# Patient Record
Sex: Female | Born: 1935 | Race: White | Hispanic: No | Smoking: Never smoker
Health system: Southern US, Community
[De-identification: ages and names within clinical notes are randomized; demographics above are authoritative.]

## PROBLEM LIST (undated history)

## (undated) DIAGNOSIS — D171 Benign lipomatous neoplasm of skin and subcutaneous tissue of trunk: Secondary | ICD-10-CM

## (undated) DIAGNOSIS — I639 Cerebral infarction, unspecified: Secondary | ICD-10-CM

## (undated) DIAGNOSIS — I10 Essential (primary) hypertension: Secondary | ICD-10-CM

## (undated) DIAGNOSIS — K635 Polyp of colon: Secondary | ICD-10-CM

## (undated) DIAGNOSIS — J45909 Unspecified asthma, uncomplicated: Secondary | ICD-10-CM

## (undated) DIAGNOSIS — E079 Disorder of thyroid, unspecified: Secondary | ICD-10-CM

## (undated) DIAGNOSIS — F0390 Unspecified dementia without behavioral disturbance: Secondary | ICD-10-CM

## (undated) DIAGNOSIS — E119 Type 2 diabetes mellitus without complications: Secondary | ICD-10-CM

## (undated) DIAGNOSIS — E43 Unspecified severe protein-calorie malnutrition: Secondary | ICD-10-CM

## (undated) DIAGNOSIS — Z800 Family history of malignant neoplasm of digestive organs: Secondary | ICD-10-CM

## (undated) DIAGNOSIS — R601 Generalized edema: Secondary | ICD-10-CM

## (undated) DIAGNOSIS — R609 Edema, unspecified: Secondary | ICD-10-CM

## (undated) DIAGNOSIS — K439 Ventral hernia without obstruction or gangrene: Secondary | ICD-10-CM

## (undated) DIAGNOSIS — H9209 Otalgia, unspecified ear: Secondary | ICD-10-CM

## (undated) DIAGNOSIS — I2510 Atherosclerotic heart disease of native coronary artery without angina pectoris: Secondary | ICD-10-CM

## (undated) DIAGNOSIS — I6529 Occlusion and stenosis of unspecified carotid artery: Secondary | ICD-10-CM

## (undated) DIAGNOSIS — I6523 Occlusion and stenosis of bilateral carotid arteries: Secondary | ICD-10-CM

## (undated) DIAGNOSIS — R079 Chest pain, unspecified: Secondary | ICD-10-CM

## (undated) HISTORY — PX: CAROTID ARTERY ANGIOPLASTY: SHX1300

## (undated) HISTORY — PX: CHOLECYSTECTOMY: SHX55

## (undated) HISTORY — PX: CARPAL TUNNEL RELEASE: SHX101

## (undated) HISTORY — PX: HEMORRHOID SURGERY: SHX153

## (undated) HISTORY — PX: LIPOMA EXCISION: SHX5283

---

## 2009-07-12 LAB — GLUCOSE TOLERANCE TEST: Glucose Accuchek: 123 MG/DL

## 2009-12-16 ENCOUNTER — Encounter

## 2009-12-16 NOTE — Patient Instructions (Signed)
Schedule CT abd/pelvis - acute pain    CMP, Amylase/Lipase today    Follow - up with THeath NP 2-3 days after ct

## 2009-12-16 NOTE — Progress Notes (Signed)
Subjective:      Patient ID: Valerie Nicholson is a 76 y.o. female.    HPI  Patient seen for complaints of abdominal pain, acute and generalized.  She also reports "hearing" her abdomen.  Last c-scope 07/12/09 revealed sigmoid diverticulosis, she has a history of carpeting polyp mid ascending colon without recurrence.  Last endo 12/31/08 revealed negative urea, gastritis and continued known Barrett's disease, short segment.    Family history: grandfather, mother, brother colon cancer    Review of Systems   Constitutional: Positive for appetite change. Negative for fever, chills, activity change, fatigue and unexpected weight change.   HENT: Negative for hearing loss, sore throat, rhinorrhea, trouble swallowing, dental problem and sinus pressure.    Eyes: Negative for visual disturbance.   Respiratory: Negative for cough, chest tightness, shortness of breath and wheezing.    Cardiovascular: Negative for chest pain, palpitations and leg swelling.   Gastrointestinal: Positive for abdominal pain (generalized, acute; worse after eating). Negative for nausea, vomiting, diarrhea, constipation, blood in stool, abdominal distention and anal bleeding.   Genitourinary: Negative for dysuria, urgency, frequency, hematuria and difficulty urinating.   Musculoskeletal: Positive for back pain, arthralgias and gait problem. Negative for myalgias and joint swelling.   Skin: Negative for color change and rash.   Neurological: Negative for dizziness, tremors, seizures, weakness, numbness and headaches.   Hematological: Does not bruise/bleed easily.   Psychiatric/Behavioral: Negative for confusion. The patient is not nervous/anxious.        Objective:   Physical Exam   Constitutional: She is oriented to person, place, and time. Vital signs are normal. She appears well-developed and well-nourished. She is cooperative. No distress.   HENT:   Head: Normocephalic and atraumatic.   Eyes: Conjunctivae and EOM are normal. Pupils are equal, round,  and reactive to light. No scleral icterus.   Neck: Trachea normal and normal range of motion. Neck supple. No JVD present.   Cardiovascular: Normal rate, regular rhythm, normal heart sounds, intact distal pulses and normal pulses.  Exam reveals no gallop.    No murmur heard.  Pulmonary/Chest: Effort normal and breath sounds normal. No respiratory distress. She has no wheezes. She has no rales.   Abdominal: Soft. Bowel sounds are normal. She exhibits no distension and no mass. There is no splenomegaly or hepatomegaly. Tenderness (diffuse) is present. She has no rebound and no guarding.   Musculoskeletal: Normal range of motion. She exhibits no edema.        Walks with walker   Neurological: She is alert and oriented to person, place, and time.   Skin: Skin is warm, dry and intact. No lesion and no rash noted. She is not diaphoretic.   Psychiatric: She has a normal mood and affect. Her speech is normal and behavior is normal. Her mood appears not anxious.       Assessment:      1. Abdominal pain, acute, generalized            Plan:      Schedule CT abd/pelvis - acute pain    CMP, Amylase/Lipase today    Follow - up with THeath NP 2-3 days after ct

## 2009-12-19 LAB — LIPASE: Lipase: 28.9 U/L (ref 12.0–53.0)

## 2009-12-19 LAB — COMPREHENSIVE PANEL
ALT: 15 IU/L (ref 7–35)
AST: 21 IU/L (ref 8–41)
Albumin: 4.2 G/DL (ref 3.4–4.8)
Alk Phos: 97 IU/L (ref 18–125)
Anion Gap: 8 MEQ/L (ref 7–19)
BUN: 12 MG/DL (ref 6–20)
CO2: 30 MEQ/L — ABNORMAL HIGH (ref 23–29)
Calcium: 8.3 MG/DL — ABNORMAL LOW (ref 8.5–10.3)
Chloride: 99 MEQ/L (ref 98–111)
Creatinine: 0.8 MG/DL (ref 0.3–1.3)
Gfr Calculated: 74 mL/min (ref >59)
Glucose: 141 MG/DL
Potassium: 4.2 mEq/L (ref 3.5–5.0)
Sodium: 137 MEQ/L (ref 136–145)
Total Bilirubin: 0.5 MG/DL (ref 0.3–1.2)
Total Protein: 7.4 G/DL (ref 6.4–8.3)

## 2009-12-19 LAB — CREATININE, BODY FLUID: Creatinine: 0.8 mg/dL (ref 0.3–1.3)

## 2009-12-19 LAB — AMYLASE: Amylase: 67 U/L (ref 20–104)

## 2009-12-21 LAB — CREATININE: Creatinine: 0.7 MG/DL (ref 0.3–1.3)

## 2009-12-21 LAB — BUN: BUN: 10 MG/DL (ref 6–20)

## 2009-12-23 NOTE — Patient Instructions (Addendum)
Refer to surgeon - cholelithiasis and 17cm ventral hernia, patient prefers Dr. Dimas Aguas    Colon recall 06/2012    Endo recall 2013    Encouraged the use of Miralax, increase water and fiber intake    Call with problems before then

## 2009-12-23 NOTE — Progress Notes (Signed)
Subjective:      Patient ID: Valerie Nicholson is a 76 y.o. female.    HPI  Patient seen for complaints of abdominal pain, acute and generalized.  Patient has stopped taking Nexium as she feels it is related to her abdominal pain.  She does have Barrett's esophagus, explained to patient the importance of continuing PPI, she voiced understanding.  She also complains of constipation.  Last c-scope 07/12/09 revealed sigmoid diverticulosis, she has a history of carpeting polyp mid ascending colon without recurrence, 3 year recall recommended. Last endo 12/31/08 revealed negative urea, gastritis and continued known Barrett's disease, short segment, 3 year recall recommended.    CT abd/pelvis 12/19/09 revealed cholelithiasis and a large ventral hernia, 17cm.  CMP revealed low calcium 8.3, the remainder wnl, amylase/lipase normal.    Family history: mother, brothers x 2, grandfather colon cancer    Review of Systems   Constitutional: Negative for fever, chills, activity change, appetite change, fatigue and unexpected weight change.   HENT: Negative for hearing loss, sore throat, rhinorrhea, trouble swallowing, dental problem and sinus pressure.    Eyes: Negative for visual disturbance.   Respiratory: Negative for cough, chest tightness, shortness of breath and wheezing.    Cardiovascular: Negative for chest pain, palpitations and leg swelling.   Gastrointestinal: Positive for abdominal pain and constipation. Negative for nausea, vomiting, diarrhea, blood in stool, abdominal distention and anal bleeding.   Genitourinary: Negative for dysuria, urgency, frequency, hematuria and difficulty urinating.   Musculoskeletal: Positive for back pain and gait problem. Negative for myalgias, joint swelling and arthralgias.   Skin: Negative for color change and rash.   Neurological: Positive for weakness. Negative for dizziness, tremors, seizures, numbness and headaches.   Hematological: Does not bruise/bleed easily.   Psychiatric/Behavioral:  Negative for confusion. The patient is nervous/anxious.        Objective:   Physical Exam   Constitutional: She is oriented to person, place, and time. Vital signs are normal. She appears well-developed and well-nourished. She is cooperative. No distress.        obese   HENT:   Head: Normocephalic and atraumatic.   Eyes: Conjunctivae and EOM are normal. Pupils are equal, round, and reactive to light. No scleral icterus.   Neck: Trachea normal and normal range of motion. Neck supple. No JVD present.   Cardiovascular: Normal rate, regular rhythm, normal heart sounds, intact distal pulses and normal pulses.  Exam reveals no gallop.    No murmur heard.  Pulmonary/Chest: Effort normal and breath sounds normal. No respiratory distress. She has no wheezes. She has no rales.   Abdominal: Soft. Bowel sounds are normal. She exhibits no distension and no mass. There is no splenomegaly or hepatomegaly. No tenderness. She has no rebound and no guarding.   Musculoskeletal: Normal range of motion. She exhibits no edema.        Uses a walker to ambulate   Neurological: She is alert and oriented to person, place, and time.   Skin: Skin is warm, dry and intact. No lesion and no rash noted. She is not diaphoretic.   Psychiatric: She has a normal mood and affect. Her speech is normal and behavior is normal. Her mood appears not anxious.        Extreme concern regarding possibility of colon cancer, due to family history.       Assessment:      1. Abdominal pain    2. Cholelithiasis    3. Barrett esophagus  4.   Constipation        Plan:      Refer to surgeon - cholelithiasis and 17cm ventral hernia, patient prefers Dr. Dimas Aguas    Colon recall 06/2012    Endo recall 2013    Encouraged the use of Miralax, increase water and fiber intake    Call with problems before then

## 2009-12-28 NOTE — Telephone Encounter (Signed)
Patient called Dr Penni Bombard office, referral not done.      Will schedule referral asap for cholelithiasis

## 2010-01-11 NOTE — Progress Notes (Signed)
Subjective:      Patient ID: Valerie Nicholson is a 76 y.o. female.    HPI  [name] is a pleasant 76 year old female who is referred by Dr Beckey Downing for surgical of gallbladder disease.  She presents with symptomatic gallbladder disease.  She has had a CT scan that confirmed gallstones and a 7cm umbilical hernia.  She has a host of medical problems to include heart and lung disease.  We will ask her cardiologist Dr. Chase Picket to see her and also refer her to Dr Estanislado Pandy for pulmonary evaluation.      Patient Active Problem List   Diagnoses Date Noted   . Family history of colon cancer 12/16/2009   . Abdominal pain, acute, generalized 12/16/2009     No current outpatient prescriptions on file.     Allergies: Darvocet; Meloxicam; Morphine; Pcn; Septra; Ultracet; Zanaflex; and Fentanyl    Past Medical History   Diagnosis Date   . Lipoma    . Colon polyps    . Hyperthyroidism    . HH (hiatus hernia)    . HYPERTENSION    . Barrett's esophagus    . Allergic rhinitis    . DM w/o complication type II    . Osteoarthritis    . TIA (transient ischemic attack)    . Asthma    . Obesity    . Spinal stenosis      Past Surgical History   Procedure Date   . Carpal tunnel release    . Hemorrhoid surgery    . Knee arthroscopy      BILATERAL   . Cataract removal    . Colonoscopy 06/2009   . Upper gastrointestinal endoscopy 12/2008     Family History   Problem Relation Age of Onset   . Cancer Mother    . Colon Cancer Mother    . Cancer Brother    . Colon Cancer Brother    . Cancer Maternal Aunt    . Emphysema Father    . Heart Failure Father      History   Substance Use Topics   . Smoking status: Former Games developer   . Smokeless tobacco: Not on file    Comment: Stopped 1955   . Alcohol Use: No       Review of Systems   Constitutional: Positive for appetite change and unexpected weight change. Negative for fever, chills and diaphoresis.   HENT: Negative for hearing loss, nosebleeds, facial swelling, sneezing, neck pain and tinnitus.     Eyes: Negative for photophobia and discharge.   Respiratory:        [Positive for asthma and she sees Dr Luciana Axe.  Cardiovascular:        [Positive for heart disease under the care of Dr Chase Picket.  Neurological: Negative for dizziness, seizures, light-headedness and headaches.         Objective:   Physical Exam   Constitutional: No distress.   HENT:   Head: Normocephalic.   Eyes: No scleral icterus.   Neck: Neck supple. No JVD present.   Cardiovascular: Normal rate and regular rhythm.    Pulmonary/Chest: Breath sounds normal. No respiratory distress. She exhibits no tenderness.   Abdominal: Soft.        Positive for a reducible umbilical hernia.   Musculoskeletal: Normal range of motion. She exhibits no edema and no tenderness.   Lymphadenopathy:     She has no cervical adenopathy.       Assessment:  Cholecystitis and cholelithiasis      Plan:      We will get her into see cardiology and pulmonary prior to surgery.

## 2010-01-12 ENCOUNTER — Encounter

## 2010-01-12 NOTE — Telephone Encounter (Signed)
01/12/10 9:24am-Spoke w/Jeri w/ Dr Chase Picket.  Requested cardiac clearance prior to surgery.  She said she would send request to the nurse and they would contact our office.  I also left a message with Dr Landry Dyke appt sec requesting a new pt appt for pulmonary clearance prior to surgery.

## 2010-01-18 LAB — BLOOD GAS, ARTERIAL
Base: 2.8 — ABNORMAL HIGH (ref <=2.0)
CALCULATED TOTAL (E+NE): 12 g/dL (ref 12.0–16.0)
Carboxyhemoglobin: 2.2 % (ref 0–5)
FIO2: 0.21
HCO3: 27.2 mM/L — ABNORMAL HIGH (ref 22–26)
Met HB: 0.6 % (ref 0–3)
O2 Sat: 16 % — ABNORMAL LOW (ref 17–21)
PCO2: 40 mmHg (ref 35–48)
Ph: 7.44 (ref 7.35–7.45)
Potassium, Bld: 3.8 mEq/L (ref 3.5–5.0)
Rate: 20
Sat: 95.8 % (ref 90–100)
pO2: 75 mmHg — ABNORMAL LOW (ref 83–108)

## 2010-01-24 NOTE — Progress Notes (Signed)
[facility], [place]  [address], [place]  [address]  Phone: [phone]  Fax: [phone]    OFFICE VISIT:  01/24/2010    Valerie Nicholson - DOB: 12/23/1930    Reason For Visit:  Valerie Nicholson is a 76 y.o. female who is here for Surgical Consult      Subjective  Valerie Nicholson denies exertional chest pain but states she still has her "spot" in her chest that hurts when she is upset.  She has shortness of breath that is better.  Uses mechanical bed but not orthopnea, paroxysmal nocturnal dyspnea, syncope, presyncope, arrythmia, edema and fatigue.  The patient denies numbness or weakness to suggest cerebrovascular accident or transient ischemic attack.  She is having daily abdominal pain.    Valerie Nicholson has the following history as recorded in [employer]:    Patient Active Problem List   Diagnoses Date Noted   . Aortic stenosis 01/24/2010   . HYPERTENSION    . DM w/o complication type II    . Family history of colon cancer 12/16/2009   . Abdominal pain, acute, generalized 12/16/2009     Past Medical History   Diagnosis Date   . Lipoma    . Colon polyps    . Hyperthyroidism    . HH (hiatus hernia)    . HYPERTENSION    . Barrett's esophagus    . Allergic rhinitis    . DM w/o complication type II    . Osteoarthritis    . TIA (transient ischemic attack)    . Asthma    . Obesity    . Spinal stenosis    . Aortic stenosis 01/24/2010     Past Surgical History   Procedure Date   . Carpal tunnel release    . Hemorrhoid surgery    . Knee arthroscopy      BILATERAL   . Cataract removal    . Colonoscopy 06/2009   . Upper gastrointestinal endoscopy 12/2008     Family History   Problem Relation Age of Onset   . Cancer Mother    . Colon Cancer Mother    . Cancer Brother    . Colon Cancer Brother    . Cancer Maternal Aunt    . Emphysema Father    . Heart Failure Father      History   Substance Use Topics   . Smoking status: Former Games developer   . Smokeless tobacco: Not on file    Comment: Stopped 1955   . Alcohol  Use: No      Current Outpatient Prescriptions   Medication Sig Dispense Refill   . budesonide-formoterol (SYMBICORT) 160-4.5 MCG/ACT AERO Inhale 2 puffs into the lungs 2 times daily.         Marland Kitchen esomeprazole Magnesium (NEXIUM) 40 MG PACK 40 mg daily.         Marland Kitchen GLIPIZIDE PO Take 5 mg by mouth 2 times daily.       . ATENOLOL PO Take 50 mg by mouth 2 times daily.       . Olmesartan Medoxomil (BENICAR PO) Take  by mouth.         . Clopidogrel Bisulfate (PLAVIX PO) Take 75 mg by mouth daily.       . Atorvastatin Calcium (LIPITOR PO) Take  by mouth.         . Bumetanide (BUMEX PO) Take  by mouth.         Marland Kitchen  Montelukast Sodium (SINGULAIR PO) Take  by mouth.         . METFORMIN HCL PO Take  by mouth.         . Methimazole (TAPAZOLE PO) Take  by mouth.         . Loratadine (CLARITIN PO) Take  by mouth.         . ASPIRIN PO Take 81 mg by mouth daily.       Marland Kitchen CALCIUM PO Take  by mouth.         . Multiple Vitamin (MULTIVITAMIN PO) Take  by mouth.         . Pirbuterol Acetate (MAXAIR AUTOHALER IN) Inhale  into the lungs as needed.       . Olopatadine HCl (PATANASE NA) by Nasal route.           Allergies: Darvocet; Meloxicam; Morphine; Pcn; Septra; Ultracet; Zanaflex; and Fentanyl    Objective  Vital Signs - BP 168/80  Pulse 64  Ht 5' (1.524 m)  Wt 212 lb (96.163 kg)  BMI 41.40 kg/m2  General - Valerie Nicholson is alert, cooperative, and pleasant.  No acute distress.   Obese, using walker   HEENT - The head is normocephalic.  Neck - Supple, without increased jugular venous pressures.  No carotid bruits.    Lungs - Lungs are clear bilaterally.  No wheezes or rales.    Cardiovascular - Heart has regular rate and rhythm.  No murmurs, rubs or gallops.      Extremities - No clubbing, cyanosis, or edema.   Musculoskeletal - No musculoskeletal symptoms.     Neurological - No focal signs are identified. Cranial nerves II through XII are grossly intact.    Assessment:    1. Aortic stenosis  Echocardiogram pharmacological stress test, Echo Complete    2. HYPERTENSION  Echocardiogram pharmacological stress test   3. DM w/o complication type II  Echocardiogram pharmacological stress test    Stable cardiovascular status. No evidence of overt heart failure, angina or dysrhythmia.   Needing surgical clearance for upcoming hernia and cholecystectomy - needs soon.    Plan  1. Dobutamine stress echo and 2D echo set for 01-27-10 at [facility]  2. Cardiac clearance pending results.  3. We will send results to PCP and Dr. Dimas Aguas  4. Return to see Dr. Chase Picket in 6 months.  5. Continue same medications.  6. Call with any problems, questions or concerns.  7. Continue to see primary care provider for non cardiac concerns and labs.  8.     Valerie Bruckner, APRN

## 2010-01-24 NOTE — Patient Instructions (Addendum)
Tests to be done at [facility] on Friday 01-27-10 at 8AM  [facility] at 7:30 to register  Nothing to eat or drink after midnight  Take normal medications with water. HOLD the ATENOLOL that morning however.   We will send results to Dr. Dimas Aguas and your primary doctor.     We will see you back in May as scheduled with Dr. Chase Picket  Continue same medications  Call for problems or concerns.

## 2010-01-30 NOTE — Telephone Encounter (Signed)
Valerie Nicholson calling to say she had a chemical stress test Friday.  Friday night she noted a red spot on her left hand below the ring finger and put hydrocortisone cream on it.  Later she had left arm and shoulder pain.  Saturday her right arm began to hurt and up into her neck and Sunday it was in both arms and began to have constant throbbing in neck and head.  Denies having elevated temperature and states she has sharp pain in neck and head when she tries to move it.  Advised her to see her family physician.  Voiced understanding.

## 2010-02-01 NOTE — Progress Notes (Signed)
Patient ID: Valerie Nicholson is a 76 y.o. female.   HPI   Valerie Nicholson is a pleasant 76 year old female who is referred by Dr Beckey Downing for surgical evaluation of gallbladder disease. She has seen Dr Tresa Endo and Dr Chase Picket preop and she has been cleared for surgery.  We discussed the risk, benefits and alternatives of surgery and she wishes to proceed.     Patient Active Problem List    Diagnoses  Date Noted    .  Family history of colon cancer  12/16/2009    .  Abdominal pain, acute, generalized  12/16/2009      No current outpatient prescriptions on file.      Allergies: Darvocet; Meloxicam; Morphine; Pcn; Septra; Ultracet; Zanaflex; and Fentanyl     Past Medical History    Diagnosis  Date    .  Lipoma     .  Colon polyps     .  Hyperthyroidism     .  HH (hiatus hernia)     .  HYPERTENSION     .  Barrett's esophagus     .  Allergic rhinitis     .  DM w/o complication type II     .  Osteoarthritis     .  TIA (transient ischemic attack)     .  Asthma     .  Obesity     .  Spinal stenosis       Past Surgical History    Procedure  Date    .  Carpal tunnel release     .  Hemorrhoid surgery     .  Knee arthroscopy       BILATERAL    .  Cataract removal     .  Colonoscopy  06/2009    .  Upper gastrointestinal endoscopy  12/2008      Family History    Problem  Relation  Age of Onset    .  Cancer  Mother     .  Colon Cancer  Mother     .  Cancer  Brother     .  Colon Cancer  Brother     .  Cancer  Maternal Aunt     .  Emphysema  Father     .  Heart Failure  Father       History    Substance Use Topics    .  Smoking status:  Former Games developer    .  Smokeless tobacco:  Not on file       Comment: Stopped 1955      .  Alcohol Use:  No      Review of Systems   Constitutional: Positive for appetite change and unexpected weight change. Negative for fever, chills and diaphoresis.   HENT: Negative for hearing loss, nosebleeds, facial swelling, sneezing, neck pain and tinnitus.   Eyes: Negative for photophobia and discharge.   Respiratory:    [Positive for asthma and she sees Dr Luciana Axe- cleared for surgery per Dr Tresa Endo.   Cardiovascular:   [Positive for heart disease under the care of Dr Ellin Saba recent work up   Neurological: Negative for dizziness, seizures, light-headedness and headaches.     Objective:    Physical Exam   Constitutional: No distress.   HENT:   Head: Normocephalic.   Eyes: No scleral icterus.   Neck: Neck supple. No JVD present.   Cardiovascular: Normal rate and regular rhythm.   Pulmonary/Chest: Breath  sounds normal. No respiratory distress. She exhibits no tenderness.   Abdominal: Soft.   Positive for a reducible umbilical hernia.   Musculoskeletal: Normal range of motion. She exhibits no edema and no tenderness.   Lymphadenopathy:   She has no cervical adenopathy.     Assessment:      Cholecystitis and cholelithiasis   Umbilical hernia    Plan:      We will schedule her for surgery on 02/14/10.

## 2010-02-07 LAB — COMPREHENSIVE METABOLIC PANEL
ALT: 14 IU/L (ref 7–35)
AST: 23 IU/L (ref 8–41)
Albumin: 4.2 G/DL (ref 3.4–4.8)
Alkaline Phosphatase: 87 IU/L (ref 18–125)
Anion Gap: 9 MEQ/L (ref 7–19)
BUN: 12 MG/DL (ref 6–20)
CO2: 29 MEQ/L (ref 23–29)
Calcium: 8.9 MG/DL (ref 8.5–10.3)
Chloride: 99 MEQ/L (ref 98–111)
Creatinine: 0.7 MG/DL (ref 0.3–1.3)
Est, Glom Filt Rate: 86 mL/min (ref >59)
Glucose: 158 MG/DL
Potassium, Fluid: 4.6 MEQ/L (ref 3.6–5.0)
Sodium: 137 MEQ/L (ref 136–145)
Total Bilirubin: 0.5 MG/DL (ref 0.3–1.2)
Total Protein: 7.4 G/DL (ref 6.4–8.3)

## 2010-02-07 LAB — CBC WITH AUTO DIFFERENTIAL
Basophils %: 0.6 % (ref 0–1)
Basophils: 0.04 10*3/uL (ref 0.0–0.20)
Eosinophils %: 0.32 10*3/uL (ref 0.0–0.60)
Eosinophils %: 5 % (ref 1–5)
Hematocrit: 36.4 % — ABNORMAL LOW (ref 37–47)
Hemoglobin: 11.9 G/DL — ABNORMAL LOW (ref 12.0–16.0)
Lymphocytes: 2.1 10*3/uL (ref 1.1–4.5)
Lymphocytes: 32.7 % (ref 20–40)
MCH: 28 PG (ref 27–31)
MCHC: 32.7 G/DL — ABNORMAL LOW (ref 33–37)
MCV: 85.6 FL (ref 81–99)
MPV: 10.1 FL — ABNORMAL LOW (ref 10.2–13.2)
Monocytes %: 7.3 % (ref 0–10)
Monocytes: 0.47 10*3/uL (ref 0.0–0.9)
Neutrophils %: 54.4 % (ref 50–70)
Neutrophils Absolute: 3.5 10*3/uL (ref 1.5–7.5)
Platelet Count: 313 (ref 130–400)
RBC: 4.25 MIL/uL (ref 4.2–5.4)
RDW: 13.3 % (ref 11.0–14.0)
WBC: 6.43 10*3/uL (ref 4.8–10.8)

## 2010-02-08 ENCOUNTER — Encounter

## 2010-02-08 MED ORDER — MUPIROCIN 2 % EX OINT
2 % | CUTANEOUS | Status: AC
Start: 2010-02-08 — End: 2010-02-15

## 2010-02-14 ENCOUNTER — Inpatient Hospital Stay: Admit: 2010-02-14 | Disposition: A | Discharge status: Home / Self Care | Attending: Surgery | Admitting: Surgery

## 2010-02-14 LAB — GLUCOSE TOLERANCE TEST
Glucose Accuchek: 131 MG/DL
Glucose Accuchek: 199 MG/DL
Glucose Accuchek: 213 MG/DL

## 2010-02-15 LAB — GLUCOSE TOLERANCE TEST
Glucose Accuchek: 140 MG/DL
Glucose Accuchek: 145 MG/DL

## 2010-02-15 LAB — CBC PANEL ITEM
Basophils %: 0.1 % (ref 0–1)
Basophils: 0.01 10*3/uL (ref 0.0–0.20)
Eosinophils %: 0.29 10*3/uL (ref 0.0–0.60)
Eosinophils %: 3.3 % (ref 1–5)
Hematocrit: 29.1 % — ABNORMAL LOW (ref 37–47)
Hgb: 9.6 G/DL — ABNORMAL LOW (ref 12.0–16.0)
Lymphocytes: 1.69 10*3/uL (ref 1.1–4.5)
Lymphocytes: 19.3 % — ABNORMAL LOW (ref 20–40)
MCH: 27.8 PG (ref 27–31)
MCHC: 33 G/DL (ref 33–37)
MCV: 84.3 FL (ref 81–99)
MPV: 10.1 FL — ABNORMAL LOW (ref 10.2–13.2)
Monocytes %: 9.3 % (ref 0–10)
Monocytes: 0.81 10*3/uL (ref 0.0–0.9)
Neutrophils %: 68 % (ref 50–70)
Neutrophils Absolute: 5.94 10*3/uL (ref 1.5–7.5)
Platelet Count: 212 (ref 130–400)
RBC: 3.45 MIL/uL — ABNORMAL LOW (ref 4.2–5.4)
RDW: 13.8 % (ref 11.0–14.0)
WBC: 8.74 10*3/uL (ref 4.8–10.8)

## 2010-02-15 LAB — BASIC METABOLIC PANEL
Anion Gap: 5 MEQ/L — ABNORMAL LOW (ref 7–19)
BUN: 12 MG/DL (ref 6–20)
CO2: 30 MEQ/L — ABNORMAL HIGH (ref 23–29)
Calcium: 7.8 MG/DL — ABNORMAL LOW (ref 8.5–10.3)
Chloride: 96 MEQ/L — ABNORMAL LOW (ref 98–111)
Creatinine: 0.7 MG/DL (ref 0.3–1.3)
Gfr Calculated: 86 mL/min (ref >59)
Glucose: 155 MG/DL
Potassium: 3.9 mEq/L (ref 3.5–5.0)
Sodium: 131 MEQ/L — ABNORMAL LOW (ref 136–145)

## 2010-02-16 LAB — GLUCOSE TOLERANCE TEST
Glucose Accuchek: 186 MG/DL
Glucose Accuchek: 83 MG/DL

## 2010-03-02 NOTE — Progress Notes (Signed)
Valerie Nicholson presents after incisional hernia repair and laparoscopic cholecystectomy.  The  Patient is doing well without complaints.    On examination, her incisions are healing well and she has no complaints.  We removed her staples and steri-strips were applied to the midline incision.      IMPRESSION:  Doing well s/p incisional hernia repair and cholecystectomy    PLAN:  We will see her back in 2-3 weeks.

## 2010-03-03 NOTE — Telephone Encounter (Signed)
error 

## 2010-03-16 NOTE — Progress Notes (Signed)
HISTORY OF PRESENT ILLNESS:    Valerie Nicholson  is one month status post laparoscopic cholecystectomy and umbilical hernia.  She is doing well and not having any significant problems.  The wounds are healing nicely.  She is having no major problems with  bowel movements.  I will just need to see her  back as needed.  She can resume normal lifting at six weeks post operatively.

## 2010-03-17 ENCOUNTER — Encounter

## 2010-03-17 MED ORDER — OLMESARTAN MEDOXOMIL 40 MG PO TABS
40 MG | ORAL_TABLET | Freq: Every day | ORAL | Status: DC
Start: 2010-03-17 — End: 2010-08-09

## 2010-06-21 ENCOUNTER — Encounter

## 2010-08-09 ENCOUNTER — Encounter

## 2010-08-09 MED ORDER — OLMESARTAN MEDOXOMIL 40 MG PO TABS
40 MG | ORAL_TABLET | Freq: Every day | ORAL | Status: DC
Start: 2010-08-09 — End: 2010-11-15

## 2010-09-06 NOTE — Patient Instructions (Signed)
Return for wellness visit in six months and return to see me in a year.  Continue your current medications.  Call our office should you have any concerns or cardiac problems prior to your next appointment.    Judith Blonder. Chase Picket, M.D., Ph.D., F.A.C.C.

## 2010-09-08 NOTE — Progress Notes (Signed)
[facility]  [facility]  [address], [place]  [address]  Phone: [phone]  Fax: [phone]    OFFICE VISIT:  09/06/10    Valerie Nicholson - 77 y.o. [birth year]    Chief Complaint   Patient presents with   . 6 Month Follow-Up     s/p tumor removal and cholecystectomy last October.  Concerned about area on lower leg.  Has occ. sharp pain left chest that she feels can't move with , has had for years but has worsened over the last month.        Subjective:   Valerie Nicholson presents today for routine follow-up.  She is doing okay.  There is no angina, no overt heart failure, no syncope, and no stroke-like problems.      Patient Active Problem List   Diagnoses Date Noted   . Aortic stenosis 01/24/2010   . HYPERTENSION    . DM w/o complication type II    . Family history of colon cancer 12/16/2009   . Abdominal pain, acute, generalized 12/16/2009       Current Outpatient Prescriptions   Medication Sig Dispense Refill   . olmesartan (BENICAR) 40 MG tablet Take 1 tablet by mouth daily.  90 tablet  3   . budesonide-formoterol (SYMBICORT) 160-4.5 MCG/ACT AERO Inhale 2 puffs into the lungs 2 times daily.         Marland Kitchen esomeprazole Magnesium (NEXIUM) 40 MG PACK 40 mg daily.         Marland Kitchen GLIPIZIDE PO Take 5 mg by mouth 2 times daily.       . ATENOLOL PO Take 50 mg by mouth 2 times daily.       Marland Kitchen Clopidogrel Bisulfate (PLAVIX PO) Take 75 mg by mouth daily.       . Atorvastatin Calcium (LIPITOR PO) Take 40 mg by mouth.       . Bumetanide (BUMEX PO) Take 5 mg by mouth.       . Montelukast Sodium (SINGULAIR PO) Take  by mouth.         . METFORMIN HCL PO Take 500 mg by mouth.       . Methimazole (TAPAZOLE PO) Take  by mouth.         . Loratadine (CLARITIN PO) Take  by mouth.         . ASPIRIN PO Take 81 mg by mouth daily.       Marland Kitchen CALCIUM PO Take  by mouth.         . Multiple Vitamin (MULTIVITAMIN PO) Take  by mouth.         . Pirbuterol Acetate (MAXAIR AUTOHALER IN) Inhale   into the lungs as needed.       . Olopatadine HCl (PATANASE NA) by Nasal route.             Allergies: Darvocet; Meloxicam; Morphine; Pcn; Septra; Ultracet; Zanaflex; and Fentanyl      Past Medical History   Diagnosis Date   . Lipoma    . Colon polyps    . Hyperthyroidism    . HH (hiatus hernia)    . Hypertension    . Barrett's esophagus    . Allergic rhinitis    . Type II or unspecified type diabetes mellitus without mention of complication, not stated as uncontrolled    . Osteoarthritis    . TIA (transient ischemic attack)    . Asthma    .  Obesity    . Spinal stenosis    . Aortic stenosis 01/24/2010   . Shortness of breath    . Chest pain    . Transient cerebral ischemia        Past Surgical History   Procedure Date   . Carpal tunnel release    . Hemorrhoid surgery    . Knee arthroscopy      BILATERAL   . Cataract removal    . Colonoscopy 06/2009   . Upper gastrointestinal endoscopy 12/2008   . Appendectomy    . Nasal septum surgery      repair   . Cholecystectomy        Family History   Problem Relation Age of Onset   . Cancer Mother    . Colon Cancer Mother    . Cancer Brother    . Colon Cancer Brother    . Cancer Maternal Aunt    . Emphysema Father    . Heart Failure Father        History   Substance Use Topics   . Smoking status: Former Games developer   . Smokeless tobacco: Not on file    Comment: Stopped 1955   . Alcohol Use: No       Objective:   BP 132/70  Pulse 60  Ht 5' (1.524 m)  Wt 207 lb (93.895 kg)  BMI 40.43 kg/m2  General - Valerie Nicholson is alert, cooperative, and pleasant.  No apparent distress.      HEENT - The head is normocephalic.  Neck - The neck is supple without increased jugular venous pressures.  No carotid bruits.    Lungs - The lungs are clear.      Cardiovascular - The rhythm is regular without appreciated murmur or rub.      Abdominal -  The abdomen is soft, non-tender, non-distended.  Bowel sounds are intact.   Extremities - There is no clubbing, cyanosis, or edema.   Musculoskeletal - No  musculoskeletal symptoms.     Neurological - No focal signs are identified.  Cranial nerves II through XII are grossly intact.    Assessment:   Stable cardiovascular status.     Plan:   1.  Continue same medications.   2.  Return for wellness visit in six months and see me in a year.  3.  She is to call our office should she have any concerns or cardiac problems prior to her next appointment.    Valerie Blonder. Chase Nicholson, M.D., Ph.D., F.A.C.C.    Cc:   Valerie Nicholson  [address]  [place] [address]  [phone] office  [phone] fax

## 2010-11-15 ENCOUNTER — Encounter

## 2010-11-15 MED ORDER — OLMESARTAN MEDOXOMIL 40 MG PO TABS
40 MG | ORAL_TABLET | Freq: Every day | ORAL | Status: DC
Start: 2010-11-15 — End: 2011-03-01

## 2011-03-01 ENCOUNTER — Encounter

## 2011-03-01 MED ORDER — OLMESARTAN MEDOXOMIL 40 MG PO TABS
40 MG | ORAL_TABLET | Freq: Every day | ORAL | Status: DC
Start: 2011-03-01 — End: 2012-04-02

## 2011-03-15 NOTE — Patient Instructions (Signed)
Monitor BP occasional- overalll goal at rest <130/80    Use same scale at same time every day to monitor weight    Follow up in 6mos With Dr. Chase Picket  Call with any questions or concerns  Follow up with Dr. Loura Back for non cardiac problems and your labs  Report any new problems  Cardiovascular Fitness- beaas active as possible.   Cardiac / Healthy Diet  Continue current medications as directed  Continue plan of treatment

## 2011-03-15 NOTE — Progress Notes (Signed)
[facility] [place]  [address], [place]  [address]  Phone: [phone]  Fax: [phone]    OFFICE VISIT:  03/15/2011    Valerie Nicholson - DOB: 06-Dec-1930    Reason For Visit:  Valerie Nicholson is a 77 y.o. female who is here for 6 Month Follow-Up and Hypertension      Subjective  Valerie Nicholson denies exertional chest pain, shortness of breath, orthopnea, paroxysmal nocturnal dyspnea, syncope, presyncope, arrythmia, edema and fatigue.  The patient denies numbness or weakness to suggest cerebrovascular accident or transient ischemic attack.    She had thyroid  Ablation in Sept.  She sees Dr. Loura Back next week with labs in follow up.    She has dry eyes and hurt all the time.  She had one episode of visual changes one morning and continues for a while  BP has been more controlled - 134/82 last check.       Valerie Nicholson has the following history as recorded in EpicCare:    Patient Active Problem List   Diagnoses Date Noted   . Aortic stenosis 01/24/2010   . HYPERTENSION    . DM w/o complication type II    . Family history of colon cancer 12/16/2009   . Abdominal pain, acute, generalized 12/16/2009     Past Medical History   Diagnosis Date   . Lipoma    . Colon polyps    . Hyperthyroidism    . HH (hiatus hernia)    . Hypertension    . Barrett's esophagus    . Allergic rhinitis    . Type II or unspecified type diabetes mellitus without mention of complication, not stated as uncontrolled    . Osteoarthritis    . TIA (transient ischemic attack)    . Asthma    . Obesity    . Spinal stenosis    . Aortic stenosis 01/24/2010   . Shortness of breath    . Chest pain    . Transient cerebral ischemia      Past Surgical History   Procedure Date   . Carpal tunnel release    . Hemorrhoid surgery    . Knee arthroscopy      BILATERAL   . Cataract removal    . Colonoscopy 06/2009   . Upper gastrointestinal endoscopy 12/2008   . Appendectomy    . Nasal septum surgery      repair   . Cholecystectomy      Family  History   Problem Relation Age of Onset   . Cancer Mother    . Colon Cancer Mother    . Cancer Brother    . Colon Cancer Brother    . Cancer Maternal Aunt    . Emphysema Father    . Heart Failure Father      History   Substance Use Topics   . Smoking status: Former Games developer   . Smokeless tobacco: Not on file    Comment: Stopped 1955   . Alcohol Use: No      Current Outpatient Prescriptions   Medication Sig Dispense Refill   . magnesium oxide (MAG-OX) 400 MG tablet Take 400 mg by mouth 2 times daily.         Marland Kitchen olmesartan (BENICAR) 40 MG tablet Take 1 tablet by mouth daily.  30 tablet  5   . budesonide-formoterol (SYMBICORT) 160-4.5 MCG/ACT AERO Inhale 2 puffs into the lungs 2 times daily.         Marland Kitchen  esomeprazole Magnesium (NEXIUM) 40 MG PACK 40 mg daily.         Marland Kitchen GLIPIZIDE PO Take 5 mg by mouth 2 times daily.       . ATENOLOL PO Take 50 mg by mouth 2 times daily.       Marland Kitchen Clopidogrel Bisulfate (PLAVIX PO) Take 75 mg by mouth daily.       . Atorvastatin Calcium (LIPITOR PO) Take 40 mg by mouth.       . Bumetanide (BUMEX PO) Take 5 mg by mouth.       . Montelukast Sodium (SINGULAIR PO) Take  by mouth.         . METFORMIN HCL PO Take 500 mg by mouth.       . Methimazole (TAPAZOLE PO) Take  by mouth.         . Loratadine (CLARITIN PO) Take  by mouth.         . ASPIRIN PO Take 81 mg by mouth daily.       Marland Kitchen CALCIUM PO Take  by mouth.         . Multiple Vitamin (MULTIVITAMIN PO) Take  by mouth.         . Pirbuterol Acetate (MAXAIR AUTOHALER IN) Inhale  into the lungs as needed.       . Olopatadine HCl (PATANASE NA) by Nasal route.           Allergies: Darvocet; Meloxicam; Morphine; Pcn; Septra; Ultracet; Zanaflex; and Fentanyl    Objective  Vital Signs - BP 166/80  Pulse 62  Ht 5' (1.524 m)  Wt 215 lb (97.523 kg)  BMI 41.99 kg/m2  General - Valerie Nicholson is alert, cooperative, and pleasant.  No acute distress.      HEENT - The head is normocephalic.  Neck - Supple, without increased jugular venous pressures.  No carotid bruits.     Lungs - Lungs are clear bilaterally.  No wheezes or rales.    Cardiovascular - Heart has regular rhythm and rate.  No murmurs, rubs or gallops.      Abdominal -  Soft, nontender, nondistended.  Bowel sounds are intact.   Extremities - No clubbing, cyanosis, or  edema.   Musculoskeletal - No musculoskeletal symptoms.  Using walker with slow cautious gait.      Neurological - No focal signs are identified. Cranial nerves II through XII are grossly intact.    Assessment:    1. Hypertension    2. Aortic stenosis    3. DM w/o complication type II      Stable cardiovascular status. No evidence of overt heart failure, angina or dysrhythmia.   Negative Dobutamine SE 9/11.  Pinpoint sharp chest pain with [place] of inhaler non cardiac.   BP elevated today but reports normal at Dr. Blanchie Serve office.    Occasional dizziness- has not had carotid's checked- will check with Dr Loura Back to see if he has checked.   Plan  Monitor BP occasional- overalll goal at rest <130/80    Use same scale at same time every day to monitor weight    Follow up in 6mos With Dr. Chase Picket  Call with any questions or concerns  Follow up with Dr. Loura Back for non cardiac problems and your labs  Report any new problems  Cardiovascular Fitness- beaas active as possible.   Cardiac / Healthy Diet  Continue current medications as directed  Continue plan of treatment    Octavia Bruckner, APRN

## 2011-05-24 NOTE — Telephone Encounter (Signed)
The pt called wanting to know if there was anything she could take that would be cheaper than Benicar 40 mg.  Stated she was in the doughnut hole by May last year and was trying to avoid this.

## 2011-05-24 NOTE — Telephone Encounter (Signed)
She is on max of Benicar and others won't be as effective as the Benicar.  No samples right now but we can try to get her some.  If she has to switch due to cost, go to Diovan 320mg  - it is now generic.

## 2011-05-25 NOTE — Telephone Encounter (Signed)
Contacted the pt who stated she would stay on the Benicar at least 30 days and let us know if she wanted to change to the Diovan 320 after checking with the pharmacist on cost.

## 2011-05-31 LAB — CREATININE, BODY FLUID: Creatinine: 0.7 mg/dL (ref 0.3–1.3)

## 2011-06-11 NOTE — Telephone Encounter (Signed)
Valerie Nicholson called requesting results for tests done on 05/31/11, ordered by Dr. Verlin Dike.  Informed her that she would have to obtain those results from the ordering doctor.

## 2011-06-21 NOTE — Progress Notes (Addendum)
History and Physical    She presents for evaluation of carotid artery stenosis. She reports that she has been having neck pain and had a recent CTA neck.  Her current treatment includes ASA 81 mg po qd, clopidogrel 75 mg po qd.She denies a history of CVA. She reports no TIA's, episodes of amaurosis fugax and episodes of lateralizing weakness.    Valerie Nicholson is a 78 y.o. female with the following history reviewed and recorded in EpicCare:  Patient Active Problem List    Diagnosis Date Noted   . Carotid artery stenosis 06/21/2011   . Aortic stenosis 01/24/2010   . HYPERTENSION    . DM w/o complication type II    . Family history of colon cancer 12/16/2009     PT HAS STRONG FAMILY HISTORY OF CLN CA (MOM, GRANDFATHER, 2 BROTHERS, COUSINS SON)     . Abdominal pain, acute, generalized 12/16/2009     Current Outpatient Prescriptions   Medication Sig Dispense Refill   . magnesium oxide (MAG-OX) 400 MG tablet Take 400 mg by mouth 2 times daily.         Marland Kitchen olmesartan (BENICAR) 40 MG tablet Take 1 tablet by mouth daily.  30 tablet  5   . budesonide-formoterol (SYMBICORT) 160-4.5 MCG/ACT AERO Inhale 2 puffs into the lungs 2 times daily.         Marland Kitchen esomeprazole Magnesium (NEXIUM) 40 MG PACK 40 mg daily.         Marland Kitchen GLIPIZIDE PO Take 5 mg by mouth 2 times daily.       . ATENOLOL PO Take 50 mg by mouth 2 times daily.       Marland Kitchen Clopidogrel Bisulfate (PLAVIX PO) Take 75 mg by mouth daily.       . Atorvastatin Calcium (LIPITOR PO) Take 40 mg by mouth.       . Bumetanide (BUMEX PO) Take 5 mg by mouth.       . Montelukast Sodium (SINGULAIR PO) Take  by mouth.         . METFORMIN HCL PO Take 500 mg by mouth.       . Methimazole (TAPAZOLE PO) Take  by mouth.         . Loratadine (CLARITIN PO) Take  by mouth.         . ASPIRIN PO Take 81 mg by mouth daily.       Marland Kitchen CALCIUM PO Take  by mouth.         . Multiple Vitamin (MULTIVITAMIN PO) Take  by mouth.         . Pirbuterol Acetate (MAXAIR AUTOHALER IN) Inhale  into the lungs as needed.        . Olopatadine HCl (PATANASE NA) by Nasal route.           No current facility-administered medications for this visit.     Allergies: Darvocet; Meloxicam; Morphine; Pcn; Septra; Ultracet; Zanaflex; and Fentanyl  Past Medical History   Diagnosis Date   . Lipoma    . Colon polyps    . Hyperthyroidism    . HH (hiatus hernia)    . Hypertension    . Barrett's esophagus    . Allergic rhinitis    . Type II or unspecified type diabetes mellitus without mention of complication, not stated as uncontrolled    . Osteoarthritis    . TIA (transient ischemic attack)    . Asthma    . Obesity    .  Spinal stenosis    . Aortic stenosis 01/24/2010   . Shortness of breath    . Chest pain    . Transient cerebral ischemia    . Chronic back pain    . DVT (deep venous thrombosis)    . Hyperlipidemia    . Hypothyroidism    . Neuropathy    . Barrett esophagus    . Carotid artery stenosis 06/21/2011     Past Surgical History   Procedure Laterality Date   . Carpal tunnel release     . Hemorrhoid surgery     . Knee arthroscopy       BILATERAL   . Cataract removal     . Colonoscopy  06/2009   . Upper gastrointestinal endoscopy  12/2008   . Appendectomy     . Nasal septum surgery       repair   . Cholecystectomy       Family History   Problem Relation Age of Onset   . Cancer Mother    . Colon Cancer Mother    . Cancer Brother    . Colon Cancer Brother    . Cancer Maternal Aunt    . Emphysema Father    . Heart Failure Father      History   Substance Use Topics   . Smoking status: Former Smoker -- 1.00 packs/day for 20 years   . Smokeless tobacco: Never Used    Comment: Stopped 1955   . Alcohol Use: No         Old records have been obtained from the referring provider.  These records have been reviewed and summarized.      Review of Systems    Constitutional - no significant activity change, appetite change, or unexpected weight change.  No fever or chills.  No diaphoresis or significant fatigue.  HENT - no significant rhinorrhea or epistaxis.  No  tinnitus or significant hearing loss.  Eyes - no sudden vision change or amaurosis.  Respiratory - no significant shortness of breath, wheezing, or stridor.  No apnea, cough, or chest tightness associated with shortness of breath.  Cardiovascular - no chest pain, syncope, or significant dizziness.  No palpitations or significant leg swelling.  No claudication.  Gastrointestinal - no abdominal swelling or pain.  No blood in stool.  No severe constipation, diarrhea, nausea, or vomiting.  Genitourinary - No difficulty urinating, dysuria, frequency, or urgency.  No flank pain or hematuria.  Musculoskeletal - no back pain, gait disturbance, or myalgia.  Skin - no color change, rash, pallor, or new wound.  Neurologic - no dizziness, facial asymmetry, or light headedness.  No seizures.  No speech difficulty or lateralizing weakness.  Hematologic - no easy bruising or excessive bleeding.  Psychiatric - no severe anxiety or nervousness.  No confusion.  All other review of systems are negative.    Physical Exam    BP 198/90  Pulse 78  Temp(Src) 98.2 F (36.8 C)  Resp 16    Constitutional - well developed, well nourished.  No diaphoresis or acute distress.  HENT - head normocephalic.  Right external ear canal appears normal.  Left external ear canal appears normal.  Septum appears midline.  Eyes - conjunctiva normal.  EOM'S normal.  No exudate.  No icterus.  Neck- ROM appears normal, no tracheal deviation.  Cardiovascular - Regular rate and rhythm.  Heart sounds are normal.  No murmur, rub, or gallop.  Carotid pulses are 2+ to palpation bilaterally without  bruit.    Extremities - Radial and brachial pulses are 2+ to palpation bilaterally.  No cyanosis, clubbing, or significant edema.  No signs atheroembolic event.  Pulmonary - effort appears normal.  No respiratory distress.    Lungs - Breath sounds normal. No wheezes or rales.    GI - Abdomen - soft, non tender, bowel sounds X 4 quadrants.  No guarding or rebound  tenderness.  No distension or palpable mass.  Genitourinary - deferred.  Musculoskeletal - ROM appears normal.  No significant edema.  Neurologic - alert and oriented X 3.  Physiologic.  Tongue midline.  Face symmetric.  Skin - warm, dry, and intact.  No rash, erythema, or pallor.  Psychiatric - mood, affect, and behavior appear normal.  Judgment and thought processes appear normal.    Risk factors for atherosclerosis of all vascular beds have been reviewed with the patient including:  Family history, tobacco abuse in all forms, elevated cholesterol, hyperlipidemia, and diabetes.      Doppler results: 04/09/11    Right CCA/ICA <50% stenotic  Left CCA/ICA 50-69% stenotic  Right vertebral artery flow is antegrade  Left vertebral artery flow is antegrade  Individual velocities reviewed: Yes.  Results were reviewed with the patient.    CTA neck - 05/31/11 (reviewed per Dr. Allyne Gee)  Impression:   1. 70% stenosis of the left internal carotid artery at the level of   the carotid bulb and proximally due to calcified atherosclerosis.   There is approximately 50% stenosis on the right at the same level   also due to calcified atherosclerosis. Bilateral vertebral arteries   appear patent without significant stenosis. The visualizes   intracranial vasculature appears patent without significant stenosis.   2. Nodular enlarged thyroid gland likely a multinodular goiter.     Patient was seen by Dr. Allyne Gee      Options have been discussed with the patient including continued medical management vs left CE. We will have her see Dr. Chase Picket for cardiac clearance. Patient wishes to see Dr. Chase Picket and make her decision thereafter regarding proceeding with a left CE.    Risks of surgery have been reviewed with the patient including but not limited to MI, death, CVA, bleeding, nerve injury, infection, need for further surgery, pain, and extended recovery time.    Assessment    1. Carotid artery stenosis          Plan      She has an  appointment with  Dr. Chase Picket on 07/04/11 for cardiac clearance. After she has been seen by Cardiology I   will call her regarding her wishes to proceed with left CE vs continued medical management.  Start/Continue ASA EC 81 mg daily  Start/Continue antiplatelet med: Plavix - dose 75 mg q day  Strongly encourage start/continue statin therapy  Recommend no smoking  Patient instructed to call or proceed to the emergency room with any symptoms of lateralizing weakness, loss of vision in one eye, or episodes slurred speech.    *as of note she reports that on 2 separate occasions in the past after she has had a procedure requiring anesthesia, she had complications requiring admit to CCU *    Addendum:  Cardiac clearance obtained by Dr. Chase Picket    Proceed with a Left CE per Dr. Allyne Gee      Cc - Verlin Dike

## 2011-07-01 NOTE — Telephone Encounter (Signed)
error 

## 2011-07-04 NOTE — Patient Instructions (Signed)
 Register at the [facility] located on the first floor of [facility].       Date/Time:     Dobutamine Stress Test  A chemical stress test uses chemical agents injected into the body through the vein.  These chemicals make the heart function as if it were under stress.  A chemical stress test is used when a traditional stress test (called a cardiac stress test) cannot be done.  Testing will take approximately one hour.      Dobutamine Stress Echocardiogram Instructions:   No caffeine 24 hours prior to the testing.  This includes: coffee, pop/soda, chocolate, cold medications, etc.  Any product that might contain caffeine.    Do not eat or drink anything, except water, eight (8) hours before the test.   No alcohol or nicotine twelve (12) hours prior to your test.   Wear comfortable clothing.  If you are taking metoprolol, stop morning of this procedure.     If you need to change this appointment, please call outpatient scheduling at [phone].

## 2011-07-04 NOTE — Progress Notes (Signed)
History and Physical    Valerie Nicholson is a 78 y.o. female who is here for Cardiac Clearance  Patient is needing clearance for carotid artery surgery. The sugery is not scheduled yet. The patient states she is having no cardiac complaints and that her Bp at home and family PCP has been wnl.     Subjective  Valerie Nicholson denies exertional chest pain, shortness of breath,orthopnea, paroxysmal nocturnal dyspnea, syncope, presyncope and arrythmia.  The patient denies numbness or weakness to suggest cerebrovascular accident or transient ischemic attack.    Valerie Nicholson has the following history as recorded in EpicCare:    Patient Active Problem List    Diagnosis Date Noted   . Preop cardiovascular exam 07/04/2011   . Carotid artery stenosis 06/21/2011   . Aortic stenosis 01/24/2010   . HYPERTENSION    . DM w/o complication type II    . Family history of colon cancer 12/16/2009   . Abdominal pain, acute, generalized 12/16/2009     Past Medical History   Diagnosis Date   . Lipoma    . Colon polyps    . Hyperthyroidism    . HH (hiatus hernia)    . Hypertension    . Barrett's esophagus    . Allergic rhinitis    . Type II or unspecified type diabetes mellitus without mention of complication, not stated as uncontrolled    . Osteoarthritis    . TIA (transient ischemic attack)    . Asthma    . Obesity    . Spinal stenosis    . Aortic stenosis 01/24/2010   . Shortness of breath    . Chest pain    . Transient cerebral ischemia    . Chronic back pain    . DVT (deep venous thrombosis)    . Hyperlipidemia    . Hypothyroidism    . Neuropathy    . Barrett esophagus    . Carotid artery stenosis 06/21/2011     Past Surgical History   Procedure Laterality Date   . Carpal tunnel release     . Hemorrhoid surgery     . Knee arthroscopy       BILATERAL   . Cataract removal     . Colonoscopy  06/2009   . Upper gastrointestinal endoscopy  12/2008   . Appendectomy     . Nasal septum surgery       repair   . Cholecystectomy       Family History    Problem Relation Age of Onset   . Cancer Mother    . Colon Cancer Mother    . Cancer Brother    . Colon Cancer Brother    . Cancer Maternal Aunt    . Emphysema Father    . Heart Failure Father      History   Substance Use Topics   . Smoking status: Former Smoker -- 1.00 packs/day for 20 years   . Smokeless tobacco: Never Used    Comment: Stopped 1955   . Alcohol Use: No      Current Outpatient Prescriptions   Medication Sig Dispense Refill   . atenolol (TENORMIN) 50 MG tablet Take 50 mg by mouth 2 times daily.       Marland Kitchen atorvastatin (LIPITOR) 80 MG tablet Take 80 mg by mouth daily.       . clopidogrel (PLAVIX) 75 MG tablet Take 75 mg by mouth daily.       Marland Kitchen  glipiZIDE (GLUCOTROL) 5 MG tablet Take 5 mg by mouth daily.       . metformin (GLUCOPHAGE-XR) 500 MG XR tablet Take 500 mg by mouth daily.       . montelukast (SINGULAIR) 10 MG tablet Take 10 mg by mouth Daily.       . magnesium oxide (MAG-OX) 400 MG tablet Take 400 mg by mouth 2 times daily.         Marland Kitchen olmesartan (BENICAR) 40 MG tablet Take 1 tablet by mouth daily.  30 tablet  5   . budesonide-formoterol (SYMBICORT) 160-4.5 MCG/ACT AERO Inhale 2 puffs into the lungs 2 times daily.         Marland Kitchen esomeprazole Magnesium (NEXIUM) 40 MG PACK 40 mg daily.         . Loratadine (CLARITIN PO) Take  by mouth.         . ASPIRIN PO Take 81 mg by mouth daily.       Marland Kitchen CALCIUM PO Take  by mouth.         . Pirbuterol Acetate (MAXAIR AUTOHALER IN) Inhale  into the lungs as needed.       . Olopatadine HCl (PATANASE NA) by Nasal route.           No current facility-administered medications for this visit.       Allergies: Darvocet; Meloxicam; Morphine; Pcn; Septra; Ultracet; Zanaflex; and Fentanyl      Review of Systems   Constitutional: Negative for fever, chills, diaphoresis, activity change, appetite change, fatigue and unexpected weight change.   HENT: Negative for nosebleeds, facial swelling, rhinorrhea and neck stiffness.    Eyes: Negative for photophobia, pain, redness and  visual disturbance.   Respiratory: Negative for apnea, cough, chest tightness, shortness of breath, wheezing and stridor.    Cardiovascular: Negative for chest pain, palpitations and leg swelling.   Gastrointestinal: Negative for abdominal distention.   Genitourinary: Negative for dysuria, urgency and frequency.   Musculoskeletal: Negative for myalgias, arthralgias and gait problem.   Skin: Negative for color change, pallor, rash and wound.   Neurological: Negative for dizziness, tremors, speech difficulty, weakness and numbness.   Hematological: Does not bruise/bleed easily.   Psychiatric/Behavioral: Negative.      Physical Exam   BP 156/72  Pulse 64  Ht 5' (1.524 m)  Wt 216 lb (97.977 kg)  BMI 42.18 kg/m2  Constitutional: She is oriented to person, place, and time. She appears well-developed and well-nourished. No distress.   HENT:   Head: Normocephalic and atraumatic.   Eyes: EOM are normal. Pupils are equal, round, and reactive to light.   Neck: Normal range of motion. Neck supple. No JVD present.   Cardiovascular: Normal rate, regular rhythm, normal heart sounds and intact distal pulses.  Exam reveals no gallop and no friction rub.    No murmur heard.  Pulmonary/Chest: Effort normal and breath sounds normal. No respiratory distress. She has no wheezes. She has no rales. She exhibits no tenderness.   Abdominal: Bowel sounds are normal.   Musculoskeletal: Normal range of motion. She exhibits no edema.   Neurological: She is alert and oriented to person, place, and time.   Skin: Skin is warm and dry. No rash noted. She is not diaphoretic. No erythema. No pallor.   Psychiatric: She has a normal mood and affect. Her behavior is normal. Thought content normal.     ASSESSMENT    1. Hypertension    2. Aortic stenosis    3.  Carotid artery stenosis    4. Preop cardiovascular exam        PLAN    There are no Patient Instructions on file for this visit.     1. Return for office visit:  2. Continue same  medications.  3. Call with any problems, questions or concerns.  4. HTN-CPT  5. preop cardiovascular- stress test pharm with echo  6. Aortic stenosis-continue to follow  7. Cardiac education given  8. Fu as scheduled

## 2011-07-26 ENCOUNTER — Encounter

## 2011-07-26 NOTE — Telephone Encounter (Signed)
Spoke with patient, details/instructions and medications were addressed for surgery at [facility] on 08/20/11 with Dr. Allyne Gee. She will need to register at the [facility] outpatient registration at 6:00am. She will be admitted after surgery. She will need to register at the [facility] outpatient registration on 08/09/11 for pre-op test (Lab, chest x-ray, and EKG). She may register anytime between 9:00am and 3:30pm on 08/09/11. Patient verbalizes understanding of details and instructions. Written instructions were mailed to the patient.

## 2011-07-27 NOTE — Telephone Encounter (Signed)
Notified patient regarding results of 2D Echo and Dobutamine Stress Echo.  Informed clear for carotid surgery from cardiac standpoint.  Staff msg sent to Grace Bushy, RN regarding clearance.

## 2011-08-08 NOTE — Telephone Encounter (Signed)
The patient called to cancel her procedure scheduled for 08/24/11. The pt was to have a left carotid endarterectomy. The pt states that she is on her way to the ER with her husband and she doesn't know how that situation will "pan out" so she wishes to call back at a later date to r/s. Information forwarded to Lodema Hong, RN and [employer].

## 2011-09-13 NOTE — Progress Notes (Signed)
[facility] [place], [place].  [facility]  [address], [address], [place]  [address]  Phone: [phone]  Fax: [phone]  Office Visit:  09/12/2011    Valerie Nicholson  Female, 78 y.o., DOB: [birth year]     Subjective:   Valerie Nicholson presents for routine follow-up.  She has obviously been under increased stress because of Mr. Marchitto health.  Fortunately, there is no angina, no overt heart failure, no syncope, and no stroke-like problems.      Valerie Nicholson has the following history as recorded in EpicCare:  Patient Active Problem List    Diagnosis Date Noted   . Preop cardiovascular exam 07/04/2011   . Carotid artery stenosis 06/21/2011   . Aortic stenosis 01/24/2010   . HYPERTENSION    . DM w/o complication type II    . Family history of colon cancer 12/16/2009   . Abdominal pain, acute, generalized 12/16/2009     Past Medical History   Diagnosis Date   . Lipoma    . Colon polyps    . Hyperthyroidism    . HH (hiatus hernia)    . Hypertension    . Barrett's esophagus    . Allergic rhinitis    . Type II or unspecified type diabetes mellitus without mention of complication, not stated as uncontrolled    . Osteoarthritis    . TIA (transient ischemic attack)    . Asthma    . Obesity    . Spinal stenosis    . Aortic stenosis 01/24/2010   . Shortness of breath    . Chest pain    . Transient cerebral ischemia    . Chronic back pain    . DVT (deep venous thrombosis)    . Hyperlipidemia    . Hypothyroidism    . Neuropathy    . Barrett esophagus    . Carotid artery stenosis 06/21/2011     Past Surgical History   Procedure Laterality Date   . Carpal tunnel release     . Hemorrhoid surgery     . Knee arthroscopy       BILATERAL   . Cataract removal     . Colonoscopy  06/2009   . Upper gastrointestinal endoscopy  12/2008   . Appendectomy     . Nasal septum surgery       repair   . Cholecystectomy       Family History   Problem Relation Age of Onset   . Cancer Mother    . Colon Cancer  Mother    . Cancer Brother    . Colon Cancer Brother    . Cancer Maternal Aunt    . Emphysema Father    . Heart Failure Father      History   Substance Use Topics   . Smoking status: Former Smoker -- 1.00 packs/day for 20 years   . Smokeless tobacco: Never Used    Comment: Stopped 1955   . Alcohol Use: No     Allergies: Darvocet; Meloxicam; Morphine; Pcn; Septra; Ultracet; Zanaflex; and Fentanyl    Outpatient Prescriptions Marked as Taking for the 09/12/11 encounter (Office Visit) with [name], MD   Medication Sig Dispense Refill   . atenolol (TENORMIN) 50 MG tablet Take 50 mg by mouth 2 times daily.       Marland Kitchen atorvastatin (LIPITOR) 80 MG tablet Take 80 mg by mouth daily.       Marland Kitchen  clopidogrel (PLAVIX) 75 MG tablet Take 75 mg by mouth daily.       Marland Kitchen glipiZIDE (GLUCOTROL) 5 MG tablet Take 5 mg by mouth daily.       . metformin (GLUCOPHAGE-XR) 500 MG XR tablet Take 500 mg by mouth daily.       . montelukast (SINGULAIR) 10 MG tablet Take 10 mg by mouth Daily.       Marland Kitchen olmesartan (BENICAR) 40 MG tablet Take 1 tablet by mouth daily.  30 tablet  5   . budesonide-formoterol (SYMBICORT) 160-4.5 MCG/ACT AERO Inhale 2 puffs into the lungs 2 times daily.         Marland Kitchen esomeprazole Magnesium (NEXIUM) 40 MG PACK 40 mg daily.         . Loratadine (CLARITIN PO) Take  by mouth.         . ASPIRIN PO Take 81 mg by mouth daily.       Marland Kitchen CALCIUM PO Take  by mouth.         . Pirbuterol Acetate (MAXAIR AUTOHALER IN) Inhale  into the lungs as needed.       . Olopatadine HCl (PATANASE NA) by Nasal route.           Objective:   BP 160/94  Pulse 88  Ht 5\' 2"  (1.575 m)  Wt 210 lb (95.255 kg)  BMI 38.4 kg/m2    GENERAL:  The patient is a pleasant female in no apparent distress.   HEENT:  The head is normocephalic.  NECK:  Supple without increased jugular venous pressures.       LUNGS:  The lungs are clear.      HEART:  The heart's rhythm is regular without appreciable murmur or rub.   ABDOMEN:  Soft, nontender, nondistended.  Bowel sounds  are intact.   EXTREMITIES:  There is no clubbing, cyanosis, or edema.   NEUROLOGICAL:  Cranial nerves II through XII are grossly intact.      Assessment:   1.  Stable cardiovascular status.     Plan:   1.  Continue same medications.   2.  Return for wellness visit in six months and see me in a year.  3.  Call with any problems, questions or concerns.    Judith Blonder. Chase Picket, M.D., Ph.D., F.A.C.C.  [facility] [place], [place].     Copy:   Verlin Dike  [address]  [place] [address]  [phone] office  [phone] fax

## 2011-11-20 NOTE — Telephone Encounter (Signed)
Patient called stating that she had to cancel her carotid surgery back on 08/20/11 due to her husband's illness. She is still having issues with her husbands illness and doesn't want to reschedule the surgery at this time. She states that she started having pain in her neck and can feel her heart beating in her neck that keeps her up at night. She denies any AF, TIA, or lateralizing weakness. Boyd Kerbs Legereit, PA-C informed, patient still needs surgery, we will need to see her back in the office before we reschedule the surgery. Neck pain and feeling heartbeat in neck are not associated with symptoms of carotid disease. Patient informed that she still needs the carotid surgery and that we need to see her in the office before we reschedule her surgery. Informed her that neck pain and feeling heartbeat in neck is generally not associated with carotid disease but that she should call or go to the ER with any signs of visual disturbance particularly in one eye, any problems with speech, or any weakness/numbness particularly one side of the body. Patient verbalizes understanding. She states that she needs to check on a few things and will call later to schedule an appointment.

## 2012-01-07 NOTE — Progress Notes (Addendum)
Subjective:      Patient ID: Valerie Nicholson is a 78 y.o. female.    HPI  Patient is seen for EGD recall. Patient has a history of Barrett's Esophagus.  Last EGD 12/31/08 revealed negative urea, gastritis and continued known Barrett's disease, short segment, with 3 year recall.  She complains today of difficulty swallowing and constipation.  She reports dysphagia with pills, and breads, is eating slowly and chews repeatedly which helps some.  She is currently taking Pantoprazole daily with good control of reflux.   She controls chronic constipation with the use of Miralax and almonds.   She has no complaints of reflux or nausea.   Last c-scope 07/12/09 revealed sigmoid diverticulosis, she has a history of carpeting polyp mid ascending colon without recurrence, with 3 year recall.  She denies abdominal pain, blood in stool or change in bowel habits.  She has a 70% left carotid artery stenosis, has had to cancel surgery due to family issues.  Patient stable for procedure.    Family history: mother, brothers x 2, grandfather colon cancer    Past Medical History   Diagnosis Date   . Lipoma    . Colon polyps    . Hyperthyroidism    . HH (hiatus hernia)    . Hypertension    . Barrett's esophagus    . Allergic rhinitis    . Type II or unspecified type diabetes mellitus without mention of complication, not stated as uncontrolled    . Osteoarthritis    . TIA (transient ischemic attack)    . Asthma    . Obesity    . Spinal stenosis    . Aortic stenosis 01/24/2010   . Shortness of breath    . Chest pain    . Transient cerebral ischemia    . Chronic back pain    . DVT (deep venous thrombosis)    . Hyperlipidemia    . Hypothyroidism    . Neuropathy    . Carotid artery stenosis 06/21/2011   . Arthritis    . Neck pain    . Thyroid disorder      Past Surgical History   Procedure Laterality Date   . Carpal tunnel release     . Hemorrhoid surgery     . Knee arthroscopy       BILATERAL   . Cataract removal     . Colonoscopy  07-12-09     Dr  Beckey Downing   . Upper gastrointestinal endoscopy  12-31-08     Dr Beckey Downing   . Appendectomy     . Nasal septum surgery       repair   . Cholecystectomy     . Colonoscopy  11-06-05     Dr Beckey Downing   . Colonoscopy  12-17-03     Dr Beckey Downing   . Upper gastrointestinal endoscopy  12-21-03     Dr Beckey Downing   . Upper gastrointestinal endoscopy  10-30-05     Dr Beckey Downing     History     Social History   . Marital Status: Married     Spouse Name: N/A     Number of Children: N/A   . Years of Education: N/A     Social History Main Topics   . Smoking status: Former Smoker -- 1.00 packs/day for 20 years   . Smokeless tobacco: Never Used    Comment: Stopped 1955   . Alcohol Use: No   . Drug Use: No   .  Sexually Active: None     Other Topics Concern   . None     Social History Narrative   . None     Current Outpatient Prescriptions on File Prior to Visit   Medication Sig Dispense Refill   . pantoprazole (PROTONIX) 40 MG tablet Take 40 mg by mouth 2 times daily.       Marland Kitchen therapeutic multivitamin-minerals (THERAGRAN-M) tablet Take 1 tablet by mouth daily.       Marland Kitchen atenolol (TENORMIN) 50 MG tablet Take 50 mg by mouth 2 times daily.       Marland Kitchen atorvastatin (LIPITOR) 80 MG tablet Take 80 mg by mouth daily.       . clopidogrel (PLAVIX) 75 MG tablet Take 75 mg by mouth daily.       Marland Kitchen glipiZIDE (GLUCOTROL) 5 MG tablet Take 5 mg by mouth daily.       . metformin (GLUCOPHAGE-XR) 500 MG XR tablet Take 500 mg by mouth daily.       . montelukast (SINGULAIR) 10 MG tablet Take 10 mg by mouth Daily.       Marland Kitchen olmesartan (BENICAR) 40 MG tablet Take 1 tablet by mouth daily.  30 tablet  5   . budesonide-formoterol (SYMBICORT) 160-4.5 MCG/ACT AERO Inhale 2 puffs into the lungs 2 times daily.         . Loratadine (CLARITIN PO) Take 5 mg by mouth 2 times daily.       . ASPIRIN PO Take 81 mg by mouth daily.       Marland Kitchen CALCIUM PO Take 600 mg by mouth 2 times daily.       . Pirbuterol Acetate (MAXAIR AUTOHALER IN) Inhale  into the lungs as needed.       .  Olopatadine HCl (PATANASE NA) by Nasal route as needed.         No current facility-administered medications on file prior to visit.       Review of Systems   Constitutional: Negative for fever, chills, activity change, appetite change, fatigue and unexpected weight change.   HENT: Positive for trouble swallowing and voice change (cant sing anymore). Negative for hearing loss, sore throat, rhinorrhea, dental problem and sinus pressure.    Eyes: Negative for visual disturbance.   Respiratory: Negative for cough, chest tightness, shortness of breath and wheezing.    Cardiovascular: Negative for chest pain, palpitations and leg swelling.   Gastrointestinal: Positive for constipation. Negative for nausea, vomiting, abdominal pain, diarrhea, blood in stool, abdominal distention and anal bleeding.   Genitourinary: Negative for dysuria, urgency, frequency, hematuria and difficulty urinating.   Musculoskeletal: Positive for back pain, joint swelling and arthralgias. Negative for myalgias.   Skin: Negative for color change and rash.   Neurological: Negative for dizziness, tremors, seizures, weakness, numbness and headaches.   Hematological: Does not bruise/bleed easily.   Psychiatric/Behavioral: Negative for confusion and dysphoric mood. The patient is not nervous/anxious.        Objective:   Physical Exam   Constitutional: She is oriented to person, place, and time. Vital signs are normal. She appears well-developed and well-nourished. She is cooperative. No distress.   obese   HENT:   Head: Normocephalic and atraumatic.   Eyes: Conjunctivae and EOM are normal. No scleral icterus.   Neck: Trachea normal and normal range of motion. Neck supple. No JVD present.   Cardiovascular: Normal rate, regular rhythm, normal heart sounds and normal pulses.  Exam reveals no gallop.  No murmur heard.  Pulmonary/Chest: Effort normal and breath sounds normal. No respiratory distress. She has no wheezes. She has no rales.   Abdominal:  Soft. Bowel sounds are normal. She exhibits no distension and no mass. There is no splenomegaly or hepatomegaly. There is no tenderness. There is no rebound and no guarding.   Musculoskeletal: She exhibits no edema.   Uses walker to ambulate   Neurological: She is alert and oriented to person, place, and time.   Skin: Skin is warm, dry and intact. No lesion and no rash noted. She is not diaphoretic.   Psychiatric: She has a normal mood and affect. Her speech is normal and behavior is normal. Her mood appears not anxious.       Assessment:      1. Barrett esophagus  ESOPHAGOSCOPY / EGD   2. Dysphagia     3. GERD (gastroesophageal reflux disease)  ESOPHAGOSCOPY / EGD   4. History of colon polyps     5. High risk for colon cancer     6. Family history of colon cancer            Plan:      Schedule outpatient endoscopy with MAC anesthesia. With dilation  Patient advised no Aspirin, Fish Oil, Vit E or NSAIDs 5 (five) days before procedure.  Follow-up Visit: per Dr. Beckey Downing  Education: Risks and Benefits      gain approval from Dr Allyne Gee for procedure due to 70% left carotid artery, she has not been able to have surgery as planned.    ADDENDUM: 01/17/16 received denial for discontinuation of Plavix for 5 days for EGD.  Will place pt in recall for 6 months.

## 2012-01-07 NOTE — Patient Instructions (Signed)
 Schedule outpatient endoscopy with MAC anesthesia.  Patient advised no Aspirin, Fish Oil, Vit E or NSAIDs 5 (five) days before procedure.  Follow-up Visit: per Dr. Beckey Downing  Education: Risks and Benefits

## 2012-01-15 NOTE — Progress Notes (Signed)
Patient advised that per Terri, patient will get a reminder in 6 months to see if she can go off plavix to have endo. Patient understood. cem

## 2012-02-20 ENCOUNTER — Encounter

## 2012-02-20 LAB — CBC PANEL ITEM
Basophils %: 0.3 % (ref 0–1)
Basophils: 0.02 10*3/uL (ref 0.0–0.20)
Eosinophils %: 0.16 10*3/uL (ref 0.0–0.60)
Eosinophils %: 2.6 % (ref 1–5)
Hematocrit: 35.3 % — ABNORMAL LOW (ref 37–47)
Hemoglobin: 12.3 G/DL (ref 12.0–16.0)
Lymphocytes: 1.8 10*3/uL (ref 1.1–4.5)
Lymphocytes: 29.5 % (ref 20–40)
MCH: 29.8 PG (ref 27–31)
MCHC: 34.8 G/DL (ref 33–37)
MCV: 85.5 FL (ref 81–99)
MPV: 10.7 FL (ref 10.2–13.2)
Monocytes %: 8.7 % (ref 0–10)
Monocytes: 0.53 10*3/uL (ref 0.0–0.9)
Neutrophils %: 58.9 % (ref 50–70)
Neutrophils Absolute: 3.6 10*3/uL (ref 1.5–7.5)
Platelets: 228 10*3/uL (ref 130–400)
RBC: 4.13 MIL/uL — ABNORMAL LOW (ref 4.2–5.4)
RDW: 13 % (ref 11.0–14.0)
WBC: 6.11 10*3/uL (ref 4.8–10.8)

## 2012-02-20 LAB — BASIC METABOLIC PANEL
Anion Gap: 10 MEQ/L (ref 7–19)
BUN: 17 MG/DL (ref 5–18)
CO2: 29 MEQ/L (ref 23–29)
Calcium: 9.5 MG/DL (ref 8.8–10.2)
Chloride: 98 mmol/L (ref 98–111)
Creatinine: 0.8 MG/DL (ref 0.5–0.9)
Gfr Calculated: 74 mL/min (ref 59–300)
Glucose: 86 MG/DL
Potassium: 4.6 mmol/L (ref 3.5–5.0)
Sodium: 137 mmol/L (ref 136–145)

## 2012-02-20 LAB — PROTIME-INR
INR: 1.05 (ref 0.92–1.17)
Protime: 13.2 s (ref 11.9–14.4)

## 2012-02-20 LAB — APTT: PTT: 27.5 s (ref 24.5–35.2)

## 2012-02-20 NOTE — Telephone Encounter (Signed)
Spoke with Joyce Gross at Dr. Wende Bushy office, informed her that patient is scheduled for Left carotid endarterectomy on 03/19/12 with Dr. Allyne Gee and we need cardiac clearance prior to surgery. We have sent the patient today for pre-op lab, chest x-ray and EKG. She has an appointment with Chip Boer, NP on 03/14/12. Joyce Gross states that they will probably move up her appointment. They will see her in the office and let us know about clearance.

## 2012-02-20 NOTE — Progress Notes (Signed)
History and Physical    Valerie Nicholson has a history of carotid artery stenosis. She reports Her current treatment includes ASA 81 mg po qd, clopidogrel 75 mg po qd.She denies a history of CVA. She reports no TIA's, episodes of amaurosis fugax and episodes of lateralizing weakness. She was scheduled for a left CE on 08/20/11 and had to cancel due to her husbands medical problems. She comes in today and wishes to proceed with her surgery. She was seen by cardiology and cleared for her scheduled left CE in April.     Valerie Nicholson is a 78 y.o. female with the following history reviewed and recorded in EpicCare:  Patient Active Problem List    Diagnosis Date Noted   . Barrett esophagus 01/07/2012   . GERD (gastroesophageal reflux disease) 01/07/2012   . History of colon polyps 01/07/2012   . High risk for colon cancer 01/07/2012   . Dysphagia 01/07/2012   . Preop cardiovascular exam 07/04/2011   . Carotid artery stenosis 06/21/2011   . Aortic stenosis 01/24/2010   . HYPERTENSION    . DM w/o complication type II    . Family history of colon cancer 12/16/2009     PT HAS STRONG FAMILY HISTORY OF CLN CA (MOM, GRANDFATHER, 2 BROTHERS, COUSINS SON)     . Abdominal pain, acute, generalized 12/16/2009     Current Outpatient Prescriptions   Medication Sig Dispense Refill   . phenylephrine (NEO-SYNEPHRINE) 1 % nasal spray 1 drop by Nasal route every 4 hours as needed.       . pantoprazole (PROTONIX) 40 MG tablet Take 40 mg by mouth 2 times daily.       Marland Kitchen therapeutic multivitamin-minerals (THERAGRAN-M) tablet Take 1 tablet by mouth daily.       Marland Kitchen atenolol (TENORMIN) 50 MG tablet Take 50 mg by mouth 2 times daily.       Marland Kitchen atorvastatin (LIPITOR) 80 MG tablet Take 80 mg by mouth daily.       . clopidogrel (PLAVIX) 75 MG tablet Take 75 mg by mouth daily.       Marland Kitchen glipiZIDE (GLUCOTROL) 5 MG tablet Take 5 mg by mouth daily.       . metformin (GLUCOPHAGE-XR) 500 MG XR tablet Take 500 mg by mouth daily.       . montelukast (SINGULAIR) 10 MG  tablet Take 10 mg by mouth Daily.       Marland Kitchen olmesartan (BENICAR) 40 MG tablet Take 1 tablet by mouth daily.  30 tablet  5   . budesonide-formoterol (SYMBICORT) 160-4.5 MCG/ACT AERO Inhale 2 puffs into the lungs 2 times daily.         . Loratadine (CLARITIN PO) Take 5 mg by mouth 2 times daily.       . ASPIRIN PO Take 81 mg by mouth daily.       Marland Kitchen CALCIUM PO Take 600 mg by mouth 2 times daily.         No current facility-administered medications for this visit.     Allergies: Pcn; Darvocet; Fentanyl; Meloxicam; Morphine; Septra; Ultracet; and Zanaflex  Past Medical History   Diagnosis Date   . Lipoma    . Colon polyps    . Hyperthyroidism    . HH (hiatus hernia)    . Hypertension    . Barrett's esophagus    . Allergic rhinitis    . Type II or unspecified type diabetes mellitus without mention of complication, not stated as uncontrolled    .  Osteoarthritis    . TIA (transient ischemic attack)    . Asthma    . Obesity    . Spinal stenosis    . Aortic stenosis 01/24/2010   . Shortness of breath    . Chest pain    . Transient cerebral ischemia    . Chronic back pain    . DVT (deep venous thrombosis)    . Hyperlipidemia    . Hypothyroidism    . Neuropathy    . Carotid artery stenosis 06/21/2011   . Arthritis    . Neck pain    . Thyroid disorder    . GERD (gastroesophageal reflux disease) 01/07/2012   . High blood pressure      Past Surgical History   Procedure Laterality Date   . Carpal tunnel release     . Hemorrhoid surgery     . Knee arthroscopy       BILATERAL   . Cataract removal     . Colonoscopy  07-12-09     Dr Beckey Downing   . Upper gastrointestinal endoscopy  12-31-08     Dr Beckey Downing   . Appendectomy     . Nasal septum surgery       repair   . Cholecystectomy     . Colonoscopy  11-06-05     Dr Beckey Downing   . Colonoscopy  12-17-03     Dr Beckey Downing   . Upper gastrointestinal endoscopy  12-21-03     Dr Beckey Downing   . Upper gastrointestinal endoscopy  10-30-05     Dr Beckey Downing     Family History   Problem Relation Age of Onset    . Colon Cancer Mother    . Colon Cancer Brother    . Colon Cancer Brother    . Cancer Maternal Aunt    . Emphysema Father    . Heart Failure Father    . Colon Cancer Maternal Grandfather      History   Substance Use Topics   . Smoking status: Former Smoker -- 1.00 packs/day for 20 years   . Smokeless tobacco: Never Used    Comment: Stopped 1955   . Alcohol Use: No         Old records have been obtained from the referring provider.  These records have been reviewed and summarized.      Review of Systems    Constitutional - no significant activity change, appetite change, or unexpected weight change.  No fever or chills.  No diaphoresis or significant fatigue.  HENT - no significant rhinorrhea or epistaxis.  No tinnitus or significant hearing loss.  Eyes - no sudden vision change or amaurosis.  Respiratory - no significant shortness of breath, wheezing, or stridor.  No apnea, cough, or chest tightness associated with shortness of breath.  Cardiovascular - no chest pain, syncope, or significant dizziness.  No palpitations or significant leg swelling.  No claudication.  Gastrointestinal - no abdominal swelling or pain.  No blood in stool.  No severe constipation, diarrhea, nausea, or vomiting.  Genitourinary - No difficulty urinating, dysuria, frequency, or urgency.  No flank pain or hematuria.  Musculoskeletal - no back pain, gait disturbance, or myalgia.  Skin - no color change, rash, pallor, or new wound.  Neurologic - no dizziness, facial asymmetry, or light headedness.  No seizures.  No speech difficulty or lateralizing weakness.  Hematologic - no easy bruising or excessive bleeding.  Psychiatric - no severe anxiety or nervousness.  No confusion.  All other review of systems are negative.    Physical Exam    BP 170/80  Pulse 60  Temp(Src) 97.4 F (36.3 C)  Resp 18    Constitutional - well developed, well nourished.  No diaphoresis or acute distress.  HENT - head normocephalic.  Right external ear canal  appears normal.  Left external ear canal appears normal.  Septum appears midline.  Eyes - conjunctiva normal.  EOM'S normal.  No exudate.  No icterus.  Neck- ROM appears normal, no tracheal deviation.  Cardiovascular - Regular rate and rhythm.  Heart sounds are normal.  No murmur, rub, or gallop.  Carotid pulses are 2+ to palpation bilaterally without bruit.    Extremities - Radial and brachial pulses are 2+ to palpation bilaterally.  No cyanosis, clubbing, or significant edema.  No signs atheroembolic event.  Pulmonary - effort appears normal.  No respiratory distress.    Lungs - Breath sounds normal. No wheezes or rales.    GI - Abdomen - soft, non tender, bowel sounds X 4 quadrants.  No guarding or rebound tenderness.  No distension or palpable mass.  Genitourinary - deferred.  Musculoskeletal - ROM appears normal.  No significant edema.  Neurologic - alert and oriented X 3.  Physiologic.  Tongue midline.  Face symmetric.  Skin - warm, dry, and intact.  No rash, erythema, or pallor.  Psychiatric - mood, affect, and behavior appear normal.  Judgment and thought processes appear normal.    Risk factors for atherosclerosis of all vascular beds have been reviewed with the patient including:  Family history, tobacco abuse in all forms, elevated cholesterol, hyperlipidemia, and diabetes.      Doppler results: 04/09/11    Right CCA/ICA <50% stenotic  Left CCA/ICA 50-69% stenotic  Right vertebral artery flow is antegrade  Left vertebral artery flow is antegrade  Individual velocities reviewed: Yes.  Results were reviewed with the patient.    CTA neck - 05/31/11 (reviewed per Dr. Allyne Gee)  Impression:   1. 70% stenosis of the left internal carotid artery at the level of   the carotid bulb and proximally due to calcified atherosclerosis.   There is approximately 50% stenosis on the right at the same level   also due to calcified atherosclerosis. Bilateral vertebral arteries   appear patent without significant stenosis. The  visualizes   intracranial vasculature appears patent without significant stenosis.   2. Nodular enlarged thyroid gland likely a multinodular goiter.       Options have been discussed with the patient including continued medical management vs left CE. She wishes to proceed with a left CE    Risks of surgery have been reviewed with the patient including but not limited to MI, death, CVA, bleeding, nerve injury, infection, need for further surgery, pain, and extended recovery time.    Assessment    1. Carotid artery stenosis          Plan    We will send her for a CDU today if the left side is still open we will schedule her for a left CE   Start/Continue ASA EC 81 mg daily  Start/Continue antiplatelet med: Plavix - dose 75 mg q day  Strongly encourage start/continue statin therapy  Recommend no smoking  Patient instructed to call or proceed to the emergency room with any symptoms of lateralizing weakness, loss of vision in one eye, or episodes slurred speech.    *as of note she reports that on 2 separate occasions in  the past after she has had a procedure requiring anesthesia, she had complications requiring admit to CCU *    Addendum:  Doppler results: 02/20/12    Right CCA/ICA  <50% stenotic  Left CCA/ICA 50-69% stenotic  Right verterbral artery flow is antegrade  Left verterbral artery flow is antegrade  Individual velocities reviewed: Yes.  Results were reviewed with the patient.      Cardiac clearance obtained by Dr. Chase Picket    Proceed with a Left CE per Dr. Allyne Gee      Cc - Verlin Dike

## 2012-02-26 NOTE — Progress Notes (Signed)
VASCULAR SURGERY PRE-OP ORDERS  Valerie Nicholson 02-Aug-1930  DATE OF PROCEDURE 03/19/12  HOSPITAL - [facility]      Allergies: Pcn; Darvocet; Fentanyl; Meloxicam; Morphine; Septra; Ultracet; and Zanaflex        1)  Admit to inpatient  - Operative Care Dr. Allyne Gee   2)  Diagnosis   1. Carotid artery stenosis       3)  Permit for left carotid endarterectomy  4)  NPO except for meds with sip of water  5)  Vancomycin 1 gm IV in holding  6)  CBC, CMP, PT, PTT, CXR and EKG done on 02/20/12  7)  Baseline vital signs with B/P in both arms  8)  Aspirin 325 mg po EC     ________________________________________________________________________  Signature     Date     Time

## 2012-03-07 NOTE — Progress Notes (Signed)
[facility], [place]  [address], [place]  [address]  Phone: [phone]  Fax: [phone]    OFFICE VISIT:  03/07/2012    Valerie Nicholson - DOB: 1930/09/10    Reason For Visit:  Valerie Nicholson is a 78 y.o. female who is here for 6 Month Follow-Up    Overall, the patient is doing well without cardiac related symptoms.  She presents today needing preoperative clearance for left carotid endarterectomy on 03/19/12.      Subjective  Valerie Nicholson denies exertional chest pain, orthopnea, paroxysmal nocturnal dyspnea, syncope, presyncope, sustained arrythmia, edema and fatigue.  The patient denies numbness or weakness to suggest cerebrovascular accident or transient ischemic attack.    Positive for DOE.    Valerie Nicholson has the following history as recorded in EpicCare:    Patient Active Problem List    Diagnosis Date Noted   . Barrett esophagus 01/07/2012   . GERD (gastroesophageal reflux disease) 01/07/2012   . History of colon polyps 01/07/2012   . High risk for colon cancer 01/07/2012   . Dysphagia 01/07/2012   . Preop cardiovascular exam 07/04/2011   . Carotid artery stenosis 06/21/2011   . Aortic stenosis 01/24/2010   . HYPERTENSION    . DM w/o complication type II    . Family history of colon cancer 12/16/2009   . Abdominal pain, acute, generalized 12/16/2009     Past Medical History   Diagnosis Date   . Lipoma    . Colon polyps    . Hyperthyroidism    . HH (hiatus hernia)    . Hypertension    . Barrett's esophagus    . Allergic rhinitis    . Type II or unspecified type diabetes mellitus without mention of complication, not stated as uncontrolled    . Osteoarthritis    . TIA (transient ischemic attack)    . Asthma    . Obesity    . Spinal stenosis    . Aortic stenosis 01/24/2010   . Shortness of breath    . Chest pain    . Transient cerebral ischemia    . Chronic back pain    . DVT (deep venous thrombosis)    . Hyperlipidemia    . Hypothyroidism    . Neuropathy    . Carotid artery stenosis  06/21/2011   . Arthritis    . Neck pain    . Thyroid disorder    . GERD (gastroesophageal reflux disease) 01/07/2012   . High blood pressure      Past Surgical History   Procedure Laterality Date   . Carpal tunnel release     . Hemorrhoid surgery     . Knee arthroscopy       BILATERAL   . Cataract removal     . Colonoscopy  07-12-09     Dr Beckey Downing   . Upper gastrointestinal endoscopy  12-31-08     Dr Beckey Downing   . Appendectomy     . Nasal septum surgery       repair   . Cholecystectomy     . Colonoscopy  11-06-05     Dr Beckey Downing   . Colonoscopy  12-17-03     Dr Beckey Downing   . Upper gastrointestinal endoscopy  12-21-03     Dr Beckey Downing   . Upper gastrointestinal endoscopy  10-30-05     Dr Beckey Downing     Family History   Problem Relation Age of  Onset   . Colon Cancer Mother    . Colon Cancer Brother    . Colon Cancer Brother    . Cancer Maternal Aunt    . Emphysema Father    . Heart Failure Father    . Colon Cancer Maternal Grandfather      History   Substance Use Topics   . Smoking status: Former Smoker -- 1.00 packs/day for 20 years   . Smokeless tobacco: Never Used    Comment: Stopped 1955   . Alcohol Use: No      Current Outpatient Prescriptions   Medication Sig Dispense Refill   . phenylephrine (NEO-SYNEPHRINE) 1 % nasal spray 1 drop by Nasal route every 4 hours as needed.       . pantoprazole (PROTONIX) 40 MG tablet Take 40 mg by mouth 2 times daily.       Marland Kitchen therapeutic multivitamin-minerals (THERAGRAN-M) tablet Take 1 tablet by mouth daily.       Marland Kitchen atenolol (TENORMIN) 50 MG tablet Take 50 mg by mouth 2 times daily.       Marland Kitchen atorvastatin (LIPITOR) 80 MG tablet Take 80 mg by mouth daily.       . clopidogrel (PLAVIX) 75 MG tablet Take 75 mg by mouth daily.       Marland Kitchen glipiZIDE (GLUCOTROL) 5 MG tablet Take 5 mg by mouth daily.       . metformin (GLUCOPHAGE-XR) 500 MG XR tablet Take 500 mg by mouth daily.       . montelukast (SINGULAIR) 10 MG tablet Take 10 mg by mouth Daily.       Marland Kitchen olmesartan (BENICAR) 40 MG tablet Take 1  tablet by mouth daily.  30 tablet  5   . budesonide-formoterol (SYMBICORT) 160-4.5 MCG/ACT AERO Inhale 2 puffs into the lungs 2 times daily.         . Loratadine (CLARITIN PO) Take 5 mg by mouth 2 times daily.       . ASPIRIN PO Take 81 mg by mouth daily.       Marland Kitchen CALCIUM PO Take 600 mg by mouth 2 times daily.         No current facility-administered medications for this visit.     Allergies: Pcn; Darvocet; Fentanyl; Meloxicam; Morphine; Septra; Ultracet; and Zanaflex    Review of Systems  Constitutional - no significant activity change, appetite change, or unexpected weight change. No fever, chills or diaphoresis.  No fatigue.   HEENT - no significant rhinorrhea or epistaxis. No tinnitus or significant hearing loss.   Eyes - no sudden vision change or amaurosis. No corneal arcus, xantholasma, subconjunctival hemorrhage or discharge.  Respiratory - no significant wheezing, stridor, apnea or cough.  Positive for DOE.  Cardiovascular - no exertional chest pain, orthopnea or PND.  No sensation of sustained arrythmia or slow heart rate.   No claudication or leg edema.  Gastrointestinal - no abdominal swelling or pain. No blood in stool. No severe constipation, diarrhea, nausea, or vomiting.   Genitourinary - no dysuria, frequency, or urgency. No flank pain or hematuria.   Musculoskeletal - no back pain or myalgia.  Ambulates with walker.  Extremities - no clubbing, cyanosis or edema.  Skin - no color change or rash.  No pallor.  No new surgical incision.  Neurologic - no speech difficulty, facial asymmetry or lateralizing weakness.  No seizures, presyncope, syncope, or significant dizziness.  Hematologic - no easy bruising or excessive bleeding.   Psychiatric - no severe  anxiety or insomnia.  No confusion.   All other review of systems are negative.      Objective  Vital Signs - BP 140/82  Pulse 64  Ht 5' (1.524 m)  Wt 211 lb (95.709 kg)  BMI 41.21 kg/m2  General - Valerie Nicholson is alert, cooperative, and pleasant.  Well  groomed.  No acute distress.    Body habitus - Body mass index is 41.21 kg/(m^2).  HEENT - Head is normocephalic. No circumoral cyanosis.  Dentition is normal.  EYES -   Lids normal without ptosis.  No discharge, edema or subconjunctival hemorrhage.   Neck - Symmetrical without apparent mass or lymphadenopathy.   Respiratory - Normal respiratory effort without use of accessory muscles.  Ausculatation reveals vesicular breath sounds without crackles, wheezes, rub or rhonchi.    Cardiovascular - No jugular venous distention.  Auscultation reveals regular rate and rhythm.  No audible clicks, gallop or rub.  NO murmur.  No lower extremity varicosities.  No carotid bruits.    Abdominal -  No visible distention, mass or pulsations.  Extremities - No clubbing or cyanosis.  No statis dermatitis or ulcers. No edema.    Musculoskeletal - No Osler's nodes.  No kyphosis or scoliosis.  Ambulates with assistance of walker.  Skin -  Warm and dry; no rash or pallor.   No new surgical wound.  Neurological - No focal neurological deficits.  Thought processes coherent.  No apparent tremor.   Oriented to person, place and time.    Psychiatric -  Appropriate affect and mood.     Assessment:    Stable cardiovascular status. No evidence of overt heart failure, angina or dysrhythmia  1. Aortic stenosis  ECHO Complete 2D W Doppler W Color   2. SOB (shortness of breath)  ECHO Pharmacological Stress Test, ECHO Complete 2D W Doppler W Color   3. Preoperative clearance  ECHO Pharmacological Stress Test, ECHO Complete 2D W Doppler W Color   4. Hypertension       Plan  Dr. Chase Picket consulted - Dobutamine stress echo and 2D echo ordered for preoperative clearance.   Continue current medications as prescribed.   Continue heart healthy diet.    Exercise as tolerated.     Blood pressure goal is 140/90 or less.  If you are a diabetic, the goal is 130/80 or less.   Continue to follow up with primary care provider for medical concerns.   Call with any  problems, questions or concerns.    Follow up as scheduled with Dr. Chase Picket.    Additional patient instructions:    Dobutamine Stress Test and heart ultrasound    Date/Time:  Thursday 03/11/12 @ 7:30 am.  The test will be at 8:00 am.    You may pre-register at [phone].    Register at the [facility] located on the first floor of [facility].   Enter through hospital main entrance and turn immediately to your left.    A chemical stress test uses chemical agents injected into the body through the vein.  These chemicals make the heart function as if it were under stress.  A chemical stress test is used when a traditional stress test (called a cardiac stress test) cannot be done.  Testing will take approximately one hour.      Dobutamine Stress Echocardiogram Instructions:   No caffeine 24 hours prior to the testing.  This includes: coffee, pop/soda, chocolate, cold medications, etc.  Any product  that might contain caffeine.    Do not eat or drink anything, except water, eight (8) hours before the test.   No alcohol or nicotine twelve (12) hours prior to your test.   Wear comfortable clothing.  If you are taking metoprolol, stop morning of this procedure.     If you need to change this appointment, please call outpatient scheduling at [phone].    Aloha Gell, APRN

## 2012-03-07 NOTE — Patient Instructions (Addendum)
Continue current medications as prescribed.   Continue heart healthy diet.    Exercise as tolerated.     Blood pressure goal is 140/90 or less.  If you are a diabetic, the goal is 130/80 or less.   Continue to follow up with primary care provider for medical concerns.   Call with any problems, questions or concerns.    Follow up as scheduled with Dr. Chase Picket.    Additional patient instructions:    Dobutamine Stress Test and heart ultrasound    Date/Time:  Thursday 03/11/12 @ 7:30 am.  The test will be at 8:00 am.    You may pre-register at [phone].    Register at the [facility] located on the first floor of [facility].   Enter through hospital main entrance and turn immediately to your left.    A chemical stress test uses chemical agents injected into the body through the vein.  These chemicals make the heart function as if it were under stress.  A chemical stress test is used when a traditional stress test (called a cardiac stress test) cannot be done.  Testing will take approximately one hour.      Dobutamine Stress Echocardiogram Instructions:   No caffeine 24 hours prior to the testing.  This includes: coffee, pop/soda, chocolate, cold medications, etc.  Any product that might contain caffeine.    Do not eat or drink anything, except water, eight (8) hours before the test.   No alcohol or nicotine twelve (12) hours prior to your test.   Wear comfortable clothing.  If you are taking metoprolol, stop morning of this procedure.     If you need to change this appointment, please call outpatient scheduling at [phone].

## 2012-03-17 NOTE — Telephone Encounter (Signed)
Valerie Nicholson called to ask several questions prior to her CE surgery on 11/20.  She was concerned about her thyroid, pain in her neck and about having carotid surgery in this area.  I explained to her the anatomy of the thyroid and carotids as well as that her last CT showed an enlarged thyroid and that Dr. Allyne Gee was aware of this.  She also had questions about complications from prior anesthesia. I advised her that [facility], PA-C had noted this in her chart as well as getting cardiac clearance from Dr. Chase Picket.  She was appreciative that talked with her.

## 2012-03-19 ENCOUNTER — Inpatient Hospital Stay
Admit: 2012-03-19 | Disposition: A | Discharge status: Home / Self Care | Attending: Vascular Surgery | Admitting: Vascular Surgery

## 2012-03-19 LAB — TYPE AND SCREEN: ABO Check: A NEG

## 2012-03-19 LAB — GLUCOSE TOLERANCE TEST
Glucose Accuchek: 108 MG/DL
Glucose Accuchek: 97 MG/DL
Glucose Accuchek: 181 MG/DL

## 2012-03-20 LAB — CBC PANEL ITEM
Basophils %: 0.2 % (ref 0–1)
Basophils: 0.01 10*3/uL (ref 0.0–0.20)
Eosinophils %: 0.1 10*3/uL (ref 0.0–0.60)
Eosinophils %: 1.5 % (ref 1–5)
Hematocrit: 26.2 % — ABNORMAL LOW (ref 37–47)
Hemoglobin: 9.2 G/DL — ABNORMAL LOW (ref 12.0–16.0)
Lymphocytes: 1.1 10*3/uL (ref 1.1–4.5)
Lymphocytes: 16.9 % — ABNORMAL LOW (ref 20–40)
MCH: 29.9 PG (ref 27–31)
MCHC: 35.1 G/DL (ref 33–37)
MCV: 85.1 FL (ref 81–99)
MPV: 9.5 FL — ABNORMAL LOW (ref 10.2–13.2)
Monocytes %: 9.8 % (ref 0–10)
Monocytes: 0.64 10*3/uL (ref 0.0–0.9)
Neutrophils %: 71.6 % — ABNORMAL HIGH (ref 50–70)
Neutrophils Absolute: 4.67 10*3/uL (ref 1.5–7.5)
Platelets: 144 10*3/uL (ref 130–400)
RBC: 3.08 MIL/uL — ABNORMAL LOW (ref 4.2–5.4)
RDW: 13.3 % (ref 11.0–14.0)
WBC: 6.52 10*3/uL (ref 4.8–10.8)

## 2012-03-20 LAB — BASIC METABOLIC PANEL
Anion Gap: 10 mmol/L (ref 7–19)
BUN: 18 MG/DL (ref 5–20)
CO2: 23 mmol/L (ref 22–29)
Calcium: 7.3 MG/DL — ABNORMAL LOW (ref 8.8–10.2)
Chloride: 100 mmol/L (ref 98–111)
Creatinine: 0.8 MG/DL (ref 0.5–0.9)
Gfr Calculated: 74 mL/min (ref 59–300)
Glucose: 164 MG/DL
Potassium: 3.7 mmol/L (ref 3.5–5.0)
Sodium: 133 mmol/L — ABNORMAL LOW (ref 136–145)

## 2012-03-20 LAB — PREALBUMIN: Prealbumin: 15 MG/DL — ABNORMAL LOW (ref 20–40)

## 2012-03-20 LAB — GLUCOSE TOLERANCE TEST
Glucose Accuchek: 152 MG/DL
Glucose Accuchek: 219 MG/DL

## 2012-03-21 LAB — GLUCOSE TOLERANCE TEST: Glucose Accuchek: 186 MG/DL

## 2012-04-02 ENCOUNTER — Encounter

## 2012-04-02 MED ORDER — OLMESARTAN MEDOXOMIL 40 MG PO TABS
40 MG | ORAL_TABLET | Freq: Every day | ORAL | Status: DC
Start: 2012-04-02 — End: 2012-11-25

## 2012-04-02 NOTE — Progress Notes (Signed)
Valerie Nicholson is a  78 y.o. female who presents for post op evaluation.  She had a left  carotid endarterectomy on 03/20/12 by Dr. Allyne Gee.   She reports no symptoms since surgery.  Post op complications reported none.      On evaluation today, incision is clean, dry, intact.  Symptoms of infection are absent.    She will need to continue ASA 81 mg po qd, clopidogrel 75 mg po qd.  She is to [facility] incision daily with soap and water and cover with dry gauze until healed and wash incision twice daily with soap and H2O.  We will follow up with her in 3 months with a CDU.  As always, we want to thank you for allowing [place] to participate in the care of your patients.      Sincerely,     Sandy Salaam, PA-C

## 2012-04-09 LAB — COMPREHENSIVE PANEL
ALT: 16 U/L (ref 5–33)
AST: 20 U/L (ref 5–32)
Albumin: 3.8 G/DL (ref 3.5–5.2)
Alkaline Phosphatase: 67 U/L (ref 35–187)
Anion Gap: 12 mmol/L (ref 7–19)
BUN: 12 MG/DL (ref 5–20)
CO2: 27 mmol/L (ref 22–29)
Calcium: 8.8 MG/DL (ref 8.8–10.2)
Chloride: 98 mmol/L (ref 98–111)
Creatinine: 0.7 MG/DL (ref 0.5–0.9)
Gfr Calculated: 86 mL/min (ref 59–300)
Glucose: 130 MG/DL
Potassium: 3.8 mmol/L (ref 3.5–5.0)
Sodium: 137 mmol/L (ref 136–145)
Total Bilirubin: 0.6 MG/DL (ref 0.3–1.2)
Total Protein: 6.8 G/DL (ref 6.6–8.7)

## 2012-04-09 LAB — CBC PANEL ITEM
Basophils %: 0.3 % (ref 0–1)
Basophils: 0.02 10*3/uL (ref 0.0–0.20)
Eosinophils %: 0.14 10*3/uL (ref 0.0–0.60)
Eosinophils %: 2.2 % (ref 1–5)
Hematocrit: 33.4 % — ABNORMAL LOW (ref 37–47)
Hemoglobin: 11.3 G/DL — ABNORMAL LOW (ref 12.0–16.0)
Lymphocytes: 0.86 10*3/uL — ABNORMAL LOW (ref 1.1–4.5)
Lymphocytes: 13.5 % — ABNORMAL LOW (ref 20–40)
MCH: 28.9 PG (ref 27–31)
MCHC: 33.8 G/DL (ref 33–37)
MCV: 85.4 FL (ref 81–99)
MPV: 9.5 FL — ABNORMAL LOW (ref 10.2–13.2)
Monocytes %: 7.2 % (ref 0–10)
Monocytes: 0.46 10*3/uL (ref 0.0–0.9)
Neutrophils %: 76.8 % — ABNORMAL HIGH (ref 50–70)
Neutrophils Absolute: 4.91 10*3/uL (ref 1.5–7.5)
Platelets: 216 10*3/uL (ref 130–400)
RBC: 3.91 MIL/uL — ABNORMAL LOW (ref 4.2–5.4)
RDW: 13.4 % (ref 11.0–14.0)
WBC: 6.39 10*3/uL (ref 4.8–10.8)

## 2012-04-09 LAB — POCT TROPONIN: POC Troponin I: 0.01 NG/ML (ref 0.000–0.08)

## 2012-04-09 NOTE — Telephone Encounter (Signed)
Mrs . Cupo had call on 04-08-12 stated her ear and neck was hurting on the right side , her surgery was on the other side, i had told her to go to her primary doctor, to discuss her problems with her ear and neck, i did dicuss her problems with penny, and that's what she told me to tell her.

## 2012-04-15 NOTE — Telephone Encounter (Signed)
I called Mrs. Boy on 04/15/12 just to check on patient, was having trouble with ear and throat, she is on pain medication, and antibiotic, feels much better.

## 2012-07-07 NOTE — Progress Notes (Signed)
Patient Care Team:  Verlin Dike as PCP - General (Internal Medicine)  [name], MD as PCP - Cardiology (Cardiology)  Kermit Balo (Gastroenterology)  Johny Shears      Subjective    She presents for follow-up of carotid artery stenosis. She has prior history of carotid occlusive disease for 1 - 5 years. Her current treatment includes ASA EC and Plavix daily.  She has no history of CVA.  She reports no TIA's, episodes of amaurosis fugax and episodes of lateralizing weakness. She has some concerns about how her surgery went.     Valerie Nicholson is a 79 y.o. female with the following history reviewed and recorded in [employer]:  Patient Active Problem List    Diagnosis Date Noted   . Diabetes mellitus 07/07/2012   . Barrett esophagus 01/07/2012   . GERD (gastroesophageal reflux disease) 01/07/2012   . History of colon polyps 01/07/2012   . High risk for colon cancer 01/07/2012   . Dysphagia 01/07/2012   . Preop cardiovascular exam 07/04/2011   . Carotid artery stenosis 06/21/2011   . Aortic stenosis 01/24/2010   . HYPERTENSION    . Family history of colon cancer 12/16/2009     PT HAS STRONG FAMILY HISTORY OF CLN CA (MOM, GRANDFATHER, 2 BROTHERS, COUSINS SON)     . Abdominal pain, acute, generalized 12/16/2009     Current Outpatient Prescriptions   Medication Sig Dispense Refill   . pirbuterol (MAXAIR AUTOHALER) 200 MCG/INH inhaler Inhale 2 puffs into the lungs 4 times daily.       . Olopatadine HCl (PATANASE NA) by Nasal route as needed.       Marland Kitchen olmesartan (BENICAR) 40 MG tablet Take 1 tablet by mouth daily.  30 tablet  5   . pantoprazole (PROTONIX) 40 MG tablet Take 40 mg by mouth 2 times daily.       Marland Kitchen therapeutic multivitamin-minerals (THERAGRAN-M) tablet Take 1 tablet by mouth daily.       Marland Kitchen atenolol (TENORMIN) 50 MG tablet Take 50 mg by mouth 2 times daily.       Marland Kitchen atorvastatin (LIPITOR) 80 MG tablet Take 80 mg by mouth daily.       . clopidogrel (PLAVIX) 75 MG tablet Take 75 mg by  mouth daily.       Marland Kitchen glipiZIDE (GLUCOTROL) 5 MG tablet Take 5 mg by mouth daily.       . metformin (GLUCOPHAGE-XR) 500 MG XR tablet Take 500 mg by mouth daily.       . montelukast (SINGULAIR) 10 MG tablet Take 10 mg by mouth Daily.       . budesonide-formoterol (SYMBICORT) 160-4.5 MCG/ACT AERO Inhale 2 puffs into the lungs 2 times daily.         . Loratadine (CLARITIN PO) Take 5 mg by mouth 2 times daily.       . ASPIRIN PO Take 81 mg by mouth daily.       Marland Kitchen CALCIUM PO Take 600 mg by mouth 2 times daily.         No current facility-administered medications for this visit.     Current Outpatient Prescriptions on File Prior to Visit   Medication Sig Dispense Refill   . pirbuterol (MAXAIR AUTOHALER) 200 MCG/INH inhaler Inhale 2 puffs into the lungs 4 times daily.       . Olopatadine HCl (PATANASE NA) by Nasal route as needed.       Marland Kitchen olmesartan (  BENICAR) 40 MG tablet Take 1 tablet by mouth daily.  30 tablet  5   . pantoprazole (PROTONIX) 40 MG tablet Take 40 mg by mouth 2 times daily.       Marland Kitchen therapeutic multivitamin-minerals (THERAGRAN-M) tablet Take 1 tablet by mouth daily.       Marland Kitchen atenolol (TENORMIN) 50 MG tablet Take 50 mg by mouth 2 times daily.       Marland Kitchen atorvastatin (LIPITOR) 80 MG tablet Take 80 mg by mouth daily.       . clopidogrel (PLAVIX) 75 MG tablet Take 75 mg by mouth daily.       Marland Kitchen glipiZIDE (GLUCOTROL) 5 MG tablet Take 5 mg by mouth daily.       . metformin (GLUCOPHAGE-XR) 500 MG XR tablet Take 500 mg by mouth daily.       . montelukast (SINGULAIR) 10 MG tablet Take 10 mg by mouth Daily.       . budesonide-formoterol (SYMBICORT) 160-4.5 MCG/ACT AERO Inhale 2 puffs into the lungs 2 times daily.         . Loratadine (CLARITIN PO) Take 5 mg by mouth 2 times daily.       . ASPIRIN PO Take 81 mg by mouth daily.       Marland Kitchen CALCIUM PO Take 600 mg by mouth 2 times daily.         No current facility-administered medications on file prior to visit.     Allergies: Pcn; Darvocet; Fentanyl; Meloxicam; Morphine; Septra;  Ultracet; and Zanaflex  Past Medical History   Diagnosis Date   . Lipoma    . Colon polyps      with carpeting polyp in 2011   . Hyperthyroidism    . HH (hiatus hernia)    . Hypertension    . Barrett's esophagus    . Allergic rhinitis    . Osteoarthritis    . TIA (transient ischemic attack)    . Asthma    . Obesity    . Spinal stenosis    . Aortic stenosis 01/24/2010   . Shortness of breath    . Chest pain    . Transient cerebral ischemia    . Chronic back pain    . DVT (deep venous thrombosis)    . Hyperlipidemia    . Hypothyroidism    . Neuropathy    . Carotid artery stenosis 06/21/2011   . Arthritis    . Neck pain    . Thyroid disorder    . GERD (gastroesophageal reflux disease) 01/07/2012   . High blood pressure    . Diabetes mellitus    . Sigmoid diverticulosis    . Dysphagia        Past Surgical History   Procedure Laterality Date   . Carpal tunnel release     . Hemorrhoid surgery     . Knee arthroscopy       BILATERAL   . Cataract removal     . Colonoscopy  07-12-09     Dr Beckey Downing   . Upper gastrointestinal endoscopy  12-31-08     Dr Beckey Downing   . Appendectomy     . Nasal septum surgery       repair   . Cholecystectomy     . Colonoscopy  11-06-05     Dr Beckey Downing   . Colonoscopy  12-17-03     Dr Beckey Downing   . Upper gastrointestinal endoscopy  12-21-03     Dr Beckey Downing   .  Upper gastrointestinal endoscopy  10-30-05     Dr Beckey Downing   . Vascular surgery  03-20-12 [facility]     L carotid endarterectomy with Vascu-Guard patch repair. L cervical carotid arteriograms after endarterectomy   . Colonoscopy  01-14-09     Dr Beckey Downing     Family History   Problem Relation Age of Onset   . Colon Cancer Mother    . Colon Cancer Brother    . Colon Cancer Brother    . Cancer Maternal Aunt    . Emphysema Father    . Heart Failure Father    . Colon Cancer Maternal Grandfather      History   Substance Use Topics   . Smoking status: Former Smoker -- 1.00 packs/day for 20 years   . Smokeless tobacco: Never Used      Comment: Stopped 1955    . Alcohol Use: No           Review of Systems    Constitutional - no significant activity change, appetite change, or unexpected weight change.  No fever or chills.  No diaphoresis or significant fatigue.  HENT - no significant rhinorrhea or epistaxis.  No tinnitus or significant hearing loss.  Eyes - no sudden vision change or amaurosis.  Respiratory - no significant shortness of breath, wheezing, or stridor.  No apnea, cough, or chest tightness associated with shortness of breath.  Cardiovascular - no chest pain, syncope, or significant dizziness.  No palpitations or significant leg swelling.  No claudication.  Gastrointestinal - no abdominal swelling or pain.  No blood in stool.  No severe constipation, diarrhea, nausea, or vomiting.  Genitourinary - No difficulty urinating, dysuria, frequency, or urgency.  No flank pain or hematuria.  Musculoskeletal - no back pain, gait disturbance, or myalgia.  Skin - no color change, rash, pallor, or new wound.  Neurologic - no dizziness, facial asymmetry, or light headedness.  No seizures.  No speech difficulty or lateralizing weakness.  Hematologic - no easy bruising or excessive bleeding.  Psychiatric - no severe anxiety or nervousness.  No confusion.  All other review of systems are negative.    Physical Exam    BP 190/68  Pulse 59  Temp(Src) 96.6 F (35.9 C)  Resp 18      Constitutional - well developed, well nourished.  No diaphoresis or acute distress.  HENT - head normocephalic.  Right external ear canal appears normal.  Left external ear canal appears normal.  Septum appears midline.  Eyes - conjunctiva normal.  EOM'S normal.  No exudate.  No icterus.  Neck- ROM appears normal, no tracheal deviation.  Cardiovascular - Regular rate and rhythm.  Heart sounds are normal.  No murmur, rub, or gallop.  Carotid pulses are 2+ to palpation bilaterally without bruit.   Pulmonary - effort appears normal.  No respiratory distress.    Lungs - Breath sounds normal. No  wheezes or rales.    Extremities - Radial and brachial pulses are 2+ to palpation bilaterally.    Neurologic - alert and oriented X 3.  Physiologic. Tongue midline.  Face symmetric.  Skin - warm, dry, and intact.  No rash, erythema, or pallor.  Psychiatric - mood, affect, and behavior appear normal.  Judgment and thought processes appear normal.    Risk factors for atherosclerosis of all vascular beds have been reviewed with the patient including:  Family history, tobacco abuse in all forms, elevated cholesterol, hyperlipidemia, and diabetes.    Doppler results:  Right CCA/ICA <50% stenotic  Left CCA/ICA <50% stenotic  Right vertebral artery flow is antegrade  Left vertebral artery flow is antegrade  Individual velocities reviewed: Yes.  Test results were reviewed with the patient.    Options have been discussed with the patient including continued medical management.  Patient has opted to proceed with continued medical management. I spoke for a lengthy time with the patient and discussed some of her concerns were normal post op symptoms.  She still has questions for Dr. Allyne Gee and would really like to speak with him.  I advised her I would talk with him.    Assessment    1. Carotid artery stenosis          Plan    Start/Continue ASA EC 81 mg daily  plavix 75 mg po daily  Strongly encourage start/continue statin therapy  Recommend no smoking  Follow up in  6  Month(s) with CVLS  Patient instructed to call or proceed to the emergency room with any symptoms of lateralizing weakness, loss of vision in one eye, or episodes slurred speech.      Addendum:   Discussed with Dr. Allyne Gee and she is to come into the office next week to see him.

## 2012-07-18 ENCOUNTER — Encounter

## 2012-07-18 NOTE — Progress Notes (Signed)
Patient Care Team:  Verlin Dike as PCP - General (Internal Medicine)  [name], MD as PCP - Cardiology (Cardiology)  Kermit Balo (Gastroenterology)  Johny Shears      Subjective    She presents for follow-up of carotid artery stenosis. She has prior history of carotid occlusive disease for 1 - 5 years. Her current treatment includes ASA EC daily.  She denies a history of CVA.  She reports no TIA's, episodes of amaurosis fugax and episodes of lateralizing weakness.  She comes in today to talk with Dr. Allyne Gee and has multiple questions.  She reports that she has had right ear pain (opposite side of CE).  This started sometime after surgery and is concerned something was in her ear.  Dr. Allyne Gee discussed with her that nothing was in the ear.  There should be no connection between the right ear and surgery on the left side.  She also wanted to discuss an episode of nausea post op in recovery.  Dr. Allyne Gee discussed with her that this is not uncommon and discussed medications given to her for her nausea at that time.      Valerie Nicholson is a 79 y.o. female with the following history reviewed and recorded in [employer]:  Patient Active Problem List    Diagnosis Date Noted   . Diabetes mellitus 07/07/2012   . Barrett esophagus 01/07/2012   . GERD (gastroesophageal reflux disease) 01/07/2012   . History of colon polyps 01/07/2012   . High risk for colon cancer 01/07/2012   . Dysphagia 01/07/2012   . Preop cardiovascular exam 07/04/2011   . Carotid artery stenosis 06/21/2011   . Aortic stenosis 01/24/2010   . HYPERTENSION    . Family history of colon cancer 12/16/2009     PT HAS STRONG FAMILY HISTORY OF CLN CA (MOM, GRANDFATHER, 2 BROTHERS, COUSINS SON)     . Abdominal pain, acute, generalized 12/16/2009     Current Outpatient Prescriptions   Medication Sig Dispense Refill   . pirbuterol (MAXAIR AUTOHALER) 200 MCG/INH inhaler Inhale 2 puffs into the lungs 4 times daily.       . Olopatadine  HCl (PATANASE NA) by Nasal route as needed.       Marland Kitchen olmesartan (BENICAR) 40 MG tablet Take 1 tablet by mouth daily.  30 tablet  5   . pantoprazole (PROTONIX) 40 MG tablet Take 40 mg by mouth 2 times daily.       Marland Kitchen therapeutic multivitamin-minerals (THERAGRAN-M) tablet Take 1 tablet by mouth daily.       Marland Kitchen atenolol (TENORMIN) 50 MG tablet Take 50 mg by mouth 2 times daily.       Marland Kitchen atorvastatin (LIPITOR) 80 MG tablet Take 80 mg by mouth daily.       . clopidogrel (PLAVIX) 75 MG tablet Take 75 mg by mouth daily.       Marland Kitchen glipiZIDE (GLUCOTROL) 5 MG tablet Take 5 mg by mouth daily.       . metformin (GLUCOPHAGE-XR) 500 MG XR tablet Take 500 mg by mouth daily.       . montelukast (SINGULAIR) 10 MG tablet Take 10 mg by mouth Daily.       . budesonide-formoterol (SYMBICORT) 160-4.5 MCG/ACT AERO Inhale 2 puffs into the lungs 2 times daily.         . Loratadine (CLARITIN PO) Take 5 mg by mouth 2 times daily.       . ASPIRIN PO Take  81 mg by mouth daily.       Marland Kitchen CALCIUM PO Take 600 mg by mouth 2 times daily.         No current facility-administered medications for this visit.     Current Outpatient Prescriptions on File Prior to Visit   Medication Sig Dispense Refill   . pirbuterol (MAXAIR AUTOHALER) 200 MCG/INH inhaler Inhale 2 puffs into the lungs 4 times daily.       . Olopatadine HCl (PATANASE NA) by Nasal route as needed.       Marland Kitchen olmesartan (BENICAR) 40 MG tablet Take 1 tablet by mouth daily.  30 tablet  5   . pantoprazole (PROTONIX) 40 MG tablet Take 40 mg by mouth 2 times daily.       Marland Kitchen therapeutic multivitamin-minerals (THERAGRAN-M) tablet Take 1 tablet by mouth daily.       Marland Kitchen atenolol (TENORMIN) 50 MG tablet Take 50 mg by mouth 2 times daily.       Marland Kitchen atorvastatin (LIPITOR) 80 MG tablet Take 80 mg by mouth daily.       . clopidogrel (PLAVIX) 75 MG tablet Take 75 mg by mouth daily.       Marland Kitchen glipiZIDE (GLUCOTROL) 5 MG tablet Take 5 mg by mouth daily.       . metformin (GLUCOPHAGE-XR) 500 MG XR tablet Take 500 mg by mouth  daily.       . montelukast (SINGULAIR) 10 MG tablet Take 10 mg by mouth Daily.       . budesonide-formoterol (SYMBICORT) 160-4.5 MCG/ACT AERO Inhale 2 puffs into the lungs 2 times daily.         . Loratadine (CLARITIN PO) Take 5 mg by mouth 2 times daily.       . ASPIRIN PO Take 81 mg by mouth daily.       Marland Kitchen CALCIUM PO Take 600 mg by mouth 2 times daily.         No current facility-administered medications on file prior to visit.     Allergies: Pcn; Darvocet; Fentanyl; Meloxicam; Morphine; Septra; Ultracet; and Zanaflex  Past Medical History   Diagnosis Date   . Lipoma    . Colon polyps      with carpeting polyp in 2011   . Hyperthyroidism    . HH (hiatus hernia)    . Hypertension    . Barrett's esophagus    . Allergic rhinitis    . Osteoarthritis    . TIA (transient ischemic attack)    . Asthma    . Obesity    . Spinal stenosis    . Aortic stenosis 01/24/2010   . Shortness of breath    . Chest pain    . Transient cerebral ischemia    . Chronic back pain    . DVT (deep venous thrombosis)    . Hyperlipidemia    . Hypothyroidism    . Neuropathy    . Carotid artery stenosis 06/21/2011   . Arthritis    . Neck pain    . Thyroid disorder    . GERD (gastroesophageal reflux disease) 01/07/2012   . High blood pressure    . Diabetes mellitus    . Sigmoid diverticulosis    . Dysphagia        Past Surgical History   Procedure Laterality Date   . Carpal tunnel release     . Hemorrhoid surgery     . Knee arthroscopy       BILATERAL   .  Cataract removal     . Colonoscopy  07-12-09     Dr Beckey Downing   . Upper gastrointestinal endoscopy  12-31-08     Dr Beckey Downing   . Appendectomy     . Nasal septum surgery       repair   . Cholecystectomy     . Colonoscopy  11-06-05     Dr Beckey Downing   . Colonoscopy  12-17-03     Dr Beckey Downing   . Upper gastrointestinal endoscopy  12-21-03     Dr Beckey Downing   . Upper gastrointestinal endoscopy  10-30-05     Dr Beckey Downing   . Vascular surgery  03-20-12 [facility]     L carotid endarterectomy with Vascu-Guard patch  repair. L cervical carotid arteriograms after endarterectomy   . Colonoscopy  01-14-09     Dr Beckey Downing     Family History   Problem Relation Age of Onset   . Colon Cancer Mother    . Colon Cancer Brother    . Colon Cancer Brother    . Cancer Maternal Aunt    . Emphysema Father    . Heart Failure Father    . Colon Cancer Maternal Grandfather      History   Substance Use Topics   . Smoking status: Former Smoker -- 1.00 packs/day for 20 years   . Smokeless tobacco: Never Used      Comment: Stopped 1955   . Alcohol Use: No           Review of Systems    Constitutional - no significant activity change, appetite change, or unexpected weight change.  No fever or chills.  No diaphoresis or significant fatigue.  HENT - no significant rhinorrhea or epistaxis.  Has right ear pain ongoing.  Has even been to ER for it.    Eyes - no sudden vision change or amaurosis.  Respiratory - no significant shortness of breath, wheezing, or stridor.  No apnea, cough, or chest tightness associated with shortness of breath.  Cardiovascular - no chest pain, syncope, or significant dizziness.  No palpitations or significant leg swelling.  No claudication.  Gastrointestinal - no abdominal swelling or pain.  No blood in stool.  No severe constipation, diarrhea, nausea, or vomiting.  Genitourinary - No difficulty urinating, dysuria, frequency, or urgency.  No flank pain or hematuria.  Musculoskeletal - no back pain, gait disturbance, or myalgia.  Skin - no color change, rash, pallor, or new wound.  Neurologic - no dizziness, facial asymmetry, or light headedness.  No seizures.  No speech difficulty or lateralizing weakness.  Hematologic - no easy bruising or excessive bleeding.  Psychiatric - no severe anxiety or nervousness.  No confusion.  All other review of systems are negative.    Physical Exam    Filed Vitals:    07/17/12 1100 07/17/12 1105   BP: 194/81 199/79   Pulse: 59 57   Temp: 97.3 F (36.3 C)    Resp: 18          Constitutional -  well developed, well nourished.  No diaphoresis or acute distress.  HENT - head normocephalic.  Right external ear canal appears normal.  Left external ear canal appears normal.  Septum appears midline.  Eyes - conjunctiva normal.  EOM'S normal.  No exudate.  No icterus.  Neck- ROM appears normal, no tracheal deviation.  Cardiovascular - Regular rate and rhythm.  Heart sounds are normal.  No murmur, rub, or gallop.  Carotid pulses are  2+ to palpation bilaterally without bruit.   Pulmonary - effort appears normal.  No respiratory distress.    Lungs - Breath sounds normal. No wheezes or rales.    Extremities - Radial and brachial pulses are 2+ to palpation bilaterally.    Neurologic - alert and oriented X 3.  Physiologic. Tongue midline.  Face symmetric.  Skin - warm, dry, and intact.  No rash, erythema, or pallor.  Psychiatric - mood, affect, and behavior appear normal.  Judgment and thought processes appear normal.    Risk factors for atherosclerosis of all vascular beds have been reviewed with the patient including:  Family history, tobacco abuse in all forms, elevated cholesterol, hyperlipidemia, and diabetes.    Doppler results:    Right CCA/ICA <50% stenotic  Left CCA/ICA <50% stenotic  Right vertebral artery flow is antegrade  Left vertebral artery flow is antegrade  Individual velocities reviewed: Yes.  Test results were reviewed with the patient.    Options have been discussed with the patient including continued medical management.  Patient has opted to proceed with continued medical management.    Assessment    1. Carotid artery stenosis          Plan    Start/Continue ASA EC 81 mg daily  Follow up in  12  Month(s)  Strongly encourage start/continue statin therapy  Recommend no smoking  Patient instructed to call or proceed to the emergency room with any symptoms of lateralizing weakness, loss of vision in one eye, or episodes slurred speech.  Will refer to ENT for right ear pain

## 2012-07-21 NOTE — Progress Notes (Signed)
Subjective:      Patient ID: Valerie Nicholson is a 79 y.o. female.  Chief Complaint   Patient presents with   . Colonoscopy     3 year recall   . Endoscopy     3 year recall   . Rectal Bleeding     with constipation in the last week   . Constipation       HPI  PCP: Dr Loura Back  Here for f/u colonoscopy (hx polyps; high risk for colon CA) and EGD (hx Barretts).  Pt does admit to chronic constipation. Has been able to make changes in diet to include increased granola and fiber, which has controlled it until approx the last 3 weeksago. Also admits to comorbid   rectal bleeding- blood on tissue when she wipes and in the toilet bowel, not mixed in stool. No mucous in stool, no abd pain, no unintentional weight loss  Also admits to chronic GERD, which is controlled well. Also admits to occasional problem with dyphagia only with drinking ice cold water. If she warms the water up prior to drinking it--no problems.  No melena, no odynophagia.     Last colonoscopy  01/14/2009 ([facility])-high risk for colon CA  (1) polyp mid ascending--adenomatous-tubular type    Last EGD 12/31/2008 ([facility])-hx Barretts  (1) CLO negative  (2) distal esophageal bx: benign squamocolumnar junction mucosa-chronic inflammation. No dysplasia. No intestinal metaplasia.    Family HX: Mother, brother #1, brother #2, and maternal grandfather with hx colon cancer.   Pt denies family hx of colon polyps, inflammatory bowel dx, gastric CA and esophageal CA.    Past Medical History   Diagnosis Date   . Lipoma    . Colon polyps      with carpeting polyp in 2011   . Hyperthyroidism    . HH (hiatus hernia)    . Hypertension    . Barrett's esophagus    . Allergic rhinitis    . Osteoarthritis    . TIA (transient ischemic attack)    . Asthma    . Obesity    . Spinal stenosis    . Aortic stenosis 01/24/2010   . Shortness of breath    . Chest pain    . Transient cerebral ischemia    . Chronic back pain    . DVT (deep venous thrombosis)    . Hyperlipidemia    .  Hypothyroidism    . Neuropathy    . Carotid artery stenosis 06/21/2011     left   . Arthritis    . Neck pain    . Thyroid disorder    . GERD (gastroesophageal reflux disease) 01/07/2012   . High blood pressure    . Diabetes mellitus    . Sigmoid diverticulosis    . Dysphagia    . Constipation    . Sore throat      Past Surgical History   Procedure Laterality Date   . Carpal tunnel release     . Hemorrhoid surgery  1983   . Knee arthroscopy       BILATERAL   . Cataract removal     . Colonoscopy  07-12-09     Dr Beckey Downing   . Upper gastrointestinal endoscopy  12-31-08     Dr Beckey Downing   . Appendectomy     . Nasal septum surgery       repair   . Cholecystectomy     . Colonoscopy  11-06-05  Dr Beckey Downing   . Colonoscopy  12-17-03     Dr Beckey Downing   . Upper gastrointestinal endoscopy  12-21-03     Dr Beckey Downing   . Upper gastrointestinal endoscopy  10-30-05     Dr Beckey Downing   . Vascular surgery  03-20-12 [facility]     L carotid endarterectomy with Vascu-Guard patch repair. L cervical carotid arteriograms after endarterectomy   . Colonoscopy  01-14-09     Dr Beckey Downing   . Upper gastrointestinal endoscopy  12-31-08     Dr Beckey Downing   . Carotid endarterectomy  03-19-12     Dr Allyne Gee     History     Social History   . Marital Status: Married     Spouse Name: N/A     Number of Children: N/A   . Years of Education: N/A     Social History Main Topics   . Smoking status: Former Smoker -- 1.00 packs/day for 20 years   . Smokeless tobacco: Never Used      Comment: Stopped 1955   . Alcohol Use: No   . Drug Use: No   . Sexually Active: None     Other Topics Concern   . None     Social History Narrative   . None     Current Outpatient Prescriptions on File Prior to Visit   Medication Sig Dispense Refill   . Olopatadine HCl (PATANASE NA) by Nasal route as needed.       Marland Kitchen olmesartan (BENICAR) 40 MG tablet Take 1 tablet by mouth daily.  30 tablet  5   . pantoprazole (PROTONIX) 40 MG tablet Take 40 mg by mouth 2 times daily.       Marland Kitchen therapeutic  multivitamin-minerals (THERAGRAN-M) tablet Take 1 tablet by mouth daily.       Marland Kitchen atenolol (TENORMIN) 50 MG tablet Take 50 mg by mouth 2 times daily.       Marland Kitchen atorvastatin (LIPITOR) 80 MG tablet Take 80 mg by mouth daily.       . clopidogrel (PLAVIX) 75 MG tablet Take 75 mg by mouth daily.       Marland Kitchen glipiZIDE (GLUCOTROL) 5 MG tablet Take 5 mg by mouth 2 times daily (before meals).       . metformin (GLUCOPHAGE-XR) 500 MG XR tablet Take 500 mg by mouth daily.       . montelukast (SINGULAIR) 10 MG tablet Take 10 mg by mouth Daily.       . budesonide-formoterol (SYMBICORT) 160-4.5 MCG/ACT AERO Inhale 2 puffs into the lungs 2 times daily.         . Loratadine (CLARITIN PO) Take 5 mg by mouth 2 times daily.       . ASPIRIN PO Take 81 mg by mouth daily.       Marland Kitchen CALCIUM PO Take 600 mg by mouth 2 times daily.         No current facility-administered medications on file prior to visit.       Review of Systems   Constitutional: Negative for fever, appetite change, fatigue and unexpected weight change.   HENT: Negative for hearing loss and sore throat.    Eyes: Negative for photophobia and visual disturbance.   Respiratory: Negative for cough, choking and wheezing.    Cardiovascular: Negative for chest pain, palpitations and leg swelling.   Gastrointestinal: Positive for constipation and anal bleeding. Negative for nausea, vomiting, abdominal pain, diarrhea, blood in stool, abdominal distention and rectal pain.  Genitourinary: Negative for dysuria and hematuria.   Musculoskeletal: Positive for arthralgias (chronic/controlled). Negative for myalgias and back pain.   Skin: Negative for rash.   Allergic/Immunologic: Negative for immunocompromised state.   Neurological: Negative for dizziness and seizures.   Hematological: Does not bruise/bleed easily.   Psychiatric/Behavioral: Negative for confusion and dysphoric mood. The patient is not nervous/anxious.        Objective:   Physical Exam   Nursing note and vitals  reviewed.  Constitutional: She is oriented to person, place, and time. She appears well-developed and well-nourished.   HENT:   Head: Normocephalic and atraumatic.   Eyes: Conjunctivae and EOM are normal. Pupils are equal, round, and reactive to light.   Neck: Normal range of motion. Neck supple. No thyromegaly present.   Cardiovascular: Normal rate, regular rhythm and normal heart sounds.  Exam reveals no gallop and no friction rub.    No murmur heard.  Pulmonary/Chest: Effort normal and breath sounds normal. No respiratory distress.   Abdominal: Soft. Bowel sounds are normal. She exhibits no distension. There is no tenderness. There is no rebound.   Musculoskeletal: Normal range of motion. She exhibits edema (bilateral lower extremities; 1+).   Neurological: She is alert and oriented to person, place, and time. No cranial nerve deficit.   Skin: Skin is warm and dry. No pallor.   Psychiatric: She has a normal mood and affect. Judgment normal.       Assessment:      1. High risk for colon cancer  COLONOSCOPY W/ OR W/O BIOPSY   2. Colon polyps  COLONOSCOPY W/ OR W/O BIOPSY   3. Constipation  COLONOSCOPY W/ OR W/O BIOPSY   4. Hematochezia  COLONOSCOPY W/ OR W/O BIOPSY   5. Barrett's esophagus  ESOPHAGOSCOPY / EGD   6. GERD (gastroesophageal reflux disease)  ESOPHAGOSCOPY / EGD   7. Chronic anticoagulation     8. Arterial vascular disease     9. Dysphagia  ESOPHAGOSCOPY / EGD            Plan:      1. Schedule outpatient colonoscopy with MAC anesthesia.  Patient advised no Aspirin, Fish Oil, Vit E or NSAIDs 5 (five) days before procedure.  Follow-up Visit: per Dr  Beckey Downing    Obtain clearance from Dr. Loura Back  to stop  PLAVIX  Clearance for discontinuing anticoagulants is needed before scheduling procedure. Increased risks may be associated with discontinuation of this medication including stroke, TIA, and cardiac event. I have discussed this with patient. Patient voices understanding and agrees to proceed with  scheduling.  Pt education:  Risks, benefits, and alternatives to colonoscopy were discussed. Risks of colonoscopy include, but are not limited to, perforation, bleeding, and infection, Risk of perforation and bleeding are increased if there is a polyp removed. All questions answered to the satisfaction of the patient. Pt is agreeable to proceed.    2. Schedule outpatient endoscopy with MAC anesthesia.  Patient advised no Aspirin, Fish Oil, Vit E or NSAIDs 5 (five) days before procedure.  Follow-up Visit: per Dr. Beckey Downing    Obtain clearance from Dr. Loura Back  to stop PLAVIX  Clearance for discontinuing anticoagulants is needed before scheduling procedure. Increased risks may be associated with discontinuation of this medication including stroke, TIA, and cardiac event. I have discussed this with patient. Patient voices understanding and agrees to proceed with scheduling.  Pt Education:   Risks, benefits, and alternatives to endoscopy were discussed. Patient voices understanding of risks of, but  not limited to, perforation, bleeding, and infection. The risk of perforation is increased with esophageal dilatation. All questions answered to patient's satisfaction. Patient is agreable to proceed.

## 2012-07-21 NOTE — Patient Instructions (Signed)
1. You are going to have a colonoscopy and here are some instructions:  You will be given specific directions regarding restrictions to diet and bowel prep instructions including laxatives. Please read these instructions one week prior to your scheduled procedure to ensure that you are prepared.     Follow prep instructions provided for bowel prep. Take all of the bowel prep as directed. If you are having problems with nausea, stop your prep for 30-45 min to allow the nausea to subside before resuming your prep.     Nothing to eat or drink after midnight the day of the procedure EXCEPT:  PLEASE TAKE MEDICATION(S) FOR HIGH BLOOD PRESSURE, THYROID, SEIZURES, AND HEART THE MORNING OF THE PROCEDURE WITH A SIP OF WATER  AT LEAST 2 HOURS PRIOR TO ARRIVAL TIME.   YOU MAY ALSO TAKE ANY INHALERS YOU ARE PRESCRIBED.    You will not be able to drive for 24 hours after the procedure due to sedation. Bring a driver with you the day of the procedure.   No aspirin, ibuprofen, naproxen, fish oil or vitamin E for 5 days before procedure.     If you are on blood thinners, clearance from the prescribing physician will be obtained before your procedure is scheduled. Increased risks may be associated with discontinuation of your blood thinner and include, but not limited to, stroke, TIA, or cardiac event.    If polyps are removed during the procedure they will be sent to a pathologist for analysis. You will be notified by mail of the pathology results in 2-3 weeks.     Your physician may also schedule a follow up appointment with the nurse practitioner to discuss pathology, symptoms or to check if you have had any problems related to your procedure. If you prefer not to return to the office after your procedure please discuss this with your physician on the day of your colonoscopy.   Final recommendations are based on the pathologist report.     Your diet before a colonoscopy bowel preparation is very important to ensure a successful  colon exam. It is recommended to consider certain changes to your diet three to four days prior to the procedure.  Remember that your bowels need to be empty for the exam.    What foods are good to eat?  Cut down on heavy solid foods three to four days before the procedure and start introducing lighter meals to your diet. The following food suggestions are a good part of your diet before a colonoscopy bowel preparation.  Light meat that is easily digestible such as chicken (without the skin)   Potatoes without skin   Cheese   Eggs   A light meal of steamed white fish   Light clear soups    Foods and drinks to avoid  Avoid foods that contain too much fiber. Stay clear of dark colored beverages. They can stick to the walls of the digestive tract and make it difficult to differentiate from blood. Some of these foods are:  Red meat, rice, nuts and vegetables   Milk, other milk based fluids and cream   Most fruit and puddings   Whole grain pasta   Cereals, bran and seeds   Colored beverages, especially those that are red or purple in color   Red colored Jell-O   On the day before the colonoscopy, continue to drink plenty of clear fluids. It is important   to keep yourself hydrated before the exam.  Please follow all instructions as provided for cleansing the bowel. Failure to have an adequately prepped colon may cause you to have incomplete exam with further testing required.     You are going to have an Endoscopy and here are some basic instructions:    Nothing to eat or drink after midnight EXCEPT:  PLEASE TAKE MEDICATION(S) FOR HIGH BLOOD PRESSURE, SEIZURES, HEART, AND THYROID WITH A SIP OF WATER AT LEAST 2 HOURS PRIOR TO ARRIVAL TIME.   YOU MAY ALSO TAKE ANY INHALERS YOU ARE PRESCRIBED.    You will not be able to drive for 24 hours after the procedure due to sedation. Bring a driver with you the day of the procedure.   No aspirin, ibuprofen, naproxen, fish oil or vitamin E for 5 days before procedure.   Continue  current medications.    If you are on blood thinners, clearance from the prescribing physician will be obtained before your procedure is scheduled. Increased risks may be associated with discontinuation of your blood thinner and include, but not limited to, stroke, TIA, or cardiac event.    If biopsies are taken during the procedure they will be sent to a pathologist for analysis. You will be notified by mail of the pathology results in 2-3 weeks.     Your physician may also schedule a follow up appointment with the nurse practitioner to discuss pathology, symptoms or to check if you have had any problems related to your procedure. If you prefer not to return to the office after your procedure please discuss this with your physician on the day of your procedure.       3. Clearance for discontinuing anticoagulants (PLAVIX) needed before scheduling procedure. Increased risks may be associated with discontinuation of this medication including cva, tia, cardiac event. We will contact Dr Blanchie Serve office for clearance.

## 2012-09-03 LAB — GLUCOSE TOLERANCE TEST: Glucose Accuchek: 162 MG/DL

## 2012-09-18 NOTE — Telephone Encounter (Signed)
Patient called states she is scheduled for a colonoscopy tomorrow with Dr.[facility] and she was worried that in her past she had an umbilical hernia repair and the used pig skin, she was wondering if the scope would get hung on that, I told her Dr.Bodnarch[facility] will be only looking at the inside of her colon and I do not feel that should be an issue but I will send this message to the APRN and if any further advice is needed I will have her contact the patient at [phone], I also told the patient to inform the nurse in endoscopy when she checks in in the AM. Dh cma

## 2012-09-18 NOTE — Telephone Encounter (Signed)
Will not affect colonoscopy. Not entering abdominal cavity.

## 2012-09-19 LAB — GLUCOSE TOLERANCE TEST: Glucose Accuchek: 136 MG/DL

## 2012-10-15 NOTE — Patient Instructions (Signed)
Medications:  Please remember to bring all of your medication bottles to your appointment so that we can update your medication list.     We are committed to providing you with the best care possible.     *If you receive a survey after visiting one of our offices, please take time to share your experience concerning your physician office visit. These surveys are confidential and no health information about you is shared.      We are eager to improve for you and we are counting on your feedback to help make that happen.

## 2012-10-20 NOTE — Progress Notes (Signed)
[facility], [place].  [facility]  [address], [place]  [address]  Phone: [phone]  Fax: [phone]  Office Visit:  10/15/2012    Valerie Nicholson DOB: 05/04/1930, Female, 79 y.o.     Subjective  Valerie Nicholson presents today doing okay.  There is no angina, no overt heart failure, no syncope, and no stroke-like problems.     Patient Active Problem List   Diagnosis Code   . Family history of colon cancer V16.0   . Abdominal pain, acute, generalized 789.07, 338.19   . HYPERTENSION 401.9   . Aortic stenosis 424.1   . Carotid artery stenosis 433.10   . Preop cardiovascular exam V72.81   . Barrett esophagus 530.85   . GERD (gastroesophageal reflux disease) 530.81   . History of colon polyps V12.72   . High risk for colon cancer V49.89   . Dysphagia 787.20   . Diabetes mellitus (HCC) 250.00     Past Medical History   Diagnosis Date   . Lipoma    . Colon polyps      with carpeting polyp in 2011   . Hyperthyroidism    . HH (hiatus hernia)    . Hypertension    . Barrett's esophagus    . Allergic rhinitis    . Osteoarthritis    . TIA (transient ischemic attack)    . Asthma    . Obesity    . Spinal stenosis    . Aortic stenosis 01/24/2010   . Shortness of breath    . Chest pain    . Transient cerebral ischemia    . Chronic back pain    . DVT (deep venous thrombosis) (HCC)    . Hyperlipidemia    . Hypothyroidism    . Neuropathy    . Carotid artery stenosis 06/21/2011     left   . Arthritis    . Neck pain    . Thyroid disorder    . GERD (gastroesophageal reflux disease) 01/07/2012   . High blood pressure    . Diabetes mellitus (HCC)    . Sigmoid diverticulosis    . Dysphagia    . Constipation    . Sore throat      Past Surgical History   Procedure Laterality Date   . Carpal tunnel release     . Hemorrhoid surgery  1983   . Knee arthroscopy       BILATERAL   . Cataract removal     . Colonoscopy  07-12-09     Dr Beckey Downing   . Upper gastrointestinal endoscopy  12-31-08     Dr  Beckey Downing   . Appendectomy     . Nasal septum surgery       repair   . Cholecystectomy     . Colonoscopy  11-06-05     Dr Beckey Downing   . Colonoscopy  12-17-03     Dr Beckey Downing   . Upper gastrointestinal endoscopy  12-21-03     Dr Beckey Downing   . Upper gastrointestinal endoscopy  10-30-05     Dr Beckey Downing   . Vascular surgery  03-20-12 [facility]     L carotid endarterectomy with Vascu-Guard patch repair. L cervical carotid arteriograms after endarterectomy   . Colonoscopy  01-14-09     Dr Beckey Downing   . Upper gastrointestinal endoscopy  12-31-08     Dr Beckey Downing   . Carotid endarterectomy  03-19-12  Dr Allyne Gee     Family History   Problem Relation Age of Onset   . Colon Cancer Mother    . Colon Cancer Brother    . Colon Cancer Brother    . Cancer Maternal Aunt    . Emphysema Father    . Heart Failure Father    . Colon Cancer Maternal Grandfather      History   Substance Use Topics   . Smoking status: Former Smoker -- 1.00 packs/day for 20 years   . Smokeless tobacco: Never Used      Comment: Stopped 1955   . Alcohol Use: No     Allergies: Pcn; Darvocet; Fentanyl; Meloxicam; Morphine; Pristiq; Ultracet; and Zanaflex    Outpatient Prescriptions Marked as Taking for the 10/15/12 encounter (Office Visit) with [name], MD   Medication Sig Dispense Refill   . cycloSPORINE (RESTASIS) 0.05 % ophthalmic emulsion 1 drop 2 times daily.       Marland Kitchen erythromycin [facility]) ophthalmic ointment nightly. Nightly.       Marland Kitchen albuterol (PROVENTIL HFA;VENTOLIN HFA) 108 (90 BASE) MCG/ACT inhaler Inhale 2 puffs into the lungs every 6 hours as needed for Wheezing.       . docusate sodium (COLACE) 100 MG capsule Take 100 mg by mouth 2 times daily.       Marland Kitchen olmesartan (BENICAR) 40 MG tablet Take 1 tablet by mouth daily.  30 tablet  5   . pantoprazole (PROTONIX) 40 MG tablet Take 40 mg by mouth 2 times daily.       Marland Kitchen therapeutic multivitamin-minerals (THERAGRAN-M) tablet Take 1 tablet by mouth daily.       Marland Kitchen atenolol (TENORMIN) 50 MG tablet Take 50  mg by mouth 2 times daily.       Marland Kitchen atorvastatin (LIPITOR) 80 MG tablet Take 80 mg by mouth daily.       . clopidogrel (PLAVIX) 75 MG tablet Take 75 mg by mouth daily.       Marland Kitchen glipiZIDE (GLUCOTROL) 5 MG tablet Take 5 mg by mouth 2 times daily (before meals).       . metformin (GLUCOPHAGE-XR) 500 MG XR tablet Take 500 mg by mouth daily.       . montelukast (SINGULAIR) 10 MG tablet Take 10 mg by mouth Daily.       . budesonide-formoterol (SYMBICORT) 160-4.5 MCG/ACT AERO Inhale 2 puffs into the lungs 2 times daily.         . Loratadine (CLARITIN PO) Take 5 mg by mouth 2 times daily.       . ASPIRIN PO Take 81 mg by mouth daily.       Marland Kitchen CALCIUM PO Take 600 mg by mouth 2 times daily.         BP Readings from Last 3 Encounters:   10/15/12 160/84   07/21/12 150/90   07/17/12 199/79    Pulse Readings from Last 3 Encounters:   10/15/12 64   07/21/12 60   07/17/12 57        Objective  GENERAL:  The patient is a pleasant 79 y.o. female in no apparent distress.   VITAL SIGNS: BP 160/84  Pulse 64  Ht 5' (1.524 m)  Wt 211 lb (95.709 kg)  BMI 41.21 kg/m2  HEENT:  The head is normocephalic.  NECK:  Supple without increased jugular venous pressures.       LUNGS:  The lungs are clear.      HEART:   The  heart's rhythm is regular without appreciated murmur or rub.    ABDOMEN:  Soft, nontender, nondistended.  Bowel sounds are intact.   EXTREMITIES:  There is no clubbing, cyanosis, or edema.   NEUROLOGICAL:  Cranial nerves II through XII are grossly intact.      Assessment  1.  Stable cardiovascular status    Plan  1.  Continue current medications.   2.  Return for wellness visit in six months and see me in a year.     Judith Blonder. Chase Picket, M.D., Ph.D., F.A.C.C.  [facility] [place], [place].     Cc:   Verlin Dike  [address]  [place] [address]  [phone] phone  [phone] fax

## 2012-11-25 ENCOUNTER — Encounter

## 2012-11-25 MED ORDER — OLMESARTAN MEDOXOMIL 40 MG PO TABS
40 MG | ORAL_TABLET | Freq: Every day | ORAL | Status: DC
Start: 2012-11-25 — End: 2013-05-29

## 2013-04-14 MED ORDER — HYDROCHLOROTHIAZIDE 12.5 MG PO TABS
12.5 MG | ORAL_TABLET | Freq: Every day | ORAL | Status: DC
Start: 2013-04-14 — End: 2013-10-14

## 2013-04-14 NOTE — Patient Instructions (Signed)
Low Sodium Diet (2,000 Milligram): After Your Visit  Your Care Instructions  Too much sodium causes your body to hold on to extra water. This can raise your blood pressure and force your heart and kidneys to work harder. In very serious cases, this could cause you to be put in the hospital. It might even be life-threatening. By limiting sodium, you will feel better and lower your risk of serious problems.  The most common source of sodium is salt. People get most of the salt in their diet from canned, prepared, and packaged foods. Fast food and restaurant meals also are very high in sodium. Your doctor will probably limit your sodium to less than 2,000 milligrams (mg) a day. This limit counts all the sodium in prepared and packaged foods and any salt you add to your food.  And try to further reduce how much sodium you eat to less than 1,500 mg a day if you are 51 or older, are black, or have high blood pressure, diabetes, or chronic kidney disease.  Follow-up care is a key part of your treatment and safety. Be sure to make and go to all appointments, and call your doctor if you are having problems. It's also a good idea to know your test results and keep a list of the medicines you take.  How can you care for yourself at home?  Read food labels   Read labels on cans and food packages. The labels tell you how much sodium is in each serving. Make sure that you look at the serving size. If you eat more than the serving size, you have eaten more sodium.   Food labels also tell you the Percent Daily Value for sodium. Choose products with low Percent Daily Values for sodium.   Be aware that sodium can come in forms other than salt, including monosodium glutamate (MSG), sodium citrate, and sodium bicarbonate (baking soda). MSG is often added to Asian food. When you eat out, you can sometimes ask for food without MSG or added salt.  Buy low-sodium foods   Buy foods that are labeled "unsalted" (no salt added),  "sodium-free" (less than 5 mg of sodium per serving), or "low-sodium" (less than 140 mg of sodium per serving). Foods labeled "reduced-sodium" and "light sodium" may still have too much sodium. Be sure to read the label to see how much sodium you are getting.   Buy fresh vegetables, or frozen vegetables without added sauces. Buy low-sodium versions of canned vegetables, soups, and other canned goods.  Prepare low-sodium meals   Cut back on the amount of salt you use in cooking. This will help you adjust to the taste. Do not add salt after cooking. One teaspoon of salt has about 2,300 mg of sodium.   Take the salt shaker off the table.   Flavor your food with garlic, lemon juice, onion, vinegar, herbs, and spices. Do not use soy sauce, lite soy sauce, steak sauce, onion salt, garlic salt, celery salt, mustard, or ketchup on your food.   Use low-sodium salad dressings, sauces, and ketchup. Or make your own salad dressings and sauces without adding salt.   Use less salt (or none) when recipes call for it. You can often use half the salt a recipe calls for without losing flavor. Other foods such as rice, pasta, and grains do not need added salt.   Rinse canned vegetables, and cook them in fresh water. This removes some--but not all--of the salt.   Avoid water  that is naturally high in sodium or that has been treated with water softeners, which add sodium. Call your local water company to find out the sodium content of your water supply. If you buy bottled water, read the label and choose a sodium-free brand.  Avoid high-sodium foods   Avoid eating:   Smoked, cured, salted, and canned meat, fish, and poultry.   Ham, bacon, hot dogs, and luncheon meats.   Regular, hard, and processed cheese and regular peanut butter.   Crackers with salted tops, and other salted snack foods such as pretzels, chips, and salted popcorn.   Frozen prepared meals, unless labeled low-sodium.   Canned and dried soups, broths, and  bouillon, unless labeled sodium-free or low-sodium.   Canned vegetables, unless labeled sodium-free or low-sodium.   [place] fries, pizza, tacos, and other fast foods.   Pickles, olives, ketchup, and other condiments, especially soy sauce, unless labeled sodium-free or low-sodium.   Where can you learn more?   Go to [email or web address] and sign in to your MyChart account. Enter [phone] in the Search Health Information box to learn more about "Low Sodium Diet (2,000 Milligram): After Your Visit."    If you do not have an account, please click on the "Sign Up Now" link.      2006-2014 [employer]. Care instructions adapted under license by [facility]. This care instruction is for use with your licensed healthcare professional. If you have questions about a medical condition or this instruction, always ask your healthcare professional. [employer] disclaims any warranty or liability for your use of this information.  Content Version: 10.3.368381; Current as of: July 09, 2012              Limiting Sodium and Fluids With Heart Failure: After Your Visit  Your Care Instructions  Sodium causes your body to keep extra water, making it harder for your heart to pump. By limiting sodium, you will feel better and lower your risk of having to go to the hospital.  People get most of their sodium from processed foods. Fast food and restaurant meals also tend to be very high in sodium. Your doctor may suggest that you limit sodium to 2,000 milligrams (mg) a day or less. That is less than 1 teaspoon of salt a day, including all the salt you eat in cooked or packaged foods.  Usually, you have to limit the amount of liquids you drink only if your heart failure is severe. Limiting sodium alone often is enough to help your body get rid of extra fluids. However, your doctor may tell you to limit your fluid intake to a set amount each day.  Follow-up care is a key part of your treatment and  safety. Be sure to make and go to all appointments, and call your doctor if you are having problems. It's also a good idea to know your test results and keep a list of the medicines you take.  How can you care for yourself at home?  Read food labels   Read food labels on cans and food packages. The labels tell you how much sodium is in each serving. Make sure that you look at the serving size. If you eat more than the serving size, you have eaten more sodium than is listed for one serving.   Food labels also tell you the Percent Daily Value. If the Percent Daily Value says 50%, it means that you will get at least 50% of all the sodium you  need for the entire day in one serving. Choose products with low Percent Daily Values for sodium.   Be aware that sodium can come in forms other than salt, including monosodium glutamate (MSG), sodium citrate, and sodium bicarbonate (baking soda). MSG is often added to Asian food. You can sometimes ask for food without MSG or salt.  Buy low-sodium foods   Buy foods that are labeled "unsalted" (no salt added), "sodium-free" (less than 5 mg of sodium per serving), or "low-sodium" (less than 140 mg of sodium per serving). A food labeled "light sodium" has less than half of the full-sodium version of that food. Foods labeled "reduced-sodium" may still have too much sodium.   Buy fresh vegetables or plain, frozen vegetables. Buy low-sodium versions of canned vegetables, soups, and other canned goods.  Prepare low-sodium meals   Use less salt each day when cooking. Reducing salt in this way will help you adjust to the taste. Do not add salt after cooking. Take the salt shaker off the table.   Flavor your food with garlic, lemon juice, onion, vinegar, herbs, and spices instead of salt. Do not use soy sauce, steak sauce, onion salt, garlic salt, mustard, or ketchup on your food.   Make your own salad dressings, sauces, and ketchup without adding salt.   Use less salt (or none)  when recipes call for it. You can often use half the salt a recipe calls for without losing flavor. Other dishes like rice, pasta, and grains do not need added salt.   Rinse canned vegetables. This removes some--but not all--of the salt.   Avoid water that has a naturally high sodium content or that has been treated with water softeners, which add sodium. Call your local water company to find out the sodium content of your water supply. If you buy bottled water, read the label and choose a sodium-free brand.  Avoid high-sodium foods, such as:   Smoked, cured, salted, and canned meat, fish, and poultry.   Ham, bacon, hot dogs, and luncheon meats.   Regular, hard, and processed cheese and regular peanut butter.   Crackers with salted tops.   Frozen prepared meals.   Canned and dried soups, broths, and bouillon, unless labeled sodium-free or low-sodium.   Canned vegetables, unless labeled sodium-free or low-sodium.   Salted snack foods such as chips and pretzels.   [place] fries, pizza, tacos, and other fast foods.   Pickles, olives, ketchup, and other condiments, especially soy sauce, unless labeled sodium-free or low-sodium.  If you cannot cook for yourself   Have family members or friends help you, or have someone cook low-sodium meals.   Check with your local senior nutrition program to find out where meals are served and whether they offer a low-sodium option. You can often find these programs through your local health department or hospital.   Have meals delivered to your home. Most cities have a [employer]. These programs provide one hot meal a day for older adults, delivered to their homes. Ask whether these meals are low-sodium. Let them know that you are on a low-sodium diet.  Limiting fluid intake   Find a method that works for you. You might simply write down how much you drink every time you do. Some people keep a container filled with the amount of fluid allowed for that day.  If they drink from a source other than the container, then they pour out that amount.   Measure your regular drinking glasses  to find out how much fluid each one holds. Once you know this, you will not have to measure every time.   Besides water, milk, juices, and other drinks, some foods have a lot of fluid. Count any foods that will melt (such as ice cream or gelatin dessert) or liquid foods (such as soup) as part of your fluid intake for the day.   Where can you learn more?   Go to [email or web address] and sign in to your MyChart account. Enter A166 in the Search Health Information box to learn more about "Limiting Sodium and Fluids With Heart Failure: After Your Visit."    If you do not have an account, please click on the "Sign Up Now" link.      2006-2014 [employer]. Care instructions adapted under license by [facility]. This care instruction is for use with your licensed healthcare professional. If you have questions about a medical condition or this instruction, always ask your healthcare professional. [employer] disclaims any warranty or liability for your use of this information.  Content Version: 10.3.368381; Current as of: July 09, 2012

## 2013-04-14 NOTE — Progress Notes (Signed)
[facility], [place]  [address], [place]  [address]  Phone: [phone]  Fax: [phone]    OFFICE VISIT:  04/14/2013    Valerie Nicholson - DOB: Jun 14, 1930    Reason For Visit:  Valerie Nicholson is a 79 y.o. female who is here for 6 Month Follow-Up; and Hypertension   Patient is here for 6 month follow up. Patient states she has been under alot of stress and her BP has been very high latley. The patient states that she usually has white coat syndrome but her Bp has been elevated at home also.  Patient states she is also having some SOB.   The patient denies any chest pain. The patient denies any other cardiac complaints, stroke like symptoms, syncope, or near syncope.     Subjective  The patient's PCP monitors lab for general.    Valerie Nicholson has the following history as recorded in EpicCare:    Patient Active Problem List    Diagnosis Date Noted   . Hyperlipidemia    . Diabetes mellitus (HCC) 07/07/2012   . Barrett esophagus 01/07/2012   . GERD (gastroesophageal reflux disease) 01/07/2012   . History of colon polyps 01/07/2012   . High risk for colon cancer 01/07/2012   . Dysphagia 01/07/2012   . Preop cardiovascular exam 07/04/2011   . Carotid artery stenosis 06/21/2011   . Aortic stenosis 01/24/2010   . HYPERTENSION    . Family history of colon cancer 12/16/2009   . Abdominal pain, acute, generalized 12/16/2009     Past Medical History   Diagnosis Date   . Lipoma    . Colon polyps      with carpeting polyp in 2011   . Hyperthyroidism    . HH (hiatus hernia)    . Hypertension    . Barrett's esophagus    . Allergic rhinitis    . Osteoarthritis    . TIA (transient ischemic attack)    . Asthma    . Obesity    . Spinal stenosis    . Aortic stenosis 01/24/2010   . Shortness of breath    . Chest pain    . Transient cerebral ischemia    . Chronic back pain    . DVT (deep venous thrombosis) (HCC)    . Hyperlipidemia    . Hypothyroidism    . Neuropathy    . Carotid artery stenosis  06/21/2011     left   . Arthritis    . Neck pain    . Thyroid disorder    . GERD (gastroesophageal reflux disease) 01/07/2012   . High blood pressure    . Diabetes mellitus (HCC)    . Sigmoid diverticulosis    . Dysphagia    . Constipation    . Sore throat    . Hyperlipidemia      Past Surgical History   Procedure Laterality Date   . Carpal tunnel release     . Hemorrhoid surgery  1983   . Knee arthroscopy       BILATERAL   . Cataract removal     . Colonoscopy  07-12-09     Dr Beckey Downing   . Upper gastrointestinal endoscopy  12-31-08     Dr Beckey Downing   . Appendectomy     . Nasal septum surgery       repair   . Cholecystectomy     . Colonoscopy  11-06-05  Dr Beckey Downing   . Colonoscopy  12-17-03     Dr Beckey Downing   . Upper gastrointestinal endoscopy  12-21-03     Dr Beckey Downing   . Upper gastrointestinal endoscopy  10-30-05     Dr Beckey Downing   . Vascular surgery  03-20-12 [facility]     L carotid endarterectomy with Vascu-Guard patch repair. L cervical carotid arteriograms after endarterectomy   . Colonoscopy  01-14-09     Dr Beckey Downing   . Upper gastrointestinal endoscopy  12-31-08     Dr Beckey Downing   . Carotid endarterectomy  03-19-12     Dr Allyne Gee     Family History   Problem Relation Age of Onset   . Colon Cancer Mother    . Colon Cancer Brother    . Colon Cancer Brother    . Cancer Maternal Aunt    . Emphysema Father    . Heart Failure Father    . Colon Cancer Maternal Grandfather      History   Substance Use Topics   . Smoking status: Former Smoker -- 1.00 packs/day for 20 years   . Smokeless tobacco: Never Used      Comment: Stopped 1955   . Alcohol Use: No      Current Outpatient Prescriptions   Medication Sig Dispense Refill   . Olopatadine HCl (PATANASE NA) by Nasal route.       . hydrochlorothiazide (HYDRODIURIL) 12.5 MG tablet Take 1 tablet by mouth daily.  30 tablet  3   . olmesartan (BENICAR) 40 MG tablet Take 1 tablet by mouth daily.  30 tablet  5   . cycloSPORINE (RESTASIS) 0.05 % ophthalmic emulsion 1 drop 2 times  daily.       Marland Kitchen erythromycin [facility]) ophthalmic ointment nightly. Nightly.       Marland Kitchen albuterol (PROVENTIL HFA;VENTOLIN HFA) 108 (90 BASE) MCG/ACT inhaler Inhale 2 puffs into the lungs every 6 hours as needed for Wheezing.       . docusate sodium (COLACE) 100 MG capsule Take 100 mg by mouth 2 times daily.       . pantoprazole (PROTONIX) 40 MG tablet Take 40 mg by mouth 2 times daily.       Marland Kitchen therapeutic multivitamin-minerals (THERAGRAN-M) tablet Take 1 tablet by mouth daily.       Marland Kitchen atenolol (TENORMIN) 50 MG tablet Take 50 mg by mouth 2 times daily.       Marland Kitchen atorvastatin (LIPITOR) 80 MG tablet Take 80 mg by mouth daily.       . clopidogrel (PLAVIX) 75 MG tablet Take 75 mg by mouth daily.       Marland Kitchen glipiZIDE (GLUCOTROL) 5 MG tablet Take 5 mg by mouth 2 times daily (before meals).       . metformin (GLUCOPHAGE-XR) 500 MG XR tablet Take 500 mg by mouth daily.       . montelukast (SINGULAIR) 10 MG tablet Take 10 mg by mouth Daily.       . budesonide-formoterol (SYMBICORT) 160-4.5 MCG/ACT AERO Inhale 2 puffs into the lungs 2 times daily.         . Loratadine (CLARITIN PO) Take 5 mg by mouth 2 times daily.       . ASPIRIN PO Take 81 mg by mouth daily.       Marland Kitchen CALCIUM PO Take 600 mg by mouth 2 times daily.         No current facility-administered medications for this visit.  Allergies: Pcn; Darvocet; Fentanyl; Meloxicam; Morphine; Pristiq; Ultracet; and Zanaflex    Review of Systems  Constitutional - no significant activity change, appetite change, or unexpected weight change. No fever, chills or diaphoresis.  No fatigue.   HEENT - no significant rhinorrhea or epistaxis. No tinnitus or significant hearing loss.   Eyes - no sudden vision change or amaurosis. No corneal arcus, xantholasma, subconjunctival hemorrhage or discharge.  Respiratory - no significant wheezing, stridor, apnea or cough.  No dyspnea on exertion or shortness of breath.  Cardiovascular - no exertional chest pain, orthopnea or PND.  No sensation of  sustained arrythmia or slow heart rate.   No claudication or leg edema.  Gastrointestinal - no abdominal swelling or pain. No blood in stool. No severe constipation, diarrhea, nausea, or vomiting.   Genitourinary - no dysuria, frequency, or urgency. No flank pain or hematuria.   Musculoskeletal - no back pain, gait disturbance, or myalgia.    Extremities - no clubbing, cyanosis or edema.  Skin - no color change or rash.  No pallor.  No new surgical incision.  Neurologic - no speech difficulty, facial asymmetry or lateralizing weakness.  No seizures, presyncope, syncope, or significant dizziness.  Hematologic - no easy bruising or excessive bleeding.   Psychiatric - no severe anxiety or insomnia. Normal affect.  No confusion. The patient denies any depression or suicidal ideation at this time.  All other review of systems are negative.      Objective  Vital Signs - BP 180/100  Pulse 60  Ht 4\' 11"  (1.499 m)  Wt 215 lb (97.523 kg)  BMI 43.40 kg/m2  General - Valerie Nicholson is alert, cooperative, and pleasant.  Well groomed.  No acute distress.    Body habitus - Body mass index is 43.4 kg/(m^2).  HEENT - Head is normocephalic. No circumoral cyanosis.  Dentition is normal.  EYES -   Lids normal without ptosis.  No discharge, edema or subconjunctival hemorrhage.   Neck - Symmetrical without apparent mass or lymphadenopathy.   Respiratory - Normal respiratory effort without use of accessory muscles.  Ausculatation reveals vesicular breath sounds without crackles, wheezes, rub or rhonchi.    Cardiovascular - No jugular venous distention.  Auscultation reveals regular rate and rhythm.  No audible clicks, gallop or rub.  No murmur.  No lower extremity varicosities.  No carotid bruits.  Peripheral pulses:   Abdominal -  No visible distention, mass or pulsations.  Extremities - No clubbing or cyanosis.  No statis dermatitis or ulcers. Noedema.    Musculoskeletal - Gait is even and regular without limp or shuffle.  No Osler's nodes.   No kyphosis or scoliosis.  Ambulates with walker with assistance.  Skin -  Warm and dry; no rash or pallor.   No new surgical wound.  Neurological - No focal neurological deficits.  Thought processes coherent.  No apparent tremor.   Oriented to person, place and time.    Psychiatric -  Appropriate affect and mood.     Assessment:      1. Hypertension  hydrochlorothiazide (HYDRODIURIL) 12.5 MG tablet   2. Hyperlipidemia     3. Aortic stenosis         Patient Instructions       Low Sodium Diet (2,000 Milligram): After Your Visit  Your Care Instructions  Too much sodium causes your body to hold on to extra water. This can raise your blood pressure and force your heart and kidneys to work harder. In very  serious cases, this could cause you to be put in the hospital. It might even be life-threatening. By limiting sodium, you will feel better and lower your risk of serious problems.  The most common source of sodium is salt. People get most of the salt in their diet from canned, prepared, and packaged foods. Fast food and restaurant meals also are very high in sodium. Your doctor will probably limit your sodium to less than 2,000 milligrams (mg) a day. This limit counts all the sodium in prepared and packaged foods and any salt you add to your food.  And try to further reduce how much sodium you eat to less than 1,500 mg a day if you are 51 or older, are black, or have high blood pressure, diabetes, or chronic kidney disease.  Follow-up care is a key part of your treatment and safety. Be sure to make and go to all appointments, and call your doctor if you are having problems. It's also a good idea to know your test results and keep a list of the medicines you take.  How can you care for yourself at home?  Read food labels   Read labels on cans and food packages. The labels tell you how much sodium is in each serving. Make sure that you look at the serving size. If you eat more than the serving size, you have eaten more  sodium.   Food labels also tell you the Percent Daily Value for sodium. Choose products with low Percent Daily Values for sodium.   Be aware that sodium can come in forms other than salt, including monosodium glutamate (MSG), sodium citrate, and sodium bicarbonate (baking soda). MSG is often added to Asian food. When you eat out, you can sometimes ask for food without MSG or added salt.  Buy low-sodium foods   Buy foods that are labeled "unsalted" (no salt added), "sodium-free" (less than 5 mg of sodium per serving), or "low-sodium" (less than 140 mg of sodium per serving). Foods labeled "reduced-sodium" and "light sodium" may still have too much sodium. Be sure to read the label to see how much sodium you are getting.   Buy fresh vegetables, or frozen vegetables without added sauces. Buy low-sodium versions of canned vegetables, soups, and other canned goods.  Prepare low-sodium meals   Cut back on the amount of salt you use in cooking. This will help you adjust to the taste. Do not add salt after cooking. One teaspoon of salt has about 2,300 mg of sodium.   Take the salt shaker off the table.   Flavor your food with garlic, lemon juice, onion, vinegar, herbs, and spices. Do not use soy sauce, lite soy sauce, steak sauce, onion salt, garlic salt, celery salt, mustard, or ketchup on your food.   Use low-sodium salad dressings, sauces, and ketchup. Or make your own salad dressings and sauces without adding salt.   Use less salt (or none) when recipes call for it. You can often use half the salt a recipe calls for without losing flavor. Other foods such as rice, pasta, and grains do not need added salt.   Rinse canned vegetables, and cook them in fresh water. This removes some--but not all--of the salt.   Avoid water that is naturally high in sodium or that has been treated with water softeners, which add sodium. Call your local water company to find out the sodium content of your water supply. If you buy  bottled water, read the label and  choose a sodium-free brand.  Avoid high-sodium foods   Avoid eating:   Smoked, cured, salted, and canned meat, fish, and poultry.   Ham, bacon, hot dogs, and luncheon meats.   Regular, hard, and processed cheese and regular peanut butter.   Crackers with salted tops, and other salted snack foods such as pretzels, chips, and salted popcorn.   Frozen prepared meals, unless labeled low-sodium.   Canned and dried soups, broths, and bouillon, unless labeled sodium-free or low-sodium.   Canned vegetables, unless labeled sodium-free or low-sodium.   [place] fries, pizza, tacos, and other fast foods.   Pickles, olives, ketchup, and other condiments, especially soy sauce, unless labeled sodium-free or low-sodium.   Where can you learn more?   Go to [email or web address] and sign in to your MyChart account. Enter [phone] in the Search Health Information box to learn more about "Low Sodium Diet (2,000 Milligram): After Your Visit."    If you do not have an account, please click on the "Sign Up Now" link.      2006-2014 [employer]. Care instructions adapted under license by [facility]. This care instruction is for use with your licensed healthcare professional. If you have questions about a medical condition or this instruction, always ask your healthcare professional. Health[employer], Inc[employer] disclaims any warranty or liability for your use of this information.  Content Version: 10.3.368381; Current as of: July 09, 2012              Limiting Sodium and Fluids With Heart Failure: After Your Visit  Your Care Instructions  Sodium causes your body to keep extra water, making it harder for your heart to pump. By limiting sodium, you will feel better and lower your risk of having to go to the hospital.  People get most of their sodium from processed foods. Fast food and restaurant meals also tend to be very high in sodium. Your doctor may suggest that you  limit sodium to 2,000 milligrams (mg) a day or less. That is less than 1 teaspoon of salt a day, including all the salt you eat in cooked or packaged foods.  Usually, you have to limit the amount of liquids you drink only if your heart failure is severe. Limiting sodium alone often is enough to help your body get rid of extra fluids. However, your doctor may tell you to limit your fluid intake to a set amount each day.  Follow-up care is a key part of your treatment and safety. Be sure to make and go to all appointments, and call your doctor if you are having problems. It's also a good idea to know your test results and keep a list of the medicines you take.  How can you care for yourself at home?  Read food labels   Read food labels on cans and food packages. The labels tell you how much sodium is in each serving. Make sure that you look at the serving size. If you eat more than the serving size, you have eaten more sodium than is listed for one serving.   Food labels also tell you the Percent Daily Value. If the Percent Daily Value says 50%, it means that you will get at least 50% of all the sodium you need for the entire day in one serving. Choose products with low Percent Daily Values for sodium.   Be aware that sodium can come in forms other than salt, including monosodium glutamate (MSG), sodium citrate, and sodium bicarbonate (baking soda). MSG  is often added to Asian food. You can sometimes ask for food without MSG or salt.  Buy low-sodium foods   Buy foods that are labeled "unsalted" (no salt added), "sodium-free" (less than 5 mg of sodium per serving), or "low-sodium" (less than 140 mg of sodium per serving). A food labeled "light sodium" has less than half of the full-sodium version of that food. Foods labeled "reduced-sodium" may still have too much sodium.   Buy fresh vegetables or plain, frozen vegetables. Buy low-sodium versions of canned vegetables, soups, and other canned goods.  Prepare  low-sodium meals   Use less salt each day when cooking. Reducing salt in this way will help you adjust to the taste. Do not add salt after cooking. Take the salt shaker off the table.   Flavor your food with garlic, lemon juice, onion, vinegar, herbs, and spices instead of salt. Do not use soy sauce, steak sauce, onion salt, garlic salt, mustard, or ketchup on your food.   Make your own salad dressings, sauces, and ketchup without adding salt.   Use less salt (or none) when recipes call for it. You can often use half the salt a recipe calls for without losing flavor. Other dishes like rice, pasta, and grains do not need added salt.   Rinse canned vegetables. This removes some--but not all--of the salt.   Avoid water that has a naturally high sodium content or that has been treated with water softeners, which add sodium. Call your local water company to find out the sodium content of your water supply. If you buy bottled water, read the label and choose a sodium-free brand.  Avoid high-sodium foods, such as:   Smoked, cured, salted, and canned meat, fish, and poultry.   Ham, bacon, hot dogs, and luncheon meats.   Regular, hard, and processed cheese and regular peanut butter.   Crackers with salted tops.   Frozen prepared meals.   Canned and dried soups, broths, and bouillon, unless labeled sodium-free or low-sodium.   Canned vegetables, unless labeled sodium-free or low-sodium.   Salted snack foods such as chips and pretzels.   [place] fries, pizza, tacos, and other fast foods.   Pickles, olives, ketchup, and other condiments, especially soy sauce, unless labeled sodium-free or low-sodium.  If you cannot cook for yourself   Have family members or friends help you, or have someone cook low-sodium meals.   Check with your local senior nutrition program to find out where meals are served and whether they offer a low-sodium option. You can often find these programs through your local health department or  hospital.   Have meals delivered to your home. Most cities have a [employer]. These programs provide one hot meal a day for older adults, delivered to their homes. Ask whether these meals are low-sodium. Let them know that you are on a low-sodium diet.  Limiting fluid intake   Find a method that works for you. You might simply write down how much you drink every time you do. Some people keep a container filled with the amount of fluid allowed for that day. If they drink from a source other than the container, then they pour out that amount.   Measure your regular drinking glasses to find out how much fluid each one holds. Once you know this, you will not have to measure every time.   Besides water, milk, juices, and other drinks, some foods have a lot of fluid. Count any foods that will  melt (such as ice cream or gelatin dessert) or liquid foods (such as soup) as part of your fluid intake for the day.   Where can you learn more?   Go to [email or web address] and sign in to your MyChart account. Enter A166 in the Search Health Information box to learn more about "Limiting Sodium and Fluids With Heart Failure: After Your Visit."    If you do not have an account, please click on the "Sign Up Now" link.      2006-2014 [employer], [employer]. Care instructions adapted under license by [facility]. This care instruction is for use with your licensed healthcare professional. If you have questions about a medical condition or this instruction, always ask your healthcare professional. [employer] disclaims any warranty or liability for your use of this information.  Content Version: 10.3.368381; Current as of: July 09, 2012                     Plan  Continue current medications as prescribed.   Continue heart healthy diet.   Exercise as tolerated.  Strive for 15 minutes of exercise most days of the week.    Blood pressure goal is 140/90 or less. If you are a diabetic, the  goal is 130/80 or less.   Continue to follow up with primary care provider for medical concerns.   Call with any problems, questions or concerns.   Follow up as scheduled.  Educational material provided.    Cardiac protection and prevention along with medication/education given  HTN-initiate HCTZ 12.5mg  qd  Hyperlipidemia-managed per PCP  Aortic stenosis-follow      Additional instructions: As always patient advised to bring in medication bottles in order to correctly reconcile with our current list.    For any patient on anticoagulation or antiplatelets , they are advised to given no less that 10 days notice prior surgical procedure in order withhold medication(s)    Chip Boer, APRN

## 2013-04-29 NOTE — Progress Notes (Signed)
[facility], [place]  [address], [place]  [address]  Phone: [phone]  Fax: [phone]    OFFICE VISIT:  04/29/2013    Valerie Nicholson - DOB: [birth year]    Reason For Visit:  Valerie Nicholson is a 79 y.o. female who is here for Follow-up; and Hypertension  Patient is here today for a 2 week follow up from starting HCTZ. Patient states what few readings at home she has gotten have been wnl and that the medication is working patient. She also states that she feels that some of her problem has been stress with her husband and possible dementia. The patient denies any  cardiac complaints, stroke like symptoms, syncope, or near syncope.     Subjective  The patient's PCP monitors lab for cholesterol.    Valerie Nicholson has the following history as recorded in Epic[employer]:    Patient Active Problem List    Diagnosis Date Noted   . Hyperlipidemia    . Diabetes mellitus (HCC) 07/07/2012   . Barrett esophagus 01/07/2012   . GERD (gastroesophageal reflux disease) 01/07/2012   . History of colon polyps 01/07/2012   . High risk for colon cancer 01/07/2012   . Dysphagia 01/07/2012   . Preop cardiovascular exam 07/04/2011   . Carotid artery stenosis 06/21/2011   . Aortic stenosis 01/24/2010   . HYPERTENSION    . Family history of colon cancer 12/16/2009   . Abdominal pain, acute, generalized 12/16/2009     Past Medical History   Diagnosis Date   . Lipoma    . Colon polyps      with carpeting polyp in 2011   . Hyperthyroidism    . HH (hiatus hernia)    . Hypertension    . Barrett's esophagus    . Allergic rhinitis    . Osteoarthritis    . TIA (transient ischemic attack)    . Asthma    . Obesity    . Spinal stenosis    . Aortic stenosis 01/24/2010   . Shortness of breath    . Chest pain    . Transient cerebral ischemia    . Chronic back pain    . DVT (deep venous thrombosis) (HCC)    . Hyperlipidemia    . Hypothyroidism    . Neuropathy    . Carotid artery stenosis 06/21/2011     left   . Arthritis    .  Neck pain    . Thyroid disorder    . GERD (gastroesophageal reflux disease) 01/07/2012   . High blood pressure    . Diabetes mellitus (HCC)    . Sigmoid diverticulosis    . Dysphagia    . Constipation    . Sore throat    . Hyperlipidemia    . Aortic stenosis      Past Surgical History   Procedure Laterality Date   . Carpal tunnel release     . Hemorrhoid surgery  1983   . Knee arthroscopy       BILATERAL   . Cataract removal     . Colonoscopy  07-12-09     Dr Beckey Downing   . Upper gastrointestinal endoscopy  12-31-08     Dr Beckey Downing   . Appendectomy     . Nasal septum surgery       repair   . Cholecystectomy     . Colonoscopy  11-06-05     Dr Beckey Downing   .  Colonoscopy  12-17-03     Dr Beckey Downing   . Upper gastrointestinal endoscopy  12-21-03     Dr Beckey Downing   . Upper gastrointestinal endoscopy  10-30-05     Dr Beckey Downing   . Vascular surgery  03-20-12 [facility]     L carotid endarterectomy with Vascu-Guard patch repair. L cervical carotid arteriograms after endarterectomy   . Colonoscopy  01-14-09     Dr Beckey Downing   . Upper gastrointestinal endoscopy  12-31-08     Dr Beckey Downing   . Carotid endarterectomy  03-19-12     Dr Allyne Gee     Family History   Problem Relation Age of Onset   . Colon Cancer Mother    . Colon Cancer Brother    . Colon Cancer Brother    . Cancer Maternal Aunt    . Emphysema Father    . Heart Failure Father    . Colon Cancer Maternal Grandfather      History   Substance Use Topics   . Smoking status: Former Smoker -- 1.00 packs/day for 20 years   . Smokeless tobacco: Never Used      Comment: Stopped 1955   . Alcohol Use: No      Current Outpatient Prescriptions   Medication Sig Dispense Refill   . cyclobenzaprine (FLEXERIL) 5 MG tablet Take 5 mg by mouth 3 times daily as needed for Muscle spasms.       . Olopatadine HCl (PATANASE NA) by Nasal route.       . hydrochlorothiazide (HYDRODIURIL) 12.5 MG tablet Take 1 tablet by mouth daily.  30 tablet  3   . olmesartan (BENICAR) 40 MG tablet Take 1 tablet by mouth  daily.  30 tablet  5   . cycloSPORINE (RESTASIS) 0.05 % ophthalmic emulsion 1 drop 2 times daily.       Marland Kitchen erythromycin [facility]) ophthalmic ointment nightly. Nightly.       Marland Kitchen albuterol (PROVENTIL HFA;VENTOLIN HFA) 108 (90 BASE) MCG/ACT inhaler Inhale 2 puffs into the lungs every 6 hours as needed for Wheezing.       . docusate sodium (COLACE) 100 MG capsule Take 100 mg by mouth 2 times daily.       . pantoprazole (PROTONIX) 40 MG tablet Take 40 mg by mouth 2 times daily.       Marland Kitchen therapeutic multivitamin-minerals (THERAGRAN-M) tablet Take 1 tablet by mouth daily.       Marland Kitchen atenolol (TENORMIN) 50 MG tablet Take 50 mg by mouth 2 times daily.       Marland Kitchen atorvastatin (LIPITOR) 80 MG tablet Take 80 mg by mouth daily.       . clopidogrel (PLAVIX) 75 MG tablet Take 75 mg by mouth daily.       Marland Kitchen glipiZIDE (GLUCOTROL) 5 MG tablet Take 5 mg by mouth 2 times daily (before meals).       . metformin (GLUCOPHAGE-XR) 500 MG XR tablet Take 500 mg by mouth daily.       . montelukast (SINGULAIR) 10 MG tablet Take 10 mg by mouth Daily.       . budesonide-formoterol (SYMBICORT) 160-4.5 MCG/ACT AERO Inhale 2 puffs into the lungs 2 times daily.         . Loratadine (CLARITIN PO) Take 5 mg by mouth 2 times daily.       . ASPIRIN PO Take 81 mg by mouth daily.       Marland Kitchen CALCIUM PO Take 600 mg by mouth 2 times  daily.         No current facility-administered medications for this visit.     Allergies: Pcn; Darvocet; Fentanyl; Meloxicam; Morphine; Pristiq; Ultracet; and Zanaflex    Review of Systems  Constitutional - no significant activity change, appetite change, or unexpected weight change. No fever, chills or diaphoresis.  No fatigue.   HEENT - no significant rhinorrhea or epistaxis. No tinnitus or significant hearing loss.   Eyes - no sudden vision change or amaurosis. No corneal arcus, xantholasma, subconjunctival hemorrhage or discharge.  Respiratory - no significant wheezing, stridor, apnea or cough.  No dyspnea on exertion or shortness of  breath.  Cardiovascular - no exertional chest pain, orthopnea or PND.  No sensation of sustained arrythmia or slow heart rate.   No claudication or leg edema.  Gastrointestinal - no abdominal swelling or pain. No blood in stool. No severe constipation, diarrhea, nausea, or vomiting.   Genitourinary - no dysuria, frequency, or urgency. No flank pain or hematuria.   Musculoskeletal - no back pain, gait disturbance, or myalgia.    Extremities - no clubbing, cyanosis or edema.  Skin - no color change or rash.  No pallor.  No new surgical incision.  Neurologic - no speech difficulty, facial asymmetry or lateralizing weakness.  No seizures, presyncope, syncope, or significant dizziness.  Hematologic - no easy bruising or excessive bleeding.   Psychiatric - no severe anxiety or insomnia. Normal affect.  No confusion. The patient denies any depression or suicidal ideation at this time.  All other review of systems are negative.      Objective  Vital Signs - BP 142/84  Pulse 60  Ht 4\' 11"  (1.499 m)  Wt 215 lb (97.523 kg)  BMI 43.40 kg/m2  General - Valerie Nicholson is alert, cooperative, and pleasant.  Well groomed.  No acute distress.    Body habitus - Body mass index is 43.4 kg/(m^2).  HEENT - Head is normocephalic. No circumoral cyanosis.  Dentition is normal.  EYES -   Lids normal without ptosis.  No discharge, edema or subconjunctival hemorrhage.   Neck - Symmetrical without apparent mass or lymphadenopathy.   Respiratory - Normal respiratory effort without use of accessory muscles.  Ausculatation reveals vesicular breath sounds without crackles, wheezes, rub or rhonchi.    Cardiovascular - No jugular venous distention.  Auscultation reveals regular rate and rhythm.  No audible clicks, gallop or rub.  No murmur.  No lower extremity varicosities.  No carotid bruits.  Peripheral pulses:   Abdominal -  No visible distention, mass or pulsations.  Extremities - No clubbing or cyanosis.  No statis dermatitis or ulcers. Noedema.     Musculoskeletal - Gait is even and regular without limp or shuffle.  No Osler's nodes.  No kyphosis or scoliosis.  Ambulates without assistance.  Skin -  Warm and dry; no rash or pallor.   No new surgical wound.  Neurological - No focal neurological deficits.  Thought processes coherent.  No apparent tremor.   Oriented to person, place and time.    Psychiatric -  Appropriate affect and mood.     Assessment:     Stable cardiovascular status. No evidence of overt heart failure, angina or dysrhythmia  1. Essential hypertension    2. Hyperlipidemia    3. Aortic stenosis        There are no Patient Instructions on file for this visit.     Plan  Continue current medications as prescribed.   Continue heart healthy  diet.   Exercise as tolerated.  Strive for 15 minutes of exercise most days of the week.    Blood pressure goal is 140/90 or less. If you are a diabetic, the goal is 130/80 or less.   Continue to follow up with primary care provider for medical concerns.   Call with any problems, questions or concerns.   Follow up as scheduled.  Educational material provided.    Cardiac protection and prevention along with medication/education given  HTN-stable CPT HCTZ  Hyperlipidemia-managed per PCP  Aortic stenosis-follow      Additional instructions: As always patient advised to bring in medication bottles in order to correctly reconcile with our current list.    For any patient on anticoagulation or antiplatelets , they are advised to given no less that 10 days notice prior surgical procedure in order withhold medication(s)    Chip Boer, APRN

## 2013-04-29 NOTE — Patient Instructions (Signed)
DASH Diet: After Your Visit  Your Care Instructions  The DASH diet is an eating plan that can help lower your blood pressure. DASH stands for Dietary Approaches to Stop Hypertension. Hypertension is high blood pressure.  The DASH diet focuses on eating foods that are high in calcium, potassium, and magnesium. These nutrients can lower blood pressure. The foods that are highest in these nutrients are fruits, vegetables, low-fat dairy products, nuts, seeds, and legumes. But taking calcium, potassium, and magnesium supplements instead of eating foods that are high in those nutrients does not have the same effect. The DASH diet also includes whole grains, fish, and poultry.  The DASH diet is one of several lifestyle changes your doctor may recommend to lower your high blood pressure. Your doctor may also want you to decrease the amount of sodium in your diet. Lowering sodium while following the DASH diet can lower blood pressure even further than just the DASH diet alone.  Follow-up care is a key part of your treatment and safety. Be sure to make and go to all appointments, and call your doctor if you are having problems. It's also a good idea to know your test results and keep a list of the medicines you take.  How can you care for yourself at home?  Following the DASH diet   Eat 4 to 5 servings of fruit each day. A serving is 1 medium-sized piece of fruit,  cup chopped or canned fruit, 1/4 cup dried fruit, or 4 ounces ( cup) of fruit juice. Choose fruit more often than fruit juice.   Eat 4 to 5 servings of vegetables each day. A serving is 1 cup of lettuce or raw leafy vegetables,  cup of chopped or cooked vegetables, or 4 ounces ( cup) of vegetable juice. Choose vegetables more often than vegetable juice.   Get 2 to 3 servings of low-fat and fat-free dairy each day. A serving is 8 ounces of milk, 1 cup of yogurt, or 1  ounces of cheese.   Eat 6 to 8 servings of grains each day. A serving is 1 slice of  bread, 1 ounce of dry cereal, or  cup of cooked rice, pasta, or cooked cereal. Try to choose whole-grain products as much as possible.   Limit lean meat, poultry, and fish to 2 servings each day. A serving is 3 ounces, about the size of a deck of cards.   Eat 4 to 5 servings of nuts, seeds, and legumes (cooked dried beans, lentils, and split peas) each week. A serving is 1/3 cup of nuts, 2 tablespoons of seeds, or  cup cooked dried beans or peas.   Limit fats and oils to 2 to 3 servings each day. A serving is 1 teaspoon of vegetable oil or 2 tablespoons of salad dressing.   Limit sweets and added sugars to 5 servings or less a week. A serving is 1 tablespoon jelly or jam,  cup sorbet, or 1 cup of lemonade.   Eat less than 2,300 milligrams (mg) of sodium a day. If you have high blood pressure, diabetes, or chronic kidney disease, if you are African-American, or if you are older than age 17, try to limit the amount of sodium you eat to less than 1,500 mg a day.  Tips for success   Start small. Do not try to make dramatic changes to your diet all at once. You might feel that you are missing out on your favorite foods and then be more  likely to not follow the plan. Make small changes, and stick with them. Once those changes become habit, add a few more changes.   Try some of the following:   Make it a goal to eat a fruit or vegetable at every meal and at snacks. This will make it easy to get the recommended amount of fruits and vegetables each day.   Try yogurt topped with fruit and nuts for a snack or healthy dessert.   Add lettuce, tomato, cucumber, and onion to sandwiches.   Combine a ready-made pizza crust with low-fat mozzarella cheese and lots of vegetable toppings. Try using tomatoes, squash, spinach, broccoli, carrots, cauliflower, and onions.   Have a variety of cut-up vegetables with a low-fat dip as an appetizer instead of chips and dip.   Sprinkle sunflower seeds or chopped almonds over  salads. Or try adding chopped walnuts or almonds to cooked vegetables.   Try some vegetarian meals using beans and peas. Add garbanzo or kidney beans to salads. Make burritos and tacos with mashed pinto beans or black beans.   Where can you learn more?   Go to [email or web address] and sign in to your MyChart account. Enter [phone] in the Search Health Information box to learn more about "DASH Diet: After Your Visit."    If you do not have an account, please click on the "Sign Up Now" link.      2006-2014 [employer]. Care instructions adapted under license by [facility]. This care instruction is for use with your licensed healthcare professional. If you have questions about a medical condition or this instruction, always ask your healthcare professional. [employer] disclaims any warranty or liability for your use of this information.  Content Version: 10.3.368381; Current as of: July 09, 2012              High Cholesterol: After Your Visit  Your Care Instructions  Cholesterol is a type of fat in your blood. It is needed for many body functions, such as making new cells. Cholesterol is made by your body. It also comes from food you eat. High cholesterol means that you have too much of the fat in your blood. This raises your risk of a heart attack and stroke.  LDL and HDL are part of your total cholesterol. LDL is the "bad" cholesterol. High LDL can raise your risk for heart disease, heart attack, and stroke. HDL is the "good" cholesterol. It helps clear bad cholesterol from the body. High HDL is linked with a lower risk of heart disease, heart attack, and stroke.  Your cholesterol levels help your doctor find out your risk for having a heart attack or stroke. You and your doctor can talk about whether you need to lower your risk and what treatment is best for you.  A heart-healthy lifestyle along with medicines can help lower your cholesterol and your risk. The way you  choose to lower your risk will depend on how high your risk is for heart attack and stroke. It will also depend on how you feel about taking medicines.  Follow-up care is a key part of your treatment and safety. Be sure to make and go to all appointments, and call your doctor if you are having problems. It's also a good idea to know your test results and keep a list of the medicines you take.  How can you care for yourself at home?   Eat a variety of foods every day. Good choices include fruits, vegetables,  whole grains (like oatmeal), dried beans and peas, nuts and seeds, soy products (like tofu), and fat-free or low-fat dairy products.   Replace butter, margarine, and hydrogenated or partially hydrogenated oils with olive and canola oils. (Canola oil margarine without trans fat is fine.)   Replace red meat with fish, poultry, and soy protein (like tofu).   Limit processed and packaged foods like chips, crackers, and cookies.   Bake, broil, or steam foods. Don't fry them.   Limit foods high in cholesterol. These include egg yolks.   Be physically active. Get at least 30 minutes of exercise on most days of the week. Walking is a good choice. You also may want to do other activities, such as running, swimming, cycling, or playing tennis or team sports.   Stay at a healthy weight or lose weight by making the changes in eating and physical activity listed above. Losing just a small amount of weight, even 5 to 10 pounds, can reduce your risk for having a heart attack or stroke.   Do not smoke.  When should you call for help?  Watch closely for changes in your health, and be sure to contact your doctor if:   You need help making lifestyle changes.   You have questions about your medicine.   Where can you learn more?   Go to [email or web address] and sign in to your MyChart account. Enter [phone] in the Search Health Information box to learn more about "High Cholesterol: After Your Visit."    If  you do not have an account, please click on the "Sign Up Now" link.      2006-2014 [employer]. Care instructions adapted under license by [facility]. This care instruction is for use with your licensed healthcare professional. If you have questions about a medical condition or this instruction, always ask your healthcare professional. [employer] disclaims any warranty or liability for your use of this information.  Content Version: 10.3.368381; Current as of: July 09, 2012              High Blood Pressure: After Your Visit  Your Care Instructions  If your blood pressure is usually above 140/90, you have high blood pressure, or hypertension. Despite what a lot of people think, high blood pressure usually doesn't cause headaches or make you feel dizzy or lightheaded. It usually has no symptoms. But it does increase your risk for heart attack, stroke, and kidney or eye damage. The higher your blood pressure, the more your risk increases.  Your doctor will give you a goal for your blood pressure. Your goal will be based on your health and your age. An example of a goal is to keep your blood pressure below 140/90.  Lifestyle changes, such as eating healthy and being active, are always important to help lower blood pressure. You might also take medicine to reach your blood pressure goal.  Follow-up care is a key part of your treatment and safety. Be sure to make and go to all appointments, and call your doctor if you are having problems. It's also a good idea to know your test results and keep a list of the medicines you take.  How can you care for yourself at home?  Medical treatment   If you stop taking your medicine, your blood pressure will go back up. You may take one or more types of medicine to lower your blood pressure. Be safe with medicines. Take your medicine exactly as prescribed. Call your doctor  if you think you are having a problem with your medicine.   Your doctor may  suggest that you take one low-dose aspirin (81 mg) a day. This can help reduce your risk of having a stroke or heart attack.   See your doctor regularly. You may need to see the doctor more often at first or until your blood pressure comes down.   If you are taking blood pressure medicine, talk to your doctor before you take decongestants or anti-inflammatory medicine, such as ibuprofen. Some of these medicines can raise blood pressure.   Learn how to check your blood pressure at home.  Lifestyle changes   Stay at a healthy weight. This is especially important if you put on weight around the waist. Losing even 10 pounds can help you lower your blood pressure.   If your doctor recommends it, get more exercise. Walking is a good choice. Bit by bit, increase the amount you walk every day. Try for at least 30 minutes on most days of the week. You also may want to swim, bike, or do other activities.   Avoid or limit alcohol. Talk to your doctor about whether you can drink any alcohol.   Try to limit how much sodium you eat to less than 2,300 milligrams (mg) a day. Your doctor may ask you to try to eat less than 1,500 mg a day.   Eat plenty of fruits (such as bananas and oranges), vegetables, legumes, whole grains, and low-fat dairy products.   Lower the amount of saturated fat in your diet. Saturated fat is found in animal products such as milk, cheese, and meat. Limiting these foods may help you lose weight and also lower your risk for heart disease.   Do not smoke. Smoking increases your risk for heart attack and stroke. If you need help quitting, talk to your doctor about stop-smoking programs and medicines. These can increase your chances of quitting for good.  When should you call for help?  Call your doctor now or seek immediate medical care if:   Your blood pressure is much higher than normal (such as 180/110 or higher).   You think high blood pressure is causing symptoms such as:   Severe  headache.   Blurry vision.  Watch closely for changes in your health, and be sure to contact your doctor if:   You do not get better as expected.   Where can you learn more?   Go to [email or web address] and sign in to your MyChart account. Enter [phone] in the Search Health Information box to learn more about "High Blood Pressure: After Your Visit."    If you do not have an account, please click on the "Sign Up Now" link.      2006-2014 [employer]. Care instructions adapted under license by [facility]. This care instruction is for use with your licensed healthcare professional. If you have questions about a medical condition or this instruction, always ask your healthcare professional. [employer] disclaims any warranty or liability for your use of this information.  Content Version: 10.3.368381; Current as of: July 09, 2012              Low Sodium Diet (2,000 Milligram): After Your Visit  Your Care Instructions  Too much sodium causes your body to hold on to extra water. This can raise your blood pressure and force your heart and kidneys to work harder. In very serious cases, this could cause you to be put in the  hospital. It might even be life-threatening. By limiting sodium, you will feel better and lower your risk of serious problems.  The most common source of sodium is salt. People get most of the salt in their diet from canned, prepared, and packaged foods. Fast food and restaurant meals also are very high in sodium. Your doctor will probably limit your sodium to less than 2,000 milligrams (mg) a day. This limit counts all the sodium in prepared and packaged foods and any salt you add to your food.  And try to further reduce how much sodium you eat to less than 1,500 mg a day if you are 51 or older, are black, or have high blood pressure, diabetes, or chronic kidney disease.  Follow-up care is a key part of your treatment and safety. Be sure to make and go to  all appointments, and call your doctor if you are having problems. It's also a good idea to know your test results and keep a list of the medicines you take.  How can you care for yourself at home?  Read food labels   Read labels on cans and food packages. The labels tell you how much sodium is in each serving. Make sure that you look at the serving size. If you eat more than the serving size, you have eaten more sodium.   Food labels also tell you the Percent Daily Value for sodium. Choose products with low Percent Daily Values for sodium.   Be aware that sodium can come in forms other than salt, including monosodium glutamate (MSG), sodium citrate, and sodium bicarbonate (baking soda). MSG is often added to Asian food. When you eat out, you can sometimes ask for food without MSG or added salt.  Buy low-sodium foods   Buy foods that are labeled "unsalted" (no salt added), "sodium-free" (less than 5 mg of sodium per serving), or "low-sodium" (less than 140 mg of sodium per serving). Foods labeled "reduced-sodium" and "light sodium" may still have too much sodium. Be sure to read the label to see how much sodium you are getting.   Buy fresh vegetables, or frozen vegetables without added sauces. Buy low-sodium versions of canned vegetables, soups, and other canned goods.  Prepare low-sodium meals   Cut back on the amount of salt you use in cooking. This will help you adjust to the taste. Do not add salt after cooking. One teaspoon of salt has about 2,300 mg of sodium.   Take the salt shaker off the table.   Flavor your food with garlic, lemon juice, onion, vinegar, herbs, and spices. Do not use soy sauce, lite soy sauce, steak sauce, onion salt, garlic salt, celery salt, mustard, or ketchup on your food.   Use low-sodium salad dressings, sauces, and ketchup. Or make your own salad dressings and sauces without adding salt.   Use less salt (or none) when recipes call for it. You can often use half the salt a  recipe calls for without losing flavor. Other foods such as rice, pasta, and grains do not need added salt.   Rinse canned vegetables, and cook them in fresh water. This removes some--but not all--of the salt.   Avoid water that is naturally high in sodium or that has been treated with water softeners, which add sodium. Call your local water company to find out the sodium content of your water supply. If you buy bottled water, read the label and choose a sodium-free brand.  Avoid high-sodium foods   Avoid  eating:   Smoked, cured, salted, and canned meat, fish, and poultry.   Ham, bacon, hot dogs, and luncheon meats.   Regular, hard, and processed cheese and regular peanut butter.   Crackers with salted tops, and other salted snack foods such as pretzels, chips, and salted popcorn.   Frozen prepared meals, unless labeled low-sodium.   Canned and dried soups, broths, and bouillon, unless labeled sodium-free or low-sodium.   Canned vegetables, unless labeled sodium-free or low-sodium.   [place] fries, pizza, tacos, and other fast foods.   Pickles, olives, ketchup, and other condiments, especially soy sauce, unless labeled sodium-free or low-sodium.   Where can you learn more?   Go to [email or web address] and sign in to your MyChart account. Enter [phone] in the Search Health Information box to learn more about "Low Sodium Diet (2,000 Milligram): After Your Visit."    If you do not have an account, please click on the "Sign Up Now" link.      2006-2014 [employer]. Care instructions adapted under license by [facility]. This care instruction is for use with your licensed healthcare professional. If you have questions about a medical condition or this instruction, always ask your healthcare professional. [employer] disclaims any warranty or liability for your use of this information.  Content Version: 10.3.368381; Current as of: July 09, 2012

## 2013-05-29 ENCOUNTER — Encounter

## 2013-05-29 MED ORDER — OLMESARTAN MEDOXOMIL 40 MG PO TABS
40 MG | ORAL_TABLET | Freq: Every day | ORAL | Status: DC
Start: 2013-05-29 — End: 2013-07-15

## 2013-07-13 NOTE — Telephone Encounter (Signed)
Can change from Benicar 40 mg (1) daily to Losartan 100 mg (1) daily.  Aloha Gell, APRN

## 2013-07-13 NOTE — Telephone Encounter (Signed)
Patient is one Benicar and it is too expensive.  The insurance company is wanting to know if the patient can take Losartan? Patient has never tried this medication.  Please advise.

## 2013-07-15 ENCOUNTER — Encounter

## 2013-07-15 MED ORDER — LOSARTAN POTASSIUM 100 MG PO TABS
100 MG | ORAL_TABLET | Freq: Every day | ORAL | Status: DC
Start: 2013-07-15 — End: 2014-01-21

## 2013-07-15 NOTE — Telephone Encounter (Signed)
Called and spoke to patient, understanding voiced on medication change.  Med list updated, refill sent to pharmacy.

## 2013-07-20 NOTE — Progress Notes (Signed)
Patient Care Team:  Verlin Dike as PCP - General (Internal Medicine)  Kermit Balo (Gastroenterology)  Jola Babinski  Joneen Boers, MD (Cardiology)      Subjective    She presents for follow-up of carotid artery stenosis. She has prior history of carotid occlusive disease for 1 - 5 years. Her current treatment includes ASA EC daily.  She denies a history of CVA.  She reports no TIA's, episodes of amaurosis fugax and episodes of lateralizing weakness.    Valerie Nicholson is a 80 y.o. female with the following history reviewed and recorded in Epic[facility]:  Patient Active Problem List    Diagnosis Date Noted   . Hyperlipidemia    . Diabetes mellitus (HCC) 07/07/2012   . Barrett esophagus 01/07/2012   . GERD (gastroesophageal reflux disease) 01/07/2012   . History of colon polyps 01/07/2012   . High risk for colon cancer 01/07/2012   . Dysphagia 01/07/2012   . Preop cardiovascular exam 07/04/2011   . Carotid artery stenosis 06/21/2011   . Aortic stenosis 01/24/2010   . HYPERTENSION    . Family history of colon cancer 12/16/2009     PT HAS STRONG FAMILY HISTORY OF CLN CA (MOM, GRANDFATHER, 2 BROTHERS, COUSINS SON)     . Abdominal pain, acute, generalized 12/16/2009     Current Outpatient Prescriptions   Medication Sig Dispense Refill   . losartan (COZAAR) 100 MG tablet Take 1 tablet by mouth daily.  30 tablet  5   . cyclobenzaprine (FLEXERIL) 5 MG tablet Take 5 mg by mouth 3 times daily as needed for Muscle spasms.       . Olopatadine HCl (PATANASE NA) by Nasal route.       . hydrochlorothiazide (HYDRODIURIL) 12.5 MG tablet Take 1 tablet by mouth daily.  30 tablet  3   . cycloSPORINE (RESTASIS) 0.05 % ophthalmic emulsion 1 drop 2 times daily.       Marland Kitchen erythromycin [facility]) ophthalmic ointment nightly. Nightly.       Marland Kitchen albuterol (PROVENTIL HFA;VENTOLIN HFA) 108 (90 BASE) MCG/ACT inhaler Inhale 2 puffs into the lungs every 6 hours as needed for Wheezing.       . docusate sodium (COLACE) 100 MG  capsule Take 100 mg by mouth 2 times daily.       . pantoprazole (PROTONIX) 40 MG tablet Take 40 mg by mouth 2 times daily.       Marland Kitchen therapeutic multivitamin-minerals (THERAGRAN-M) tablet Take 1 tablet by mouth daily.       Marland Kitchen atenolol (TENORMIN) 50 MG tablet Take 50 mg by mouth 2 times daily.       Marland Kitchen atorvastatin (LIPITOR) 80 MG tablet Take 80 mg by mouth daily.       . clopidogrel (PLAVIX) 75 MG tablet Take 75 mg by mouth daily.       Marland Kitchen glipiZIDE (GLUCOTROL) 5 MG tablet Take 5 mg by mouth 2 times daily (before meals).       . metformin (GLUCOPHAGE-XR) 500 MG XR tablet Take 500 mg by mouth daily.       . montelukast (SINGULAIR) 10 MG tablet Take 10 mg by mouth Daily.       . budesonide-formoterol (SYMBICORT) 160-4.5 MCG/ACT AERO Inhale 2 puffs into the lungs 2 times daily.         . Loratadine (CLARITIN PO) Take 5 mg by mouth 2 times daily.       . ASPIRIN PO Take 81 mg  by mouth daily.       Marland Kitchen CALCIUM PO Take 600 mg by mouth 2 times daily.         No current facility-administered medications for this visit.     Current Outpatient Prescriptions on File Prior to Visit   Medication Sig Dispense Refill   . losartan (COZAAR) 100 MG tablet Take 1 tablet by mouth daily.  30 tablet  5   . cyclobenzaprine (FLEXERIL) 5 MG tablet Take 5 mg by mouth 3 times daily as needed for Muscle spasms.       . Olopatadine HCl (PATANASE NA) by Nasal route.       . hydrochlorothiazide (HYDRODIURIL) 12.5 MG tablet Take 1 tablet by mouth daily.  30 tablet  3   . cycloSPORINE (RESTASIS) 0.05 % ophthalmic emulsion 1 drop 2 times daily.       Marland Kitchen erythromycin [facility]) ophthalmic ointment nightly. Nightly.       Marland Kitchen albuterol (PROVENTIL HFA;VENTOLIN HFA) 108 (90 BASE) MCG/ACT inhaler Inhale 2 puffs into the lungs every 6 hours as needed for Wheezing.       . docusate sodium (COLACE) 100 MG capsule Take 100 mg by mouth 2 times daily.       . pantoprazole (PROTONIX) 40 MG tablet Take 40 mg by mouth 2 times daily.       Marland Kitchen therapeutic  multivitamin-minerals (THERAGRAN-M) tablet Take 1 tablet by mouth daily.       Marland Kitchen atenolol (TENORMIN) 50 MG tablet Take 50 mg by mouth 2 times daily.       Marland Kitchen atorvastatin (LIPITOR) 80 MG tablet Take 80 mg by mouth daily.       . clopidogrel (PLAVIX) 75 MG tablet Take 75 mg by mouth daily.       Marland Kitchen glipiZIDE (GLUCOTROL) 5 MG tablet Take 5 mg by mouth 2 times daily (before meals).       . metformin (GLUCOPHAGE-XR) 500 MG XR tablet Take 500 mg by mouth daily.       . montelukast (SINGULAIR) 10 MG tablet Take 10 mg by mouth Daily.       . budesonide-formoterol (SYMBICORT) 160-4.5 MCG/ACT AERO Inhale 2 puffs into the lungs 2 times daily.         . Loratadine (CLARITIN PO) Take 5 mg by mouth 2 times daily.       . ASPIRIN PO Take 81 mg by mouth daily.       Marland Kitchen CALCIUM PO Take 600 mg by mouth 2 times daily.         No current facility-administered medications on file prior to visit.     Allergies: Pcn; Septra; Darvocet; Fentanyl; Meloxicam; Morphine; Pristiq; Ultracet; and Zanaflex  Past Medical History   Diagnosis Date   . Lipoma    . Colon polyps      with carpeting polyp in 2011   . Hyperthyroidism    . HH (hiatus hernia)    . Hypertension    . Barrett's esophagus    . Allergic rhinitis    . Osteoarthritis    . TIA (transient ischemic attack)    . Asthma    . Obesity    . Spinal stenosis    . Aortic stenosis 01/24/2010   . Shortness of breath    . Chest pain    . Transient cerebral ischemia    . Chronic back pain    . DVT (deep venous thrombosis) (HCC)    . Hyperlipidemia    .  Hypothyroidism    . Neuropathy    . Carotid artery stenosis 06/21/2011     left   . Arthritis    . Neck pain    . Thyroid disorder    . GERD (gastroesophageal reflux disease) 01/07/2012   . High blood pressure    . Diabetes mellitus (HCC)    . Sigmoid diverticulosis    . Dysphagia    . Constipation    . Sore throat    . Hyperlipidemia    . Aortic stenosis        Past Surgical History   Procedure Laterality Date   . Carpal tunnel release     . Hemorrhoid  surgery  1983   . Knee arthroscopy       BILATERAL   . Cataract removal     . Colonoscopy  07-12-09     Dr Beckey Downing   . Upper gastrointestinal endoscopy  12-31-08     Dr Beckey Downing   . Appendectomy     . Nasal septum surgery       repair   . Cholecystectomy     . Colonoscopy  11-06-05     Dr Beckey Downing   . Colonoscopy  12-17-03     Dr Beckey Downing   . Upper gastrointestinal endoscopy  12-21-03     Dr Beckey Downing   . Upper gastrointestinal endoscopy  10-30-05     Dr Beckey Downing   . Vascular surgery  03-20-12 [facility]     L carotid endarterectomy with Vascu-Guard patch repair. L cervical carotid arteriograms after endarterectomy   . Colonoscopy  01-14-09     Dr Beckey Downing   . Upper gastrointestinal endoscopy  12-31-08     Dr Beckey Downing   . Carotid endarterectomy  03-19-12     Dr Allyne Gee     Family History   Problem Relation Age of Onset   . Colon Cancer Mother    . Colon Cancer Brother    . Colon Cancer Brother    . Cancer Maternal Aunt    . Emphysema Father    . Heart Failure Father    . Colon Cancer Maternal Grandfather      History   Substance Use Topics   . Smoking status: Former Smoker -- 1.00 packs/day for 20 years   . Smokeless tobacco: Never Used      Comment: Stopped 1955   . Alcohol Use: No           Review of Systems    Constitutional - no significant activity change, appetite change, or unexpected weight change.  No fever or chills.  No diaphoresis or significant fatigue.  HENT - no significant rhinorrhea or epistaxis.  No tinnitus or significant hearing loss.  Eyes - no sudden vision change or amaurosis.  Respiratory - no significant shortness of breath, wheezing, or stridor.  No apnea, cough, or chest tightness associated with shortness of breath.  Cardiovascular - no chest pain, syncope, or significant dizziness.  No palpitations or significant leg swelling.  No claudication.  Gastrointestinal - no abdominal swelling or pain.  No blood in stool.  No severe constipation, diarrhea, nausea, or vomiting.  Genitourinary - No  difficulty urinating, dysuria, frequency, or urgency.  No flank pain or hematuria.  Musculoskeletal - no back pain, gait disturbance, or myalgia.  Skin - no color change, rash, pallor, or new wound.  Neurologic - no dizziness, facial asymmetry, or light headedness.  No seizures.  No speech difficulty or lateralizing weakness.  Hematologic -  no easy bruising or excessive bleeding.  Psychiatric - no severe anxiety or nervousness.  No confusion.  All other review of systems are negative.    Physical Exam    BP 182/81   Pulse 67   Temp(Src) 96.9 F (36.1 C)   Resp 18       Constitutional - well developed, well nourished.  No diaphoresis or acute distress.  HENT - head normocephalic.  Right external ear canal appears normal.  Left external ear canal appears normal.  Septum appears midline.  Eyes - conjunctiva normal.  EOM'S normal.  No exudate.  No icterus.  Neck- ROM appears normal, no tracheal deviation.  Cardiovascular - Regular rate and rhythm.  Heart sounds are normal.  No murmur, rub, or gallop.  Carotid pulses are 2+ to palpation bilaterally without bruit.   Pulmonary - effort appears normal.  No respiratory distress.    Lungs - Breath sounds normal. No wheezes or rales.    Extremities - Radial and brachial pulses are 2+ to palpation bilaterally.    Neurologic - alert and oriented X 3.  Physiologic. Tongue midline.  Face symmetric.  Skin - warm, dry, and intact.  No rash, erythema, or pallor.  Psychiatric - mood, affect, and behavior appear normal.  Judgment and thought processes appear normal.    Risk factors for atherosclerosis of all vascular beds have been reviewed with the patient including:  Family history, tobacco abuse in all forms, elevated cholesterol, hyperlipidemia, and diabetes.      Doppler results:    Right CCA/ICA <50% stenotic  Left CCA/ICA widely patent  Right vertebral artery flow is antegrade  Left vertebral artery flow is antegrade  Individual velocities reviewed: Yes.  Test results were  reviewed with the patient.    Options have been discussed with the patient including continued medical management.  Patient has opted to proceed with continued medical management.    Assessment    1. Carotid artery stenosis          Plan    Start/Continue ASA EC 81 mg daily  Plavix 75 mg po qd  Follow up in  12  Month(s)  Strongly encourage start/continue statin therapy  Recommend no smoking  Patient instructed to call or proceed to the emergency room with any symptoms of lateralizing weakness, loss of vision in one eye, or episodes slurred speech.

## 2013-08-05 ENCOUNTER — Encounter

## 2013-08-06 MED ORDER — HYDROCHLOROTHIAZIDE 12.5 MG PO CAPS
12.5 MG | ORAL_CAPSULE | Freq: Every morning | ORAL | Status: DC
Start: 2013-08-06 — End: 2014-03-11

## 2013-10-14 NOTE — Patient Instructions (Signed)
Please remember to bring all of your medications bottles to your appointment so that we can update your medication list.  Include all medications you take (including prescriptions, over-the-counter, vitamins, and herbals).  We ask that you bring your original medication bottles, as it is difficult to identify medications based on color and shape alone.      We are committed to providing you with the best care possible.  If you receive a survey after visiting one of our offices, please take time to share your experience concerning your physician office visit. These surveys are confidential and no health information about you is shared. We are eager to improve for you and we are counting on your feedback to help make that happen.     Allergies:  Pcn; Septra; Darvocet; Fentanyl; Meloxicam; Morphine; Pristiq; Ultracet; and Zanaflex

## 2013-10-19 NOTE — Progress Notes (Signed)
[facility], [place].  Valerie Nicholson  [address], [address], [place]  [address]  Phone: [phone]  Fax: [phone]  Office Visit:  10/14/2013    Valerie Nicholson DOB: Feb 03, 1931, Female, 80 y.o.     Chief Complaint   Patient presents with   . 1 Year Follow Up     pt states that she only have chest pains in one area around her heart. She also states that she has been worried about her BP because it has been running high. Dr Loura Back manages lipids. No rfs requested.    . Chest Pain   . Hypertension       HPI:  Valerie Nicholson presents today having a lot of stress.  It would appear that Valerie Nicholson is developing some dementia which causes some chest discomfort, but she is having no exertional discomforts, no change in her fluid status, and no tachy- or brady-arrhythmias.       Patient Active Problem List   Diagnosis Code   . Family history of colon cancer V16.0   . Abdominal pain, acute, generalized 789.07, 338.19   . HYPERTENSION 401.9   . Aortic stenosis 424.1   . Carotid artery stenosis 433.10   . Preop cardiovascular exam V72.81   . Barrett esophagus 530.85   . GERD (gastroesophageal reflux disease) 530.81   . History of colon polyps V12.72   . High risk for colon cancer V49.89   . Dysphagia 787.20   . Diabetes mellitus (HCC) 250.00   . Hyperlipidemia 272.4       Past Medical History   Diagnosis Date   . Lipoma    . Colon polyps      with carpeting polyp in 2011   . Hyperthyroidism    . HH (hiatus hernia)    . Hypertension    . Barrett's esophagus    . Allergic rhinitis    . Osteoarthritis    . TIA (transient ischemic attack)    . Asthma    . Obesity    . Spinal stenosis    . Aortic stenosis 01/24/2010   . Shortness of breath    . Chest pain    . Transient cerebral ischemia    . Chronic back pain    . DVT (deep venous thrombosis) (HCC)    . Hyperlipidemia    . Hypothyroidism    . Neuropathy    . Carotid artery stenosis 06/21/2011     left   . Arthritis    . Neck pain    . Thyroid  disorder    . GERD (gastroesophageal reflux disease) 01/07/2012   . High blood pressure    . Diabetes mellitus (HCC)    . Sigmoid diverticulosis    . Dysphagia    . Constipation    . Sore throat    . Hyperlipidemia    . Aortic stenosis        Past Surgical History   Procedure Laterality Date   . Carpal tunnel release     . Hemorrhoid surgery  1983   . Knee arthroscopy       BILATERAL   . Cataract removal     . Colonoscopy  07-12-09     Dr Beckey Downing   . Upper gastrointestinal endoscopy  12-31-08     Dr Beckey Downing   . Appendectomy     . Nasal septum surgery       repair   . Cholecystectomy     .  Colonoscopy  11-06-05     Dr Beckey Downing   . Colonoscopy  12-17-03     Dr Beckey Downing   . Upper gastrointestinal endoscopy  12-21-03     Dr Beckey Downing   . Upper gastrointestinal endoscopy  10-30-05     Dr Beckey Downing   . Vascular surgery  03-20-12 [facility]     L carotid endarterectomy with Vascu-Guard patch repair. L cervical carotid arteriograms after endarterectomy   . Colonoscopy  01-14-09     Dr Beckey Downing   . Upper gastrointestinal endoscopy  12-31-08     Dr Beckey Downing   . Carotid endarterectomy  03-19-12     Dr Allyne Gee       Family History   Problem Relation Age of Onset   . Colon Cancer Mother    . Colon Cancer Brother    . Colon Cancer Brother    . Cancer Maternal Aunt    . Emphysema Father    . Heart Failure Father    . Colon Cancer Maternal Grandfather        History   Substance Use Topics   . Smoking status: Former Smoker -- 1.00 packs/day for 20 years   . Smokeless tobacco: Never Used      Comment: Stopped 1955   . Alcohol Use: No       [Allergies/Contraindications:  Pcn; Septra; Darvocet; Fentanyl; Meloxicam; Morphine; Pristiq; Ultracet; and Zanaflex]     Outpatient Prescriptions Marked as Taking for the 10/14/13 encounter (Office Visit) with [name], MD   Medication Sig Dispense Refill   . hydrochlorothiazide (MICROZIDE) 12.5 MG capsule Take 1 capsule by mouth every morning.  30 capsule  5   . losartan (COZAAR) 100 MG tablet  Take 1 tablet by mouth daily.  30 tablet  5   . cyclobenzaprine (FLEXERIL) 5 MG tablet Take 5 mg by mouth 3 times daily as needed for Muscle spasms.       Marland Kitchen albuterol (PROVENTIL HFA;VENTOLIN HFA) 108 (90 BASE) MCG/ACT inhaler Inhale 2 puffs into the lungs every 6 hours as needed for Wheezing.       . docusate sodium (COLACE) 100 MG capsule Take 100 mg by mouth 2 times daily.       . pantoprazole (PROTONIX) 40 MG tablet Take 40 mg by mouth 2 times daily.       Marland Kitchen therapeutic multivitamin-minerals (THERAGRAN-M) tablet Take 1 tablet by mouth daily.       Marland Kitchen atenolol (TENORMIN) 50 MG tablet Take 50 mg by mouth 2 times daily.       . clopidogrel (PLAVIX) 75 MG tablet Take 75 mg by mouth daily.       Marland Kitchen glipiZIDE (GLUCOTROL) 5 MG tablet Take 5 mg by mouth 2 times daily (before meals).       . metformin (GLUCOPHAGE-XR) 500 MG XR tablet Take 500 mg by mouth daily.       . budesonide-formoterol (SYMBICORT) 160-4.5 MCG/ACT AERO Inhale 2 puffs into the lungs 2 times daily.         . Loratadine (CLARITIN PO) Take 5 mg by mouth 2 times daily.       . ASPIRIN PO Take 81 mg by mouth daily.       Marland Kitchen CALCIUM PO Take 600 mg by mouth 2 times daily.         BP Readings from Last 3 Encounters:   10/14/13 158/88   07/20/13 182/81   04/29/13 142/84    Pulse Readings from Last  3 Encounters:   10/14/13 60   07/20/13 67   04/29/13 60        Review of Systems:  Constitutional:  Negative for fever, chills, diaphoresis, activity change, appetite change, fatigue and unexpected weight change.  HENT:  Negative for nosebleeds, facial swelling, rhinorrhea and neck stiffness.  RESPIRATORY:  Negative for apnea, cough, chest tightness, shortness of breath, wheezing and stridor.   CARDIOVASCULAR:  Negative for chest pain, palpitations and leg swelling.  GASTROINTESTINAL:   Negative for abdominal distention.  GENITOURINARY:  Negative for dysuria, urgency and frequency.  MUSCULOSKELETAL:   Negative for myalgia, arthralgia and gait problem.  SKIN:  Negative  for color change, pallor, rash and wound.  NEUROLOGICAL:   Negative for dizziness, tremors, speech difficulty, weakness and numbness.  HEMATOLOGICAL:   Does not bruise or bleed easily.  PSYCHIATRIC/BEHAVIORAL:   No anxiety or confusion.       Physical Exam:  GENERAL:  The patient is a pleasant @SEX @ in no apparent distress.  VITAL SIGNS:  @V @  @BMI @  HEENT:  Head is normocephalic.  NECK:  Supple, without increased jugular venous pressures.       LUNGS:  Lungs are clear.    HEART:  Rhythm is regular with a 1-2/6 systolic ejection murmur radiating into the carotids.     ABDOMEN:  Soft with bowel sounds intact.   EXTREMITIES:  No clubbing, cyanosis or edema.      NEUROLOGICAL:  Cranial nerves II through XII are grossly intact.    Assessment:  1.  Stress-induced chest discomfort in the setting of aortic stenosis.       Patient was examined for:    ICD-9-CM   1. Aortic stenosis 424.1   2. Stress due to spouse with dementia V61.49      Plan:  1.  Wellness visit in six months.  Return to see me in a year.   2.  Continue same medications.    3.  We are going to speak with Dr. Jolene Schimke regarding the dementia issue.        Judith Blonder. Chase Picket, M.D., Ph.D., F.A.C.C.  [facility], [place].     Cc:   Verlin Dike  [address]  [place] [address]  [phone] phone  [phone] fax

## 2014-01-21 ENCOUNTER — Encounter

## 2014-01-21 MED ORDER — LOSARTAN POTASSIUM 100 MG PO TABS
100 MG | ORAL_TABLET | Freq: Every day | ORAL | Status: DC
Start: 2014-01-21 — End: 2014-08-03

## 2014-03-11 ENCOUNTER — Encounter

## 2014-03-12 MED ORDER — HYDROCHLOROTHIAZIDE 12.5 MG PO CAPS
12.5 MG | ORAL_CAPSULE | Freq: Every morning | ORAL | Status: DC
Start: 2014-03-12 — End: 2014-08-04

## 2014-04-15 ENCOUNTER — Ambulatory Visit: Admit: 2014-04-15 | Discharge: 2014-04-15 | Payer: MEDICARE | Attending: Family | Primary: Internal Medicine

## 2014-04-15 DIAGNOSIS — I10 Essential (primary) hypertension: Secondary | ICD-10-CM

## 2014-04-15 NOTE — Progress Notes (Signed)
[facility], [place]  [address], [place]  [address]  Phone: [phone]  Fax: [phone]    OFFICE VISIT:  04/15/2014    Valerie Nicholson - DOB: 12-Jul-1930    Reason For Visit:  Valerie Nicholson is a 80 y.o. female who is here for 6 Month Follow-Up; and Hypertension     The patient states she is doing very well. She states that she is somewhat active at home and that her Bp has been better than normal for her.    Subjective  The patient's PCP monitors lab for cholesterol. The patient denies any  cardiac complaints such as exertional chest pain, shortness of breath, orthopnea, paroxysmal nocturnal dyspnea, syncope, presyncope, sustained arrhythmia and edema. The patient denies numbness or weakness to suggest cerebrovascular accident or transient ischemic attack.     Valerie Nicholson has the following history as recorded in EpicCare:    Patient Active Problem List    Diagnosis Date Noted   . Fatigue 04/15/2014   . Hyperlipidemia    . Diabetes mellitus (HCC) 07/07/2012   . Barrett esophagus 01/07/2012   . GERD (gastroesophageal reflux disease) 01/07/2012   . History of colon polyps 01/07/2012   . High risk for colon cancer 01/07/2012   . Dysphagia 01/07/2012   . Preop cardiovascular exam 07/04/2011   . Carotid artery stenosis 06/21/2011   . Aortic stenosis 01/24/2010   . HYPERTENSION    . Family history of colon cancer 12/16/2009   . Abdominal pain, acute, generalized 12/16/2009     Past Medical History   Diagnosis Date   . Lipoma    . Colon polyps      with carpeting polyp in 2011   . Hyperthyroidism    . HH (hiatus hernia)    . Hypertension    . Barrett's esophagus    . Allergic rhinitis    . Osteoarthritis    . TIA (transient ischemic attack)    . Asthma    . Obesity    . Spinal stenosis    . Aortic stenosis 01/24/2010   . Shortness of breath    . Chest pain    . Transient cerebral ischemia    . Chronic back pain    . DVT (deep venous thrombosis) (HCC)    . Hyperlipidemia    .  Hypothyroidism    . Neuropathy    . Carotid artery stenosis 06/21/2011     left   . Arthritis    . Neck pain    . Thyroid disorder    . GERD (gastroesophageal reflux disease) 01/07/2012   . High blood pressure    . Diabetes mellitus (HCC)    . Sigmoid diverticulosis    . Dysphagia    . Constipation    . Sore throat    . Hyperlipidemia    . Aortic stenosis    . High risk for colon cancer 01/07/2012     Past Surgical History   Procedure Laterality Date   . Carpal tunnel release     . Hemorrhoid surgery  1983   . Knee arthroscopy       BILATERAL   . Cataract removal     . Colonoscopy  07-12-09     Dr Beckey Downing   . Upper gastrointestinal endoscopy  12-31-08     Dr Beckey Downing   . Appendectomy     . Nasal septum surgery       repair   .  Cholecystectomy     . Colonoscopy  11-06-05     Dr Beckey Downing   . Colonoscopy  12-17-03     Dr Beckey Downing   . Upper gastrointestinal endoscopy  12-21-03     Dr Beckey Downing   . Upper gastrointestinal endoscopy  10-30-05     Dr Beckey Downing   . Vascular surgery  03-20-12 [facility]     L carotid endarterectomy with Vascu-Guard patch repair. L cervical carotid arteriograms after endarterectomy   . Colonoscopy  01-14-09     Dr Beckey Downing   . Upper gastrointestinal endoscopy  12-31-08     Dr Beckey Downing   . Carotid endarterectomy  03-19-12     Dr Allyne Gee     Family History   Problem Relation Age of Onset   . Colon Cancer Mother    . Colon Cancer Brother    . Colon Cancer Brother    . Cancer Maternal Aunt    . Emphysema Father    . Heart Failure Father    . Colon Cancer Maternal Grandfather      History   Substance Use Topics   . Smoking status: Former Smoker -- 1.00 packs/day for 20 years   . Smokeless tobacco: Never Used      Comment: Stopped 1955   . Alcohol Use: No      Current Outpatient Prescriptions   Medication Sig Dispense Refill   . cycloSPORINE (RESTASIS) 0.05 % ophthalmic emulsion 1 drop 2 times daily     . olopatadine (PATANASE) 0.6 % SOLN nassl soln 2 sprays by Nasal route 2 times daily     . atorvastatin  (LIPITOR) 80 MG tablet Take 80 mg by mouth daily     . hydrochlorothiazide (MICROZIDE) 12.5 MG capsule Take 1 capsule by mouth every morning 30 capsule 5   . losartan (COZAAR) 100 MG tablet Take 1 tablet by mouth daily 30 tablet 5   . cyclobenzaprine (FLEXERIL) 5 MG tablet Take 5 mg by mouth 3 times daily as needed for Muscle spasms.     Marland Kitchen albuterol (PROVENTIL HFA;VENTOLIN HFA) 108 (90 BASE) MCG/ACT inhaler Inhale 2 puffs into the lungs every 6 hours as needed for Wheezing.     . docusate sodium (COLACE) 100 MG capsule Take 100 mg by mouth 2 times daily.     . pantoprazole (PROTONIX) 40 MG tablet Take 40 mg by mouth 2 times daily.     Marland Kitchen therapeutic multivitamin-minerals (THERAGRAN-M) tablet Take 1 tablet by mouth daily.     Marland Kitchen atenolol (TENORMIN) 50 MG tablet Take 50 mg by mouth 2 times daily.     . clopidogrel (PLAVIX) 75 MG tablet Take 75 mg by mouth daily.     Marland Kitchen glipiZIDE (GLUCOTROL) 5 MG tablet Take 5 mg by mouth 2 times daily (before meals).     . metformin (GLUCOPHAGE-XR) 500 MG XR tablet Take 500 mg by mouth daily.     . budesonide-formoterol (SYMBICORT) 160-4.5 MCG/ACT AERO Inhale 2 puffs into the lungs 2 times daily.       . Loratadine (CLARITIN PO) Take 5 mg by mouth 2 times daily.     . ASPIRIN PO Take 81 mg by mouth daily.     Marland Kitchen CALCIUM PO Take 600 mg by mouth 2 times daily.       No current facility-administered medications for this visit.     Allergies: Pcn; Septra; Darvocet; Fentanyl; Meloxicam; Morphine; Pristiq; Ultracet; and Zanaflex    Review of Systems  Constitutional -  no significant activity change, appetite change, or unexpected weight change. No fever, chills or diaphoresis.  No fatigue.   HEENT - no significant rhinorrhea or epistaxis. No tinnitus or significant hearing loss.   Eyes - no sudden vision change or amaurosis. No corneal arcus, xantholasma, subconjunctival hemorrhage or discharge.  Respiratory - no significant wheezing, stridor, apnea or cough.  No dyspnea on exertion or shortness  of breath.  Cardiovascular - no exertional chest pain, orthopnea or PND.  No sensation of sustained arrythmia or slow heart rate.   No claudication or leg edema.  Gastrointestinal - no abdominal swelling or pain. No blood in stool. No severe constipation, diarrhea, nausea, or vomiting.   Genitourinary - no dysuria, frequency, or urgency. No flank pain or hematuria.   Musculoskeletal - no back pain, gait disturbance, or myalgia.    Extremities - no clubbing, cyanosis or edema.  Skin - no color change or rash.  No pallor.  No new surgical incision.  Neurologic - no speech difficulty, facial asymmetry or lateralizing weakness.  No seizures, presyncope, syncope, or significant dizziness.  Hematologic - no easy bruising or excessive bleeding.   Psychiatric - no severe anxiety or insomnia. Normal affect.  No confusion. The patient denies any depression or suicidal ideation at this time.  All other review of systems are negative.      Objective  Vital Signs - BP 140/90 mmHg  Pulse 72  Ht 4\' 11"  (1.499 m)  Wt 212 lb (96.163 kg)  BMI 42.80 kg/m2  General - [place]vannah is alert, cooperative, and pleasant.  Well groomed.  No acute distress.    Body habitus - Body mass index is 42.8 kg/(m^2).  HEENT - Head is normocephalic. No circumoral cyanosis.  Dentition is normal.  EYES -   Lids normal without ptosis.  No discharge, edema or subconjunctival hemorrhage.   Neck - Symmetrical without apparent mass or lymphadenopathy.   Respiratory - Normal respiratory effort without use of accessory muscles.  Ausculatation reveals vesicular breath sounds without crackles, wheezes, rub or rhonchi.    Cardiovascular - No jugular venous distention.  Auscultation reveals regular rate and rhythm.  No audible clicks, gallop or rub.  No murmur.  No lower extremity varicosities.  No carotid bruits.  Peripheral pulses:   Abdominal -  No visible distention, mass or pulsations.  Extremities - No clubbing or cyanosis.  No statis dermatitis or ulcers.  Noedema.    Musculoskeletal - Gait is even and regular without limp or shuffle.  No Osler's nodes.  No kyphosis or scoliosis.  Ambulates with cane per assistance.  Skin -  Warm and dry; no rash or pallor.   No new surgical wound.  Neurological - No focal neurological deficits.  Thought processes coherent.  No apparent tremor.   Oriented to person, place and time.    Psychiatric -  Appropriate affect and mood.     Assessment:     Stable cardiovascular status. No evidence of overt heart failure, angina or dysrhythmia  1. Essential hypertension    2. Mixed hyperlipidemia    3. Other fatigue        Patient Instructions       High Blood Pressure: Care Instructions  Your Care Instructions  If your blood pressure is usually above 140/90, you have high blood pressure, or hypertension. Despite what a lot of people think, high blood pressure usually doesn't cause headaches or make you feel dizzy or lightheaded. It usually has no symptoms. But it  does increase your risk for heart attack, stroke, and kidney or eye damage. The higher your blood pressure, the more your risk increases.  Your doctor will give you a goal for your blood pressure. Your goal will be based on your health and your age. An example of a goal is to keep your blood pressure below 140/90.  Lifestyle changes, such as eating healthy and being active, are always important to help lower blood pressure. You might also take medicine to reach your blood pressure goal.  Follow-up care is a key part of your treatment and safety. Be sure to make and go to all appointments, and call your doctor if you are having problems. It's also a good idea to know your test results and keep a list of the medicines you take.  How can you care for yourself at home?  Medical treatment   If you stop taking your medicine, your blood pressure will go back up. You may take one or more types of medicine to lower your blood pressure. Be safe with medicines. Take your medicine exactly as  prescribed. Call your doctor if you think you are having a problem with your medicine.   Your doctor may suggest that you take one low-dose aspirin (81 mg) a day. This can help reduce your risk of having a stroke or heart attack.   See your doctor regularly. You may need to see the doctor more often at first or until your blood pressure comes down.   If you are taking blood pressure medicine, talk to your doctor before you take decongestants or anti-inflammatory medicine, such as ibuprofen. Some of these medicines can raise blood pressure.   Learn how to check your blood pressure at home.  Lifestyle changes   Stay at a healthy weight. This is especially important if you put on weight around the waist. Losing even 10 pounds can help you lower your blood pressure.   If your doctor recommends it, get more exercise. Walking is a good choice. Bit by bit, increase the amount you walk every day. Try for at least 30 minutes on most days of the week. You also may want to swim, bike, or do other activities.   Avoid or limit alcohol. Talk to your doctor about whether you can drink any alcohol.   Try to limit how much sodium you eat to less than 2,300 milligrams (mg) a day. Your doctor may ask you to try to eat less than 1,500 mg a day.   Eat plenty of fruits (such as bananas and oranges), vegetables, legumes, whole grains, and low-fat dairy products.   Lower the amount of saturated fat in your diet. Saturated fat is found in animal products such as milk, cheese, and meat. Limiting these foods may help you lose weight and also lower your risk for heart disease.   Do not smoke. Smoking increases your risk for heart attack and stroke. If you need help quitting, talk to your doctor about stop-smoking programs and medicines. These can increase your chances of quitting for good.  When should you call for help?  Call your doctor now or seek immediate medical care if:   Your blood pressure is much higher than normal (such  as 180/110 or higher).   You think high blood pressure is causing symptoms such as:   Severe headache.   Blurry vision.  Watch closely for changes in your health, and be sure to contact your doctor if:   You do not  get better as expected.   Where can you learn more?   Go to [email or web address] and sign in to your MyChart account. Enter [phone] in the Search Health Information box to learn more about "High Blood Pressure: Care Instructions."    If you do not have an account, please click on the "Sign Up Now" link.      2006-2015 [employer]. Care instructions adapted under license by [facility]. This care instruction is for use with your licensed healthcare professional. If you have questions about a medical condition or this instruction, always ask your healthcare professional. [employer], [employer] disclaims any warranty or liability for your use of this information.  Content Version: 10.6.465758; Current as of: June 19, 2013              High Cholesterol: Care Instructions  Your Care Instructions  Cholesterol is a type of fat in your blood. It is needed for many body functions, such as making new cells. Cholesterol is made by your body. It also comes from food you eat. High cholesterol means that you have too much of the fat in your blood. This raises your risk of a heart attack and stroke.  LDL and HDL are part of your total cholesterol. LDL is the "bad" cholesterol. High LDL can raise your risk for heart disease, heart attack, and stroke. HDL is the "good" cholesterol. It helps clear bad cholesterol from the body. High HDL is linked with a lower risk of heart disease, heart attack, and stroke.  Your cholesterol levels help your doctor find out your risk for having a heart attack or stroke. You and your doctor can talk about whether you need to lower your risk and what treatment is best for you.  A heart-healthy lifestyle along with medicines can help lower your  cholesterol and your risk. The way you choose to lower your risk will depend on how high your risk is for heart attack and stroke. It will also depend on how you feel about taking medicines.  Follow-up care is a key part of your treatment and safety. Be sure to make and go to all appointments, and call your doctor if you are having problems. It's also a good idea to know your test results and keep a list of the medicines you take.  How can you care for yourself at home?   Eat a variety of foods every day. Good choices include fruits, vegetables, whole grains (like oatmeal), dried beans and peas, nuts and seeds, soy products (like tofu), and fat-free or low-fat dairy products.   Replace butter, margarine, and hydrogenated or partially hydrogenated oils with olive and canola oils. (Canola oil margarine without trans fat is fine.)   Replace red meat with fish, poultry, and soy protein (like tofu).   Limit processed and packaged foods like chips, crackers, and cookies.   Bake, broil, or steam foods. Don't fry them.   Limit foods high in cholesterol. These include egg yolks.   Be physically active. Get at least 30 minutes of exercise on most days of the week. Walking is a good choice. You also may want to do other activities, such as running, swimming, cycling, or playing tennis or team sports.   Stay at a healthy weight or lose weight by making the changes in eating and physical activity listed above. Losing just a small amount of weight, even 5 to 10 pounds, can reduce your risk for having a heart attack or stroke.   Do not  smoke.  When should you call for help?  Watch closely for changes in your health, and be sure to contact your doctor if:   You need help making lifestyle changes.   You have questions about your medicine.   Where can you learn more?   Go to [email or web address] and sign in to your MyChart account. Enter [phone] in the Search Health Information box to learn more about "High  Cholesterol: Care Instructions."    If you do not have an account, please click on the "Sign Up Now" link.      2006-2015 [employer]. Care instructions adapted under license by [facility]. This care instruction is for use with your licensed healthcare professional. If you have questions about a medical condition or this instruction, always ask your healthcare professional. [employer] disclaims any warranty or liability for your use of this information.  Content Version: 10.6.465758; Current as of: June 19, 2013                     Plan  Continue current medications as prescribed.   Continue heart healthy diet.   Exercise as tolerated.  Strive for 15 minutes of exercise most days of the week.    Blood pressure goal is 140/90 or less. If you are a diabetic, the goal is 130/80 or less.   Continue to follow up with primary care provider for medical concerns.   Call with any problems, questions or concerns.   Follow up as scheduled.  Educational material provided *see after visit summary    Cardiac protection and prevention along with medication/education given  HTN-stable CPT  Hyperlipidemia-managed per PCP  Fatigue-follow        Additional instructions: As always patient advised to bring in medication bottles in order to correctly reconcile with our current list.    For any patient on anticoagulation or antiplatelets , they are advised to given no less that 10 days notice prior surgical procedure in order withhold medication(s)    If you are in need of any upcoming procedures or surgery and will be requesting cardiac clearance, you will need to notify our office no less than 2 weeks prior to the procedure     Chip Boer, APRN

## 2014-04-15 NOTE — Patient Instructions (Signed)
High Blood Pressure: Care Instructions  Your Care Instructions  If your blood pressure is usually above 140/90, you have high blood pressure, or hypertension. Despite what a lot of people think, high blood pressure usually doesn't cause headaches or make you feel dizzy or lightheaded. It usually has no symptoms. But it does increase your risk for heart attack, stroke, and kidney or eye damage. The higher your blood pressure, the more your risk increases.  Your doctor will give you a goal for your blood pressure. Your goal will be based on your health and your age. An example of a goal is to keep your blood pressure below 140/90.  Lifestyle changes, such as eating healthy and being active, are always important to help lower blood pressure. You might also take medicine to reach your blood pressure goal.  Follow-up care is a key part of your treatment and safety. Be sure to make and go to all appointments, and call your doctor if you are having problems. It's also a good idea to know your test results and keep a list of the medicines you take.  How can you care for yourself at home?  Medical treatment   If you stop taking your medicine, your blood pressure will go back up. You may take one or more types of medicine to lower your blood pressure. Be safe with medicines. Take your medicine exactly as prescribed. Call your doctor if you think you are having a problem with your medicine.   Your doctor may suggest that you take one low-dose aspirin (81 mg) a day. This can help reduce your risk of having a stroke or heart attack.   See your doctor regularly. You may need to see the doctor more often at first or until your blood pressure comes down.   If you are taking blood pressure medicine, talk to your doctor before you take decongestants or anti-inflammatory medicine, such as ibuprofen. Some of these medicines can raise blood pressure.   Learn how to check your blood pressure at home.  Lifestyle changes   Stay at  a healthy weight. This is especially important if you put on weight around the waist. Losing even 10 pounds can help you lower your blood pressure.   If your doctor recommends it, get more exercise. Walking is a good choice. Bit by bit, increase the amount you walk every day. Try for at least 30 minutes on most days of the week. You also may want to swim, bike, or do other activities.   Avoid or limit alcohol. Talk to your doctor about whether you can drink any alcohol.   Try to limit how much sodium you eat to less than 2,300 milligrams (mg) a day. Your doctor may ask you to try to eat less than 1,500 mg a day.   Eat plenty of fruits (such as bananas and oranges), vegetables, legumes, whole grains, and low-fat dairy products.   Lower the amount of saturated fat in your diet. Saturated fat is found in animal products such as milk, cheese, and meat. Limiting these foods may help you lose weight and also lower your risk for heart disease.   Do not smoke. Smoking increases your risk for heart attack and stroke. If you need help quitting, talk to your doctor about stop-smoking programs and medicines. These can increase your chances of quitting for good.  When should you call for help?  Call your doctor now or seek immediate medical care if:   Your blood  pressure is much higher than normal (such as 180/110 or higher).   You think high blood pressure is causing symptoms such as:   Severe headache.   Blurry vision.  Watch closely for changes in your health, and be sure to contact your doctor if:   You do not get better as expected.   Where can you learn more?   Go to [email or web address] and sign in to your MyChart account. Enter [phone] in the Search Health Information box to learn more about "High Blood Pressure: Care Instructions."    If you do not have an account, please click on the "Sign Up Now" link.      2006-2015 [employer], [employer]. Care instructions adapted under license by [facility]. This care instruction is for use with your licensed healthcare professional. If you have questions about a medical condition or this instruction, always ask your healthcare professional. Healthwise, Incorporated disclaims any warranty or liability for your use of this information.  Content Version: 10.6.465758; Current as of: June 19, 2013              High Cholesterol: Care Instructions  Your Care Instructions  Cholesterol is a type of fat in your blood. It is needed for many body functions, such as making new cells. Cholesterol is made by your body. It also comes from food you eat. High cholesterol means that you have too much of the fat in your blood. This raises your risk of a heart attack and stroke.  LDL and HDL are part of your total cholesterol. LDL is the "bad" cholesterol. High LDL can raise your risk for heart disease, heart attack, and stroke. HDL is the "good" cholesterol. It helps clear bad cholesterol from the body. High HDL is linked with a lower risk of heart disease, heart attack, and stroke.  Your cholesterol levels help your doctor find out your risk for having a heart attack or stroke. You and your doctor can talk about whether you need to lower your risk and what treatment is best for you.  A heart-healthy lifestyle along with medicines can help lower your cholesterol and your risk. The way you choose to lower your risk will depend on how high your risk is for heart attack and stroke. It will also depend on how you feel about taking medicines.  Follow-up care is a key part of your treatment and safety. Be sure to make and go to all appointments, and call your doctor if you are having problems. It's also a good idea to know your test results and keep a list of the medicines you take.  How can you care for yourself at home?   Eat a variety of foods every day. Good choices include fruits, vegetables, whole grains (like oatmeal), dried beans and peas, nuts and seeds, soy products (like  tofu), and fat-free or low-fat dairy products.   Replace butter, margarine, and hydrogenated or partially hydrogenated oils with olive and canola oils. (Canola oil margarine without trans fat is fine.)   Replace red meat with fish, poultry, and soy protein (like tofu).   Limit processed and packaged foods like chips, crackers, and cookies.   Bake, broil, or steam foods. Don't fry them.   Limit foods high in cholesterol. These include egg yolks.   Be physically active. Get at least 30 minutes of exercise on most days of the week. Walking is a good choice. You also may want to do other activities, such as running, swimming, cycling, or  playing tennis or team sports.   Stay at a healthy weight or lose weight by making the changes in eating and physical activity listed above. Losing just a small amount of weight, even 5 to 10 pounds, can reduce your risk for having a heart attack or stroke.   Do not smoke.  When should you call for help?  Watch closely for changes in your health, and be sure to contact your doctor if:   You need help making lifestyle changes.   You have questions about your medicine.   Where can you learn more?   Go to [email or web address] and sign in to your MyChart account. Enter [phone] in the Search Health Information box to learn more about "High Cholesterol: Care Instructions."    If you do not have an account, please click on the "Sign Up Now" link.      2006-2015 [employer]wise, Inc[employer]. Care instructions adapted under license by [facility]. This care instruction is for use with your licensed healthcare professional. If you have questions about a medical condition or this instruction, always ask your healthcare professional. Healthwise, Incorporated disclaims any warranty or liability for your use of this information.  Content Version: 10.6.465758; Current as of: June 19, 2013

## 2014-05-21 ENCOUNTER — Encounter: Attending: Nurse Practitioner | Primary: Internal Medicine

## 2014-06-07 ENCOUNTER — Encounter: Attending: Nurse Practitioner | Primary: Internal Medicine

## 2014-07-05 NOTE — Telephone Encounter (Signed)
Pt reported intermittent chest pain that started about 4-5 days ago while having bowel movement.  She reported history of severe constipation and chest pain occurs during straining.   appt scheduled.

## 2014-07-06 ENCOUNTER — Ambulatory Visit: Admit: 2014-07-06 | Discharge: 2014-07-06 | Payer: MEDICARE | Attending: Family | Primary: Internal Medicine

## 2014-07-06 DIAGNOSIS — R072 Precordial pain: Secondary | ICD-10-CM

## 2014-07-06 NOTE — Patient Instructions (Signed)
Register at the [facility] located on the first floor of [facility].   Enter through hospital main entrance and turn immediately to your left.    Allergies:  Pcn; Septra; Darvocet; Fentanyl; Meloxicam; Morphine; Pristiq; Ultracet; and Zanaflex     Patient contact number:  [phone] (home)      Lexiscan Stress Test  -  [order: CAR16]    Lexiscan (regadenoson injection) is a prescription drug given through an IV line that increases blood flow through the arteries of the heart during a cardiac nuclear stress test.     There are two parts to a Lexiscan stress test: the rest portion and the exercise portion. For the rest portion, a radioactive tracer is injected into your arm through the IV.  After 30 to 60 minutes, the process of imaging will begin.  A nuclear camera will be placed on your chest area and images are taken for the next 15 to 20 minutes.  For the exercise portion, a nurse will attach EKG electrodes to your chest to monitor your heart rate.  The drug Lexiscan is administered to simulate stress on the heart.  Your heart rhythm will then be monitored for the next few minutes. Your blood pressure will also be monitored throughout the exercise portion.  Midway through the exercise portion, a second round of radioactive tracer is injected into your body.  Your heart rate and EKG will be monitored for another few minutes after administering the drug.    Test Preparation:    . Bring a list of your current medications.  Do not take any of your medications the morning of the test, but bring all morning medications with you as you will take them after the stress portion of the test is completed.    . No caffeine 12 hours prior to the testing.  This includes: coffee, pop/soda, chocolate, cold medications, etc.  Any product that might contain caffeine.    . No nicotine or alcohol 12 hours prior to your test.   . Nothing to eat or drink 4-6 hours prior to appointment time.  It  is okay to drink small amounts of water during the four hours prior to the test.  . Nitroglycerin patches must be taken off 1 hour before testing.    . Wear comfortable clothing.   . Please refrain from any strenuous exercise or activities the day before your test, or the day of your test.  . The Nuclear Lexiscan Stress test takes about 2  to 3 hours to complete.    If for any reason you are unable to keep this appointment, please contact Outpatient Scheduling, [phone], as soon as possible to reschedule.

## 2014-07-06 NOTE — Progress Notes (Signed)
[facility], [place]  [address], [place]  [address]  Phone: [phone]  Fax: [phone]    OFFICE VISIT:  07/06/2014    Valerie Nicholson - DOB: Sep 19, 1930    Reason For Visit:  Valerie Nicholson is a 81 y.o. female who is here for Follow-up; Chest Pain; and Shortness of Breath  Patient reports that she has been having intermittent chest pain for the last two months. She states that the pain has began to worsen since then. She also states that in addition to her chest pain she is experiencing SOA.    The patient states that her pain is precordial and radiates to her back. She states that it comes and goes along with SOA. She states that she must sit down for relief.    Subjective  The patient's PCP monitors lab for cholesterol. The patient denies any  cardiac complaints such as orthopnea, paroxysmal nocturnal dyspnea, syncope, presyncope, sustained arrhythmia and edema. The patient denies numbness or weakness to suggest cerebrovascular accident or transient ischemic attack.     Valerie Nicholson has the following history as recorded in EpicCare:    Patient Active Problem List    Diagnosis Date Noted   . Precordial pain 07/06/2014   . Abnormal EKG 07/06/2014   . DOE (dyspnea on exertion) 07/06/2014   . Fatigue 04/15/2014   . Hyperlipidemia    . Diabetes mellitus (HCC) 07/07/2012   . Barrett esophagus 01/07/2012   . GERD (gastroesophageal reflux disease) 01/07/2012   . History of colon polyps 01/07/2012   . High risk for colon cancer 01/07/2012   . Dysphagia 01/07/2012   . Preop cardiovascular exam 07/04/2011   . Carotid artery stenosis 06/21/2011   . Aortic stenosis 01/24/2010   . HYPERTENSION    . Family history of colon cancer 12/16/2009   . Abdominal pain, acute, generalized 12/16/2009     Past Medical History   Diagnosis Date   . Lipoma    . Colon polyps      with carpeting polyp in 2011   . Hyperthyroidism    . HH (hiatus hernia)    . Hypertension    . Barrett's esophagus    . Allergic  rhinitis    . Osteoarthritis    . TIA (transient ischemic attack)    . Asthma    . Obesity    . Spinal stenosis    . Aortic stenosis 01/24/2010   . Shortness of breath    . Chest pain    . Transient cerebral ischemia    . Chronic back pain    . DVT (deep venous thrombosis) (HCC)    . Hyperlipidemia    . Hypothyroidism    . Neuropathy    . Carotid artery stenosis 06/21/2011     left   . Arthritis    . Neck pain    . Thyroid disorder    . GERD (gastroesophageal reflux disease) 01/07/2012   . High blood pressure    . Diabetes mellitus (HCC)    . Sigmoid diverticulosis    . Dysphagia    . Constipation    . Sore throat    . Hyperlipidemia    . Aortic stenosis    . High risk for colon cancer 01/07/2012     Past Surgical History   Procedure Laterality Date   . Carpal tunnel release     . Hemorrhoid surgery  1983   . Knee arthroscopy  BILATERAL   . Cataract removal     . Colonoscopy  07-12-09     Dr Beckey Downing   . Upper gastrointestinal endoscopy  12-31-08     Dr Beckey Downing   . Appendectomy     . Nasal septum surgery       repair   . Cholecystectomy     . Colonoscopy  11-06-05     Dr Beckey Downing   . Colonoscopy  12-17-03     Dr Beckey Downing   . Upper gastrointestinal endoscopy  12-21-03     Dr Beckey Downing   . Upper gastrointestinal endoscopy  10-30-05     Dr Beckey Downing   . Vascular surgery  03-20-12 [facility]     L carotid endarterectomy with Vascu-Guard patch repair. L cervical carotid arteriograms after endarterectomy   . Colonoscopy  01-14-09     Dr Beckey Downing   . Upper gastrointestinal endoscopy  12-31-08     Dr Beckey Downing   . Carotid endarterectomy  03-19-12     Dr Allyne Gee     Family History   Problem Relation Age of Onset   . Colon Cancer Mother    . Colon Cancer Brother    . Colon Cancer Brother    . Cancer Maternal Aunt    . Emphysema Father    . Heart Failure Father    . Colon Cancer Maternal Grandfather      History   Substance Use Topics   . Smoking status: Former Smoker -- 1.00 packs/day for 20 years   . Smokeless tobacco: Never Used       Comment: Stopped 1955   . Alcohol Use: No      Current Outpatient Prescriptions   Medication Sig Dispense Refill   . montelukast (SINGULAIR) 10 MG tablet Take 10 mg by mouth daily     . erythromycin (ROMYCIN) 5 MG/GM ophthalmic ointment Place into the left eye nightly Nightly.     . cycloSPORINE (RESTASIS) 0.05 % ophthalmic emulsion 1 drop 2 times daily     . olopatadine (PATANASE) 0.6 % SOLN nassl soln 2 sprays by Nasal route 2 times daily     . atorvastatin (LIPITOR) 80 MG tablet Take 80 mg by mouth daily     . hydrochlorothiazide (MICROZIDE) 12.5 MG capsule Take 1 capsule by mouth every morning 30 capsule 5   . losartan (COZAAR) 100 MG tablet Take 1 tablet by mouth daily 30 tablet 5   . albuterol (PROVENTIL HFA;VENTOLIN HFA) 108 (90 BASE) MCG/ACT inhaler Inhale 2 puffs into the lungs every 6 hours as needed for Wheezing.     . pantoprazole (PROTONIX) 40 MG tablet Take 40 mg by mouth 2 times daily.     Marland Kitchen therapeutic multivitamin-minerals (THERAGRAN-M) tablet Take 1 tablet by mouth daily.     Marland Kitchen atenolol (TENORMIN) 50 MG tablet Take 50 mg by mouth 2 times daily.     . clopidogrel (PLAVIX) 75 MG tablet Take 75 mg by mouth daily.     Marland Kitchen glipiZIDE (GLUCOTROL) 5 MG tablet Take 5 mg by mouth 2 times daily (before meals).     . metformin (GLUCOPHAGE-XR) 500 MG XR tablet Take 500 mg by mouth daily.     . budesonide-formoterol (SYMBICORT) 160-4.5 MCG/ACT AERO Inhale 2 puffs into the lungs 2 times daily.       . Loratadine (CLARITIN PO) Take 5 mg by mouth 2 times daily.     . ASPIRIN PO Take 81 mg by mouth daily.     Marland Kitchen  CALCIUM PO Take 600 mg by mouth 2 times daily.     Marland Kitchen docusate sodium (COLACE) 100 MG capsule Take 100 mg by mouth 2 times daily.       No current facility-administered medications for this visit.     Allergies: Pcn; Septra; Darvocet; Fentanyl; Meloxicam; Morphine; Pristiq; Ultracet; and Zanaflex    Review of Systems  Constitutional - no significant activity change, appetite change, or unexpected weight  change. No fever, chills or diaphoresis.  No fatigue.   HEENT - no significant rhinorrhea or epistaxis. No tinnitus or significant hearing loss.   Eyes - no sudden vision change or amaurosis. No corneal arcus, xantholasma, subconjunctival hemorrhage or discharge.  Respiratory - no significant wheezing, stridor, apnea or cough.    Cardiovascular - no orthopnea or PND.  No sensation of sustained arrythmia or slow heart rate.   No claudication or leg edema. Patient reports that she has been having intermittent chest pain for the last two months. She states that the pain has began to worsen since then. She also states that in addition to her chest pain she is experiencing SOA.    The patient states that her pain is precordial and radiates to her back. She states that it comes and goes along with SOA.  Gastrointestinal - no abdominal swelling or pain. No blood in stool. No severe constipation, diarrhea, nausea, or vomiting.   Genitourinary - no dysuria, frequency, or urgency. No flank pain or hematuria.   Musculoskeletal - no back pain, gait disturbance, or myalgia.    Extremities - no clubbing, cyanosis or edema.  Skin - no color change or rash.  No pallor.  No new surgical incision.  Neurologic - no speech difficulty, facial asymmetry or lateralizing weakness.  No seizures, presyncope, syncope, or significant dizziness.  Hematologic - no easy bruising or excessive bleeding.   Psychiatric - no severe anxiety or insomnia. Normal affect.  No confusion. The patient denies any depression or suicidal ideation at this time.  All other review of systems are negative.      Objective  Vital Signs - BP 132/96 mmHg  Pulse 58  Ht 5' (1.524 m)  Wt 210 lb (95.255 kg)  BMI 41.01 kg/m2  General - Alean is alert, cooperative, and pleasant.  Well groomed.  No acute distress.    Body habitus - Body mass index is 41.01 kg/(m^2).  HEENT - Head is normocephalic. No circumoral cyanosis.  Dentition is normal.  EYES -   Lids normal without  ptosis.  No discharge, edema or subconjunctival hemorrhage.   Neck - Symmetrical without apparent mass or lymphadenopathy.   Respiratory - Normal respiratory effort without use of accessory muscles.  Ausculatation reveals vesicular breath sounds without crackles, wheezes, rub or rhonchi.    Cardiovascular - No jugular venous distention.  Auscultation reveals regular rate and rhythm.  No audible clicks, gallop or rub.  No murmur.  No lower extremity varicosities.  No carotid bruits.  Peripheral pulses:   Abdominal -  No visible distention, mass or pulsations.  Extremities - No clubbing or cyanosis.  No statis dermatitis or ulcers. Noedema.    Musculoskeletal - Gait is even and regular without limp or shuffle.  No Osler's nodes.  No kyphosis or scoliosis.  Ambulates with walker per assistance.  Skin -  Warm and dry; no rash or pallor.   No new surgical wound.  Neurological - No focal neurological deficits.  Thought processes coherent.  No apparent tremor.  Oriented to person, place and time.    Psychiatric -  Appropriate affect and mood.     Assessment:      1. DOE (dyspnea on exertion)  Stress Test W Pharm    ECHO Complete 2D W Doppler W Color   2. Essential hypertension  Stress Test W Pharm   3. Mixed hyperlipidemia  Stress Test W Pharm   4. Precordial pain  Stress Test W Pharm   5. Abnormal EKG  Stress Test W Pharm       Patient Instructions   Register at the [facility] located on the first floor of [facility].   Enter through hospital main entrance and turn immediately to your left.    Allergies:  Pcn; Septra; Darvocet; Fentanyl; Meloxicam; Morphine; Pristiq; Ultracet; and Zanaflex     Patient contact number:  [phone] (home)      Lexiscan Stress Test  -  [order: CAR16]    Lexiscan (regadenoson injection) is a prescription drug given through an IV line that increases blood flow through the arteries of the heart during a cardiac nuclear stress test.     There are two  parts to a Lexiscan stress test: the rest portion and the exercise portion. For the rest portion, a radioactive tracer is injected into your arm through the IV.  After 30 to 60 minutes, the process of imaging will begin.  A nuclear camera will be placed on your chest area and images are taken for the next 15 to 20 minutes.  For the exercise portion, a nurse will attach EKG electrodes to your chest to monitor your heart rate.  The drug Lexiscan is administered to simulate stress on the heart.  Your heart rhythm will then be monitored for the next few minutes. Your blood pressure will also be monitored throughout the exercise portion.  Midway through the exercise portion, a second round of radioactive tracer is injected into your body.  Your heart rate and EKG will be monitored for another few minutes after administering the drug.    Test Preparation:    . Bring a list of your current medications.  Do not take any of your medications the morning of the test, but bring all morning medications with you as you will take them after the stress portion of the test is completed.    . No caffeine 12 hours prior to the testing.  This includes: coffee, pop/soda, chocolate, cold medications, etc.  Any product that might contain caffeine.    . No nicotine or alcohol 12 hours prior to your test.   . Nothing to eat or drink 4-6 hours prior to appointment time.  It is okay to drink small amounts of water during the four hours prior to the test.  . Nitroglycerin patches must be taken off 1 hour before testing.    . Wear comfortable clothing.   . Please refrain from any strenuous exercise or activities the day before your test, or the day of your test.  . The Nuclear Lexiscan Stress test takes about 2  to 3 hours to complete.    If for any reason you are unable to keep this appointment, please contact Outpatient Scheduling, [phone], as soon as possible to reschedule.             Plan  Continue current medications as prescribed.    Continue heart healthy diet.   Exercise as tolerated.  Strive for 15 minutes of exercise most days  of the week.    Blood pressure goal is 140/90 or less. If you are a diabetic, the goal is 130/80 or less.   Continue to follow up with primary care provider for medical concerns.   Call with any problems, questions or concerns.   Follow up as scheduled.  Educational material provided *see after visit summary    Cardiac protection and prevention along with medication/education given  Chest pain-Lexi and echo scheduled / the patient is unable to ambulate per walker  DOE- as above  Abnormal EKG  HTN-stable CPT  Hyperlipidemia-managed per PCP      Additional instructions: As always patient advised to bring in medication bottles in order to correctly reconcile with our current list.    For any patient on anticoagulation or antiplatelets , they are advised to given no less that 10 days notice prior surgical procedure in order withhold medication(s)    If you are in need of any upcoming procedures or surgery and will be requesting cardiac clearance, you will need to notify our office no less than 2 weeks prior to the procedure     Chip Boer, APRN

## 2014-07-09 MED ORDER — GLUCOSE BLOOD VI DISK
Freq: Two times a day (BID) | Status: DC
Start: 2014-07-09 — End: 2014-09-15

## 2014-07-09 NOTE — Telephone Encounter (Signed)
Pharmacy sent a refill request for breeze 2 test strips.  Sent into [facility]

## 2014-07-12 NOTE — Telephone Encounter (Signed)
Pt left message she needs to cancel Lexiscan and 2D Echo scheduled for tomorrow.  Her message said "something has happened at home".

## 2014-07-13 ENCOUNTER — Ambulatory Visit: Payer: MEDICARE | Primary: Internal Medicine

## 2014-07-13 NOTE — Telephone Encounter (Signed)
Called and rescheduled patients 2 D Echo and Lexiscan for 07-20-14.   Understanding voiced.

## 2014-07-20 ENCOUNTER — Inpatient Hospital Stay: Admit: 2014-07-20 | Payer: MEDICARE | Primary: Internal Medicine

## 2014-07-20 ENCOUNTER — Encounter

## 2014-07-20 ENCOUNTER — Inpatient Hospital Stay: Payer: MEDICARE | Primary: Internal Medicine

## 2014-07-20 DIAGNOSIS — I10 Essential (primary) hypertension: Secondary | ICD-10-CM

## 2014-07-20 DIAGNOSIS — R9431 Abnormal electrocardiogram [ECG] [EKG]: Secondary | ICD-10-CM

## 2014-07-20 LAB — ECHOCARDIOGRAM COMPLETE 2D W DOPPLER W COLOR: Left Ventricular Ejection Fraction: 58

## 2014-07-21 ENCOUNTER — Encounter: Attending: Cardiovascular Disease | Primary: Internal Medicine

## 2014-07-21 NOTE — Telephone Encounter (Signed)
Pt called and said Dr. Chase Picket told her to take clonidine and she doesn't like the way it makes her feel

## 2014-07-22 ENCOUNTER — Ambulatory Visit: Admit: 2014-07-22 | Discharge: 2014-07-22 | Payer: MEDICARE | Attending: Adult Health | Primary: Internal Medicine

## 2014-07-22 ENCOUNTER — Encounter

## 2014-07-22 ENCOUNTER — Inpatient Hospital Stay: Admit: 2014-07-22 | Payer: MEDICARE | Primary: Internal Medicine

## 2014-07-22 DIAGNOSIS — I6523 Occlusion and stenosis of bilateral carotid arteries: Secondary | ICD-10-CM

## 2014-07-22 NOTE — Progress Notes (Signed)
Patient Care Team:  Thayer Ohm, APRN as PCP - General (Nurse Practitioner)  Kermit Balo, MD (Gastroenterology)  Sharlyn Bologna, MD  Joneen Boers, MD (Cardiology)      Subjective    She presents for follow-up of carotid artery stenosis. She has prior history of carotid occlusive disease for 1 - 5 years. Her current treatment includes ASA EC daily.  She denies a history of CVA.  She reports no TIA's, episodes of amaurosis fugax and episodes of lateralizing weakness.    She did come in today again concerned about what narcotic was given to her during surgery 3 years ago.  I had previously gone over this with her and again went through her hospital records and reassured her that she did not get anything she was allergic to.  She also, again, told me about getting sick and it getting in her ear and getting an infection a few months after surgery.      Valerie Nicholson is a 81 y.o. female with the following history reviewed and recorded in Epic[facility]:  Patient Active Problem List    Diagnosis Date Noted   . Precordial pain 07/06/2014   . Abnormal EKG 07/06/2014   . DOE (dyspnea on exertion) 07/06/2014   . Fatigue 04/15/2014     Off and on but not new     . Hyperlipidemia    . Diabetes mellitus (HCC) 07/07/2012   . Barrett esophagus 01/07/2012   . GERD (gastroesophageal reflux disease) 01/07/2012   . History of colon polyps 01/07/2012   . High risk for colon cancer 01/07/2012   . Dysphagia 01/07/2012   . Preop cardiovascular exam 07/04/2011   . Carotid artery stenosis 06/21/2011   . Aortic stenosis 01/24/2010   . HYPERTENSION    . Family history of colon cancer 12/16/2009     PT HAS STRONG FAMILY HISTORY OF CLN CA (MOM, GRANDFATHER, 2 BROTHERS, COUSINS SON)     . Abdominal pain, acute, generalized 12/16/2009     Current Outpatient Prescriptions   Medication Sig Dispense Refill   . Glucose Blood DISK 1 each by Does not apply route 2 times daily Dx: E11.9 100 each 3   . montelukast (SINGULAIR)  10 MG tablet Take 10 mg by mouth daily     . erythromycin (ROMYCIN) 5 MG/GM ophthalmic ointment Place into the left eye nightly Nightly.     . cycloSPORINE (RESTASIS) 0.05 % ophthalmic emulsion 1 drop 2 times daily     . olopatadine (PATANASE) 0.6 % SOLN nassl soln 2 sprays by Nasal route 2 times daily     . atorvastatin (LIPITOR) 80 MG tablet Take 80 mg by mouth daily     . hydrochlorothiazide (MICROZIDE) 12.5 MG capsule Take 1 capsule by mouth every morning 30 capsule 5   . losartan (COZAAR) 100 MG tablet Take 1 tablet by mouth daily 30 tablet 5   . albuterol (PROVENTIL HFA;VENTOLIN HFA) 108 (90 BASE) MCG/ACT inhaler Inhale 2 puffs into the lungs every 6 hours as needed for Wheezing.     . docusate sodium (COLACE) 100 MG capsule Take 100 mg by mouth 2 times daily.     . pantoprazole (PROTONIX) 40 MG tablet Take 40 mg by mouth 2 times daily.     Marland Kitchen therapeutic multivitamin-minerals (THERAGRAN-M) tablet Take 1 tablet by mouth daily.     Marland Kitchen atenolol (TENORMIN) 50 MG tablet Take 50 mg by mouth 2 times daily.     Marland Kitchen  clopidogrel (PLAVIX) 75 MG tablet Take 75 mg by mouth daily.     Marland Kitchen glipiZIDE (GLUCOTROL) 5 MG tablet Take 5 mg by mouth 2 times daily (before meals).     . metformin (GLUCOPHAGE-XR) 500 MG XR tablet Take 500 mg by mouth daily.     . budesonide-formoterol (SYMBICORT) 160-4.5 MCG/ACT AERO Inhale 2 puffs into the lungs 2 times daily.       . Loratadine (CLARITIN PO) Take 5 mg by mouth 2 times daily.     . ASPIRIN PO Take 81 mg by mouth daily.     Marland Kitchen CALCIUM PO Take 600 mg by mouth 2 times daily.       No current facility-administered medications for this visit.     Current Outpatient Prescriptions on File Prior to Visit   Medication Sig Dispense Refill   . Glucose Blood DISK 1 each by Does not apply route 2 times daily Dx: E11.9 100 each 3   . montelukast (SINGULAIR) 10 MG tablet Take 10 mg by mouth daily     . erythromycin (ROMYCIN) 5 MG/GM ophthalmic ointment Place into the left eye nightly Nightly.     .  cycloSPORINE (RESTASIS) 0.05 % ophthalmic emulsion 1 drop 2 times daily     . olopatadine (PATANASE) 0.6 % SOLN nassl soln 2 sprays by Nasal route 2 times daily     . atorvastatin (LIPITOR) 80 MG tablet Take 80 mg by mouth daily     . hydrochlorothiazide (MICROZIDE) 12.5 MG capsule Take 1 capsule by mouth every morning 30 capsule 5   . losartan (COZAAR) 100 MG tablet Take 1 tablet by mouth daily 30 tablet 5   . albuterol (PROVENTIL HFA;VENTOLIN HFA) 108 (90 BASE) MCG/ACT inhaler Inhale 2 puffs into the lungs every 6 hours as needed for Wheezing.     . docusate sodium (COLACE) 100 MG capsule Take 100 mg by mouth 2 times daily.     . pantoprazole (PROTONIX) 40 MG tablet Take 40 mg by mouth 2 times daily.     Marland Kitchen therapeutic multivitamin-minerals (THERAGRAN-M) tablet Take 1 tablet by mouth daily.     Marland Kitchen atenolol (TENORMIN) 50 MG tablet Take 50 mg by mouth 2 times daily.     . clopidogrel (PLAVIX) 75 MG tablet Take 75 mg by mouth daily.     Marland Kitchen glipiZIDE (GLUCOTROL) 5 MG tablet Take 5 mg by mouth 2 times daily (before meals).     . metformin (GLUCOPHAGE-XR) 500 MG XR tablet Take 500 mg by mouth daily.     . budesonide-formoterol (SYMBICORT) 160-4.5 MCG/ACT AERO Inhale 2 puffs into the lungs 2 times daily.       . Loratadine (CLARITIN PO) Take 5 mg by mouth 2 times daily.     . ASPIRIN PO Take 81 mg by mouth daily.     Marland Kitchen CALCIUM PO Take 600 mg by mouth 2 times daily.       No current facility-administered medications on file prior to visit.     Allergies: Pcn; Septra; Darvocet; Fentanyl; Meloxicam; Morphine; Pristiq; Ultracet; and Zanaflex  Past Medical History   Diagnosis Date   . Lipoma    . Colon polyps      with carpeting polyp in 2011   . Hyperthyroidism    . HH (hiatus hernia)    . Hypertension    . Barrett's esophagus    . Allergic rhinitis    . Osteoarthritis    . TIA (transient ischemic attack)    .  Asthma    . Obesity    . Spinal stenosis    . Aortic stenosis 01/24/2010   . Shortness of breath    . Chest pain    .  Transient cerebral ischemia    . Chronic back pain    . DVT (deep venous thrombosis) (HCC)    . Hyperlipidemia    . Hypothyroidism    . Neuropathy    . Carotid artery stenosis 06/21/2011     left   . Arthritis    . Neck pain    . Thyroid disorder    . GERD (gastroesophageal reflux disease) 01/07/2012   . High blood pressure    . Diabetes mellitus (HCC)    . Sigmoid diverticulosis    . Dysphagia    . Constipation    . Sore throat    . Hyperlipidemia    . Aortic stenosis    . High risk for colon cancer 01/07/2012       Past Surgical History   Procedure Laterality Date   . Carpal tunnel release     . Hemorrhoid surgery  1983   . Knee arthroscopy       BILATERAL   . Cataract removal     . Colonoscopy  07-12-09     Dr Beckey Downing   . Upper gastrointestinal endoscopy  12-31-08     Dr Beckey Downing   . Appendectomy     . Nasal septum surgery       repair   . Cholecystectomy     . Colonoscopy  11-06-05     Dr Beckey Downing   . Colonoscopy  12-17-03     Dr Beckey Downing   . Upper gastrointestinal endoscopy  12-21-03     Dr Beckey Downing   . Upper gastrointestinal endoscopy  10-30-05     Dr Beckey Downing   . Vascular surgery  03-20-12 [facility]     L carotid endarterectomy with Vascu-Guard patch repair. L cervical carotid arteriograms after endarterectomy   . Colonoscopy  01-14-09     Dr Beckey Downing   . Upper gastrointestinal endoscopy  12-31-08     Dr Beckey Downing   . Carotid endarterectomy  03-19-12     Dr Allyne Gee     Family History   Problem Relation Age of Onset   . Colon Cancer Mother    . Colon Cancer Brother    . Colon Cancer Brother    . Cancer Maternal Aunt    . Emphysema Father    . Heart Failure Father    . Colon Cancer Maternal Grandfather      History   Substance Use Topics   . Smoking status: Former Smoker -- 1.00 packs/day for 20 years   . Smokeless tobacco: Never Used      Comment: Stopped 1955   . Alcohol Use: No           Review of Systems    Constitutional - no significant activity change, appetite change, or unexpected weight change.  No fever or  chills.  No diaphoresis or significant fatigue.  HENT - no significant rhinorrhea or epistaxis.  No tinnitus or significant hearing loss.  Eyes - no sudden vision change or amaurosis.  Respiratory - no significant shortness of breath, wheezing, or stridor.  No apnea, cough, or chest tightness associated with shortness of breath.  Cardiovascular - no chest pain, syncope, or significant dizziness.  No palpitations or significant leg swelling.  No claudication.  Gastrointestinal - no abdominal swelling or pain.  No blood in stool.  No severe constipation, diarrhea, nausea, or vomiting.  Genitourinary - No difficulty urinating, dysuria, frequency, or urgency.  No flank pain or hematuria.  Musculoskeletal - no back pain, gait disturbance, or myalgia.  Skin - no color change, rash, pallor, or new wound.  Neurologic - no dizziness, facial asymmetry, or light headedness.  No seizures.  No speech difficulty or lateralizing weakness.  Hematologic - no easy bruising or excessive bleeding.  Psychiatric - no severe anxiety or nervousness.  No confusion.  All other review of systems are negative.    Physical Exam    Filed Vitals:    07/22/14 1423 07/22/14 1428   BP: 170/78 170/74   Pulse: 59 59   Temp: 96.8 F (36 C)    Resp: 18          Constitutional - well developed, well nourished.  No diaphoresis or acute distress.  HENT - head normocephalic.  Right external ear canal appears normal.  Left external ear canal appears normal.  Septum appears midline.  Eyes - conjunctiva normal.  EOM'S normal.  No exudate.  No icterus.  Neck- ROM appears normal, no tracheal deviation.  Cardiovascular - Regular rate and rhythm.  Heart sounds are normal.  No murmur, rub, or gallop.  Carotid pulses are 2+ to palpation bilaterally without bruit.   Pulmonary - effort appears normal.  No respiratory distress.    Lungs - Breath sounds normal. No wheezes or rales.    Extremities - Radial and brachial pulses are 2+ to palpation bilaterally.     Neurologic - alert and oriented X 3.  Physiologic. Tongue midline.  Face symmetric.  Skin - warm, dry, and intact.  No rash, erythema, or pallor.  Psychiatric - mood, affect, and behavior appear normal.  Judgment and thought processes appear normal.    Risk factors for atherosclerosis of all vascular beds have been reviewed with the patient including:  Family history, tobacco abuse in all forms, elevated cholesterol, hyperlipidemia, and diabetes.    Doppler results:    Right CCA/ICA <50% stenotic  Left CCA/ICA widely patent  Right vertebral artery flow is antegrade  Left vertebral artery flow is antegrade  Individual velocities reviewed: Yes.  Test results were reviewed with the patient.  Disease process is stable      Options have been discussed with the patient including continued medical management.  Patient has opted to proceed with continued medical management.    Assessment    1. Bilateral carotid artery stenosis          Plan    Start/Continue ASA EC 81 mg daily  Follow up in  12  Month(s)  Strongly encourage start/continue statin therapy  Recommend no smoking  Patient instructed to call or proceed to the emergency room with any symptoms of lateralizing weakness, loss of vision in one eye, or episodes slurred speech.

## 2014-07-27 ENCOUNTER — Encounter: Attending: Nurse Practitioner | Primary: Internal Medicine

## 2014-07-27 ENCOUNTER — Inpatient Hospital Stay: Admit: 2014-07-27 | Payer: MEDICARE | Primary: Internal Medicine

## 2014-07-27 ENCOUNTER — Inpatient Hospital Stay: Payer: MEDICARE | Primary: Internal Medicine

## 2014-07-27 NOTE — Telephone Encounter (Signed)
Pt has questions about getting her stress completed. This is the 2nd time she has not been able to complete test due to a descrepancey of which medications to take. Please call and clarify. [phone]

## 2014-07-27 NOTE — Telephone Encounter (Signed)
Spoke with patient regarding lexi.  She will take her medications prior to her next scheduled test.  Also discussed ways to decrease her stress level at home.

## 2014-07-27 NOTE — Telephone Encounter (Signed)
Pt is concerned about her BP continuing to be elevated.  She said she is taking the "newly prescribed" clonidine 0.1 mg as instructed by MDL.  She said she has circumstances at home that make it stressful and could be contributing to her elevation in BP.  Informed her Dr Wende Bushy nurse would be calling her back to discuss.  She voiced understanding.  Medications verified.

## 2014-07-30 NOTE — Telephone Encounter (Signed)
Patient called in wanting to talk to [facility].  I called back and explained penny was seeing patients.  She voiced understanding.

## 2014-08-03 ENCOUNTER — Encounter

## 2014-08-04 ENCOUNTER — Encounter

## 2014-08-04 MED ORDER — HYDROCHLOROTHIAZIDE 12.5 MG PO CAPS
12.5 MG | ORAL_CAPSULE | Freq: Every morning | ORAL | Status: DC
Start: 2014-08-04 — End: 2014-09-07

## 2014-08-04 MED ORDER — LOSARTAN POTASSIUM 100 MG PO TABS
100 MG | ORAL_TABLET | Freq: Every day | ORAL | Status: DC
Start: 2014-08-04 — End: 2015-01-27

## 2014-08-17 MED ORDER — ATENOLOL 50 MG PO TABS
50 MG | ORAL_TABLET | Freq: Two times a day (BID) | ORAL | Status: AC
Start: 2014-08-17 — End: ?

## 2014-08-17 NOTE — Telephone Encounter (Signed)
Pharmacy sent a refill request for atenolol.  Sent into [facility]

## 2014-08-20 ENCOUNTER — Ambulatory Visit
Admit: 2014-08-20 | Discharge: 2014-08-20 | Payer: MEDICARE | Attending: Cardiovascular Disease | Primary: Internal Medicine

## 2014-08-20 DIAGNOSIS — I10 Essential (primary) hypertension: Secondary | ICD-10-CM

## 2014-08-20 MED ORDER — CLONIDINE HCL 0.1 MG PO TABS
0.1 MG | ORAL_TABLET | Freq: Every evening | ORAL | Status: DC
Start: 2014-08-20 — End: 2015-01-13

## 2014-08-20 NOTE — Progress Notes (Signed)
The following was transcribed by [name], M.T.    [facility] [address], [address], [address], [place]  [address]    [name]  Office Visit:  08/20/2014   DOB: Jun 17, 1930, 81 y.o. female  Valerie Clark, MD     Chief Complaint   Patient presents with   . Follow-up     follow up on lexi and echo. patient states under alot of stress. Dr Andrey Campanile follows lipids.        HISTORY OF PRESENT ILLNESS  Ms. Valerie Nicholson is here for a blood pressure check.  At home it appears to be doing well without angina, overt heart failure or syncope.       Patient Active Problem List   Diagnosis Code   . Family history of colon cancer Z80.0   . Abdominal pain, acute, generalized R10.84   . HYPERTENSION I10   . Aortic stenosis Q25.3   . Carotid artery stenosis I65.29   . Preop cardiovascular exam Z01.810   . Barrett esophagus K22.70   . GERD (gastroesophageal reflux disease) K21.9   . History of colon polyps Z86.010   . High risk for colon cancer Z91.89   . Dysphagia R13.10   . Diabetes mellitus (HCC) E11.9   . Hyperlipidemia E78.5   . Fatigue R53.83   . Precordial pain R07.2   . Abnormal EKG R94.31   . DOE (dyspnea on exertion) R06.09       Past Medical History   Diagnosis Date   . Lipoma    . Colon polyps      with carpeting polyp in 2011   . Hyperthyroidism    . HH (hiatus hernia)    . Hypertension    . Barrett's esophagus    . Allergic rhinitis    . Osteoarthritis    . TIA (transient ischemic attack)    . Asthma    . Obesity    . Spinal stenosis    . Aortic stenosis 01/24/2010   . Shortness of breath    . Chest pain    . Transient cerebral ischemia    . Chronic back pain    . DVT (deep venous thrombosis) (HCC)    . Hyperlipidemia    . Hypothyroidism    . Neuropathy    . Carotid artery stenosis 06/21/2011     left   . Arthritis    . Neck pain    . Thyroid disorder    . GERD (gastroesophageal reflux disease) 01/07/2012   . High blood pressure    . Diabetes mellitus (HCC)    .  Sigmoid diverticulosis    . Dysphagia    . Constipation    . Sore throat    . Hyperlipidemia    . Aortic stenosis    . High risk for colon cancer 01/07/2012       Past Surgical History   Procedure Laterality Date   . Carpal tunnel release     . Hemorrhoid surgery  1983   . Knee arthroscopy       BILATERAL   . Cataract removal     . Colonoscopy  07-12-09     Dr Beckey Downing   . Upper gastrointestinal endoscopy  12-31-08     Dr Beckey Downing   . Appendectomy     . Nasal septum surgery       repair   . Cholecystectomy     .  Colonoscopy  11-06-05     Dr Beckey Downing   . Colonoscopy  12-17-03     Dr Beckey Downing   . Upper gastrointestinal endoscopy  12-21-03     Dr Beckey Downing   . Upper gastrointestinal endoscopy  10-30-05     Dr Beckey Downing   . Vascular surgery  03-20-12 [facility]     L carotid endarterectomy with Vascu-Guard patch repair. L cervical carotid arteriograms after endarterectomy   . Colonoscopy  01-14-09     Dr Beckey Downing   . Upper gastrointestinal endoscopy  12-31-08     Dr Beckey Downing   . Carotid endarterectomy  03-19-12     Dr Allyne Gee       History   Substance Use Topics   . Smoking status: Former Smoker -- 1.00 packs/day for 20 years   . Smokeless tobacco: Never Used      Comment: Stopped 1955   . Alcohol Use: No       Family History   Problem Relation Age of Onset   . Colon Cancer Mother    . Colon Cancer Brother    . Colon Cancer Brother    . Cancer Maternal Aunt    . Emphysema Father    . Heart Failure Father    . Colon Cancer Maternal Grandfather        BP Readings from Last 3 Encounters:   08/20/14 130/80   07/22/14 170/74   07/06/14 132/96    Pulse Readings from Last 3 Encounters:   08/20/14 68   07/22/14 59   07/06/14 58        [Allergies/Contraindications:  Pcn; Septra; Darvocet; Fentanyl; Meloxicam; Morphine; Pristiq; Ultracet; and Zanaflex]     Outpatient Prescriptions Marked as Taking for the 08/20/14 encounter (Office Visit) with Valerie Clark, MD   Medication Sig Dispense Refill   . cloNIDine (CATAPRES) 0.1 MG tablet  Take 1 tablet by mouth every evening 90 tablet 5   . atenolol (TENORMIN) 50 MG tablet Take 1 tablet by mouth 2 times daily 180 tablet 3   . losartan (COZAAR) 100 MG tablet Take 1 tablet by mouth daily 30 tablet 5   . hydrochlorothiazide (MICROZIDE) 12.5 MG capsule Take 1 capsule by mouth every morning 30 capsule 5   . Glucose Blood DISK 1 each by Does not apply route 2 times daily Dx: E11.9 100 each 3   . montelukast (SINGULAIR) 10 MG tablet Take 10 mg by mouth daily     . erythromycin (ROMYCIN) 5 MG/GM ophthalmic ointment Place into the left eye nightly Nightly.     . cycloSPORINE (RESTASIS) 0.05 % ophthalmic emulsion 1 drop 2 times daily     . olopatadine (PATANASE) 0.6 % SOLN nassl soln 2 sprays by Nasal route 2 times daily     . atorvastatin (LIPITOR) 80 MG tablet Take 80 mg by mouth daily     . docusate sodium (COLACE) 100 MG capsule Take 100 mg by mouth 2 times daily.     . pantoprazole (PROTONIX) 40 MG tablet Take 40 mg by mouth 2 times daily.     Marland Kitchen therapeutic multivitamin-minerals (THERAGRAN-M) tablet Take 1 tablet by mouth daily.     . clopidogrel (PLAVIX) 75 MG tablet Take 75 mg by mouth daily.     Marland Kitchen glipiZIDE (GLUCOTROL) 5 MG tablet Take 5 mg by mouth 2 times daily (before meals).     . metformin (GLUCOPHAGE-XR) 500 MG XR tablet Take 500 mg by mouth daily.     Marland Kitchen  budesonide-formoterol (SYMBICORT) 160-4.5 MCG/ACT AERO Inhale 2 puffs into the lungs 2 times daily.       . Loratadine (CLARITIN PO) Take 5 mg by mouth 2 times daily.     . ASPIRIN PO Take 81 mg by mouth daily.     Marland Kitchen CALCIUM PO Take 600 mg by mouth 2 times daily.       REVIEW OF SYSTEMS  Constitutional:  No fevers, chills, night sweats, or weight change.  HENT:  Negative for nosebleeds, facial swelling, rhinorrhea and neck stiffness.  RESPIRATORY:  No shortness of breath, cough, or sputum production.  No wheezing or stridor.   CARDIOVASCULAR:  There is no angina, no overt heart failure, and no syncope.  GASTROINTESTINAL:   Negative for abdominal  distention.  GENITOURINARY:  Negative for dysuria, urgency and frequency.  MUSCULOSKELETAL:   Negative for myalgia, arthralgia and gait problem.  SKIN:  Negative for color change, pallor, rash and wound.  NEUROLOGICAL:   Negative for dizziness, syncope and lightheadedness.    HEMATOLOGICAL:   Does not bruise or bleed easily.  PSYCHIATRIC/BEHAVIORAL:   No excessive anxiety or confusion.       PHYSICAL EXAMINATION  GENERAL:  Alert and oriented x3 in no apparent distress.  Short-term and long-term memory intact.  Judgment intact.  Oriented to time, place and person.  No depression, anxiety or agitation.  VITAL SIGNS:  BP 130/80 mmHg  Pulse 68  Ht 5\' 2"  (1.575 m)  Wt 209 lb (94.802 kg)  BMI 38.22 kg/m2   HEAD:  Normocephalic without evidence of old or recent trauma.  EARS:  Tympanic membranes not visualized.    EYES:  Sclerae clear.  Conjunctivae pink.  EOMs intact.  Pupils equal and round.  NOSE:  Negative nasal discharge or epistaxis.  MOUTH:  Teeth, gums and palate normal.   THROAT:  No lesions on lips or buccal mucosa.  Tongue protrudes in midline and is well papillated.  NECK:  Supple without mass or JVD.  Carotid pulses 2+ to palpation bilaterally without bruit.  No thyromegaly noted.    CHEST:  Equal bilateral expansion.  RESPIRATORY:  Clear to auscultation and percussion.  Normal respiratory effort.    CARDIOVASCULAR:  The heart's rhythm is regular.  No cardiac murmur heard upon auscultation.      ABDOMEN:  Soft, nontender.  Bowel sounds are normal.  No hepatosplenomegaly or palpable mass.  UPPER EXTREMITY EVALUATION:  Radial pulses palpable bilaterally.  No cyanosis, clubbing or edema.   LOWER EXTREMITY EVALUATION:  Femoral, popliteal, dorsalis pedis, and posterior tibialis pulses 2+ to palpation bilaterally.  No cyanosis, clubbing, or peripheral edema.    SKIN:  Warm and dry.     GASTROINTESTINAL:  Stool sample not pertinent.  MUSCULOSKELETAL:  Normal muscle strength and tone.  No atrophy or  abnormalities.  SKIN:  Warm, dry, intact.  No dermatitis or ulcers.  NEUROLOGIC:  Cranial nerves II through XII are grossly intact.      IMPRESSION  1.  Improved status.        ICD-10-CM ICD-9-CM    1. Essential hypertension I10 401.9         PLAN  1.  We will see her back in three months.    2.  Continue same medications.        Judith Blonder. Chase Picket, M.D., Ph.D., F.A.C.C.  [facility]     cc (pcp): Belinda Block, MD

## 2014-09-07 ENCOUNTER — Encounter

## 2014-09-07 MED ORDER — HYDROCHLOROTHIAZIDE 12.5 MG PO CAPS
12.5 MG | ORAL_CAPSULE | Freq: Every morning | ORAL | Status: DC
Start: 2014-09-07 — End: 2015-03-04

## 2014-09-08 NOTE — Telephone Encounter (Signed)
Error

## 2014-09-15 ENCOUNTER — Ambulatory Visit: Admit: 2014-09-15 | Discharge: 2014-09-15 | Payer: MEDICARE | Attending: Family | Primary: Internal Medicine

## 2014-09-15 DIAGNOSIS — K5909 Other constipation: Secondary | ICD-10-CM

## 2014-09-15 MED ORDER — LACTULOSE 10 GM/15ML PO SOLN
10 GM/15ML | Freq: Two times a day (BID) | ORAL | Status: AC
Start: 2014-09-15 — End: 2014-10-15

## 2014-09-15 NOTE — Patient Instructions (Signed)
Constipation: Care Instructions  Your Care Instructions  Constipation means that you have a hard time passing stools (bowel movements). People pass stools from 3 times a day to once every 3 days. What is normal for you may be different. Constipation may occur with pain in the rectum and cramping. The pain may get worse when you try to pass stools. Sometimes there are small amounts of bright red blood on toilet paper or the surface of stools. This is because of enlarged veins near the rectum (hemorrhoids).  A few changes in your diet and lifestyle may help you avoid ongoing constipation. Your doctor may also prescribe medicine to help loosen your stool.  Some medicines can cause constipation. These include pain medicines and antidepressants. Tell your doctor about all the medicines you take. Your doctor may want to make a medicine change to ease your symptoms.  Follow-up care is a key part of your treatment and safety. Be sure to make and go to all appointments, and call your doctor if you are having problems. It's also a good idea to know your test results and keep a list of the medicines you take.  How can you care for yourself at home?  ?? Drink plenty of fluids, enough so that your urine is light yellow or clear like water. If you have kidney, heart, or liver disease and have to limit fluids, talk with your doctor before you increase the amount of fluids you drink.  ?? Include high-fiber foods in your diet each day. These include fruits, vegetables, beans, and whole grains.  ?? Get at least 30 minutes of exercise on most days of the week. Walking is a good choice. You also may want to do other activities, such as running, swimming, cycling, or playing tennis or team sports.  ?? Take a fiber supplement, such as Citrucel or Metamucil, every day. Read and follow all instructions on the label.  ?? Schedule time each day for a bowel movement. A daily routine may help. Take your time having your bowel movement.  ?? Support  your feet with a small step stool when you sit on the toilet. This helps flex your hips and places your pelvis in a squatting position.  ?? Your doctor may recommend an over-the-counter laxative to relieve your constipation. Examples are Milk of Magnesia and MiraLax. Read and follow all instructions on the label. Do not use laxatives on a long-term basis.  When should you call for help?  Call your doctor now or seek immediate medical care if:  ?? You have new or worse belly pain.  ?? You have new or worse nausea or vomiting.  ?? You have blood in your stools.  Watch closely for changes in your health, and be sure to contact your doctor if:  ?? Your constipation is getting worse.  ?? You do not get better as expected.   Where can you learn more?   Go to [email or web address] and sign in to your MyChart account. Enter P343 in the Search Health Information box to learn more about ???Constipation: Care Instructions.???    If you do not have an account, please click on the ???Sign Up Now??? link.     ?? 2006-2015 [employer]. Care instructions adapted under license by [facility]. This care instruction is for use with your licensed healthcare professional. If you have questions about a medical condition or this instruction, always ask your healthcare professional. [employer], Inc[employer] disclaims any warranty or liability for your use of   this information.  Content Version: 10.6.465758; Current as of: March 13, 2013

## 2014-09-15 NOTE — Progress Notes (Signed)
Subjective:      Patient ID: Valerie Nicholson is a 81 y.o. female.  Chief Complaint   Patient presents with   . Constipation     Referred by Dr. Andrey Campanile   . Gastroesophageal Reflux     controlled, per pt     HPI  PCP: Melina Fiddler  Pt referred here for c/o:    (1) constipation, chronic, for 50+ years. Currently taking Colace 100mg  daily, also taking Dulcolax suppositories prn. Having a BM every 4-5 days on average, has to strain, and this aggravates her internal hemorrhoids.  (2) chronic heartburn well controlled with protonix daily. No further UGI complaints, other than some dysphagia with large pills, states as long as she takes one pill at a time this isnt a problem. No dysphagia with foods.      Last EGD 08/2012 [facility])- known Barretts, this exam is same; 3 yr recall  (1) ESOPHAGUS, GASTROESOPHAGEAL JUNCTION, BIOPSY:     ---SINGLE FRAGMENT OF SQUAMOCOLUMNAR GASTROESOPHAGEAL JUNCTION WITH          MODERATE CHRONIC INFLAMMATION.    ---NEGATIVE FOR INTESTINAL METAPLASIA IN THIS LIMITED BIOPSY SAMPLE.     Last colonoscopy 08/2012 [facility])- (previous hx of carpeting adenoma in ascending colon with tattoo), no regrowth at tattoo, polyp; 5 yr recall  (1) COLON, RECTAL POLYP, POLYPECTOMY:   ---TUBULAR ADENOMA.   --- NEGATIVE FOR HIGH-GRADE DYSPLASIA.      Family HX: Mother, brother, maternal grandfather had colon cancer.    Past Medical History   Diagnosis Date   . Lipoma    . Colon polyps      with carpeting polyp in 2011   . Hyperthyroidism    . HH (hiatus hernia)    . Hypertension    . Barrett's esophagus    . Allergic rhinitis    . Osteoarthritis    . TIA (transient ischemic attack)    . Asthma    . Obesity    . Spinal stenosis    . Aortic stenosis 01/24/2010   . Shortness of breath    . Chest pain    . Transient cerebral ischemia    . Chronic back pain    . DVT (deep venous thrombosis) (HCC)    . Hyperlipidemia    . Hypothyroidism    . Neuropathy    . Carotid artery stenosis 06/21/2011     left   . Arthritis    .  Neck pain    . Thyroid disorder    . GERD (gastroesophageal reflux disease) 01/07/2012   . High blood pressure    . Diabetes mellitus (HCC)    . Sigmoid diverticulosis    . Dysphagia    . Constipation    . Sore throat    . Hyperlipidemia    . Aortic stenosis    . High risk for colon cancer 01/07/2012     Past Surgical History   Procedure Laterality Date   . Carpal tunnel release     . Hemorrhoid surgery  1983   . Knee arthroscopy       BILATERAL   . Cataract removal     . Colonoscopy  07-12-09     Dr Beckey Downing   . Upper gastrointestinal endoscopy  12-31-08     Dr Beckey Downing   . Appendectomy     . Nasal septum surgery       repair   . Cholecystectomy     . Colonoscopy  11-06-05  Dr Beckey Downing   . Colonoscopy  12-17-03     Dr Beckey Downing   . Upper gastrointestinal endoscopy  12-21-03     Dr Beckey Downing   . Upper gastrointestinal endoscopy  10-30-05     Dr Beckey Downing   . Vascular surgery  03-20-12 [facility]     L carotid endarterectomy with Vascu-Guard patch repair. L cervical carotid arteriograms after endarterectomy   . Colonoscopy  01-14-09     Dr Beckey Downing   . Upper gastrointestinal endoscopy  12-31-08     Dr Beckey Downing   . Carotid endarterectomy  03-19-12     Dr Allyne Gee   . Hernia repair       History     Social History   . Marital Status: Married     Spouse Name: N/A     Number of Children: N/A   . Years of Education: N/A     Social History Main Topics   . Smoking status: Never Smoker    . Smokeless tobacco: Never Used   . Alcohol Use: No   . Drug Use: No   . Sexual Activity: None     Other Topics Concern   . None     Social History Narrative       Allergies   Allergen Reactions   . Pcn [Penicillins] Swelling     Closes throat up   . Septra [Sulfamethoxazole-Trimethoprim]    . Darvocet [Propoxyphene N-Acetaminophen] Palpitations     Morphine or anesthesia from hemorrhoid surgery in 1983   . Fentanyl Nausea And Vomiting   . Meloxicam Palpitations   . Morphine Palpitations     Morphine or anesthesia from hemorrhoid surgery in 1983.    Derwood Kaplan [Desvenlafaxine] Palpitations   . Ultracet [Tramadol-Acetaminophen] Palpitations   . Zanaflex [Tizanidine Hydrochloride] Palpitations       Current Outpatient Prescriptions   Medication Sig Dispense Refill   . hydrochlorothiazide (MICROZIDE) 12.5 MG capsule Take 1 capsule by mouth every morning 30 capsule 5   . cloNIDine (CATAPRES) 0.1 MG tablet Take 1 tablet by mouth every evening 90 tablet 5   . atenolol (TENORMIN) 50 MG tablet Take 1 tablet by mouth 2 times daily 180 tablet 3   . losartan (COZAAR) 100 MG tablet Take 1 tablet by mouth daily 30 tablet 5   . montelukast (SINGULAIR) 10 MG tablet Take 10 mg by mouth daily     . erythromycin (ROMYCIN) 5 MG/GM ophthalmic ointment Place into the left eye nightly Nightly.     . cycloSPORINE (RESTASIS) 0.05 % ophthalmic emulsion 1 drop 2 times daily     . olopatadine (PATANASE) 0.6 % SOLN nassl soln 2 sprays by Nasal route 2 times daily     . atorvastatin (LIPITOR) 80 MG tablet Take 80 mg by mouth daily     . docusate sodium (COLACE) 100 MG capsule Take 100 mg by mouth 2 times daily.     . pantoprazole (PROTONIX) 40 MG tablet Take 40 mg by mouth 2 times daily.     Marland Kitchen therapeutic multivitamin-minerals (THERAGRAN-M) tablet Take 1 tablet by mouth daily.     . clopidogrel (PLAVIX) 75 MG tablet Take 75 mg by mouth daily.     Marland Kitchen glipiZIDE (GLUCOTROL) 5 MG tablet Take 5 mg by mouth 2 times daily (before meals).     . metformin (GLUCOPHAGE-XR) 500 MG XR tablet Take 500 mg by mouth daily.     . budesonide-formoterol (SYMBICORT) 160-4.5 MCG/ACT AERO Inhale 2 puffs into the  lungs 2 times daily.       . Loratadine (CLARITIN PO) Take 5 mg by mouth 2 times daily.     . ASPIRIN PO Take 81 mg by mouth daily.     Marland Kitchen CALCIUM PO Take 600 mg by mouth 2 times daily.       No current facility-administered medications for this visit.       Review of Systems   Constitutional: Positive for fatigue. Negative for fever, appetite change and unexpected weight change.   HENT: Negative for  hearing loss and sore throat.    Eyes: Negative for photophobia and visual disturbance.   Respiratory: Positive for cough and shortness of breath. Negative for choking and wheezing.    Cardiovascular: Negative for chest pain, palpitations and leg swelling.   Gastrointestinal: Positive for constipation. Negative for nausea, vomiting, abdominal pain, diarrhea, blood in stool, abdominal distention, anal bleeding and rectal pain.   Genitourinary: Negative for dysuria and hematuria.   Musculoskeletal: Positive for back pain, arthralgias and neck pain. Negative for myalgias.   Skin: Negative for rash.   Allergic/Immunologic: Negative for immunocompromised state.   Neurological: Negative for dizziness and seizures.   Hematological: Does not bruise/bleed easily.   Psychiatric/Behavioral: Positive for dysphoric mood. Negative for confusion. The patient is nervous/anxious.        Objective:   Physical Exam   Constitutional: She is oriented to person, place, and time. She appears well-developed and well-nourished.   BP 160/90 mmHg  Pulse 58  Ht 5' (1.524 m)  Wt 207 lb 3.2 oz (93.985 kg)  BMI 40.47 kg/m2     HENT:   Head: Normocephalic and atraumatic.   Eyes: Conjunctivae and EOM are normal. Pupils are equal, round, and reactive to light.   Neck: Normal range of motion. Neck supple. No thyromegaly present.   Cardiovascular: Normal rate and regular rhythm.  Exam reveals no gallop and no friction rub.    Murmur (3/6) heard.  Pulmonary/Chest: Effort normal and breath sounds normal. No respiratory distress.   Abdominal: Soft. Bowel sounds are normal. She exhibits no distension. There is no tenderness. There is no rebound.   Musculoskeletal: She exhibits edema (1+ to bil lower extremities).   Uses a walker for assistance with ambulation   Neurological: She is alert and oriented to person, place, and time. No cranial nerve deficit.   Skin: Skin is warm and dry. No pallor.   Psychiatric: She has a normal mood and affect. Judgment  normal.   Nursing note and vitals reviewed.      Assessment:      1. Chronic constipation  lactulose (CHRONULAC) 10 GM/15ML solution   2. Polypharmacy             Plan:      1. Add lactulose 1-3x daily, this was escribed  2. F/u in 6 weeks to note med efficacy of lactulose.

## 2014-09-24 ENCOUNTER — Encounter

## 2014-09-28 MED ORDER — CLOPIDOGREL BISULFATE 75 MG PO TABS
75 MG | ORAL_TABLET | Freq: Every day | ORAL | Status: DC
Start: 2014-09-28 — End: 2015-10-02

## 2014-10-11 ENCOUNTER — Encounter: Attending: Nurse Practitioner | Primary: Internal Medicine

## 2014-10-20 ENCOUNTER — Encounter: Attending: Cardiovascular Disease | Primary: Internal Medicine

## 2014-10-27 ENCOUNTER — Encounter: Attending: Family | Primary: Internal Medicine

## 2014-11-24 ENCOUNTER — Ambulatory Visit
Admit: 2014-11-24 | Discharge: 2014-11-24 | Payer: MEDICARE | Attending: Cardiovascular Disease | Primary: Internal Medicine

## 2014-11-24 ENCOUNTER — Encounter

## 2014-11-24 DIAGNOSIS — I10 Essential (primary) hypertension: Secondary | ICD-10-CM

## 2014-11-24 MED ORDER — AMLODIPINE BESYLATE 5 MG PO TABS
5 MG | ORAL_TABLET | Freq: Every day | ORAL | 3 refills | Status: DC
Start: 2014-11-24 — End: 2014-11-30

## 2014-11-24 MED ORDER — AMLODIPINE BESYLATE 5 MG PO TABS
5 MG | ORAL_TABLET | Freq: Every day | ORAL | 3 refills | Status: DC
Start: 2014-11-24 — End: 2014-11-24

## 2014-11-30 ENCOUNTER — Ambulatory Visit: Admit: 2014-11-30 | Discharge: 2014-11-30 | Payer: MEDICARE | Attending: Family | Primary: Internal Medicine

## 2014-11-30 DIAGNOSIS — K5909 Other constipation: Secondary | ICD-10-CM

## 2014-11-30 MED ORDER — LINACLOTIDE 145 MCG PO CAPS
145 MCG | ORAL_CAPSULE | Freq: Every day | ORAL | 11 refills | Status: DC
Start: 2014-11-30 — End: 2015-12-29

## 2014-11-30 NOTE — Progress Notes (Signed)
The following was transcribed by Evon Slack, M.A.    [facility] [address] [place], [address], [address], [place]  [address]    Valerie Nicholson  Office Visit:  11/24/2014   DOB: 02-Nov-1930, 81 y.o. female  Valerie Clark, MD     Chief Complaint   Patient presents with   . 3 Month Follow-Up     States she has a lot of stress and causes chest pain at times.   . Hypertension   . Hyperlipidemia        HISTORY OF PRESENT ILLNESS  Valerie Nicholson is seen here for hypertension, and aortic valve disease. Ms. Valerie Nicholson presents today for routine follow up. She's having a lot of stress at home related to her husband's dementia. She has had some chest pain, but only with severe emotional distress. She has no exertional problems. There's been no overt heart failure, no syncope, and no stroke-like problems.     Patient Active Problem List   Diagnosis Code   . Family history of colon cancer Z80.0   . Abdominal pain, acute, generalized R10.84   . Hypertension I10   . Aortic stenosis I35.0   . Carotid artery stenosis I65.29   . Preop cardiovascular exam Z01.810   . Barrett esophagus K22.70   . GERD (gastroesophageal reflux disease) K21.9   . History of colon polyps Z86.010   . High risk for colon cancer Z91.89   . Dysphagia R13.10   . Diabetes mellitus (HCC) E11.9   . Hyperlipidemia E78.5   . Fatigue R53.83   . Precordial pain R07.2   . Abnormal EKG R94.31   . DOE (dyspnea on exertion) R06.09   . Chronic constipation K59.09   . Polypharmacy Z79.899       Past Medical History   Diagnosis Date   . Allergic rhinitis    . Aortic stenosis 01/24/2010   . Aortic stenosis    . Arthritis    . Asthma    . Barrett's esophagus    . Carotid artery stenosis 06/21/2011     left   . Chest pain    . Chronic back pain    . Colon polyps      with carpeting polyp in 2011   . Constipation    . Diabetes mellitus (HCC)    . DVT (deep venous thrombosis) (HCC)    . Dysphagia    . GERD (gastroesophageal reflux  disease) 01/07/2012   . HH (hiatus hernia)    . High blood pressure    . High risk for colon cancer 01/07/2012   . Hyperlipidemia    . Hyperlipidemia    . Hypertension    . Hyperthyroidism    . Hypothyroidism    . Lipoma    . Neck pain    . Neuropathy    . Obesity    . Osteoarthritis    . Shortness of breath    . Sigmoid diverticulosis    . Sore throat    . Spinal stenosis    . Thyroid disorder    . TIA (transient ischemic attack)    . Transient cerebral ischemia        Past Surgical History   Procedure Laterality Date   . Carpal tunnel release     . Hemorrhoid surgery  1983   . Knee arthroscopy       BILATERAL   . Cataract removal     .  Colonoscopy  07-12-09     Dr Beckey Downing   . Upper gastrointestinal endoscopy  12-31-08     Dr Beckey Downing   . Appendectomy     . Nasal septum surgery       repair   . Cholecystectomy     . Colonoscopy  11-06-05     Dr Beckey Downing   . Colonoscopy  12-17-03     Dr Beckey Downing   . Upper gastrointestinal endoscopy  12-21-03     Dr Beckey Downing   . Upper gastrointestinal endoscopy  10-30-05     Dr Beckey Downing   . Vascular surgery  03-20-12 [facility]     L carotid endarterectomy with Vascu-Guard patch repair. L cervical carotid arteriograms after endarterectomy   . Colonoscopy  01-14-09     Dr Beckey Downing   . Upper gastrointestinal endoscopy  12-31-08     Dr Beckey Downing   . Carotid endarterectomy  03-19-12     Dr Allyne Gee   . Hernia repair         Social History   Substance Use Topics   . Smoking status: Never Smoker   . Smokeless tobacco: Never Used   . Alcohol use No        Family History   Problem Relation Age of Onset   . Colon Cancer Mother    . Colon Polyps Mother    . Colon Cancer Brother    . Cancer Maternal Aunt    . Emphysema Father    . Heart Failure Father    . Colon Cancer Maternal Grandfather    . Colon Cancer Brother    . Esophageal Cancer Neg Hx    . Liver Disease Neg Hx    . Liver Cancer Neg Hx    . Rectal Cancer Neg Hx    . Stomach Cancer Neg Hx        [Allergies/Contraindications:  Pcn  [penicillins]; Septra [sulfamethoxazole-trimethoprim]; Darvocet [propoxyphene n-acetaminophen]; Fentanyl; Meloxicam; Morphine; Pristiq [desvenlafaxine]; Ultracet [tramadol-acetaminophen]; and Zanaflex [tizanidine hydrochloride]]     Outpatient Prescriptions Marked as Taking for the 11/24/14 encounter (Office Visit) with Valerie Clark, MD   Medication Sig Dispense Refill   . acetaminophen (TYLENOL) 500 MG tablet Take 1,000 mg by mouth every 6 hours as needed for Pain     . amLODIPine (NORVASC) 5 MG tablet Take 1 tablet by mouth daily 90 tablet 3   . [DISCONTINUED] amLODIPine (NORVASC) 5 MG tablet Take 1 tablet by mouth daily 30 tablet 3   . clopidogrel (PLAVIX) 75 MG tablet Take 1 tablet by mouth daily 90 tablet 3   . hydrochlorothiazide (MICROZIDE) 12.5 MG capsule Take 1 capsule by mouth every morning 30 capsule 5   . cloNIDine (CATAPRES) 0.1 MG tablet Take 1 tablet by mouth every evening 90 tablet 5   . atenolol (TENORMIN) 50 MG tablet Take 1 tablet by mouth 2 times daily 180 tablet 3   . losartan (COZAAR) 100 MG tablet Take 1 tablet by mouth daily 30 tablet 5   . montelukast (SINGULAIR) 10 MG tablet Take 10 mg by mouth daily     . erythromycin (ROMYCIN) 5 MG/GM ophthalmic ointment Place into the left eye nightly Nightly.     . cycloSPORINE (RESTASIS) 0.05 % ophthalmic emulsion 1 drop 2 times daily     . olopatadine (PATANASE) 0.6 % SOLN nassl soln 2 sprays by Nasal route 2 times daily     . atorvastatin (LIPITOR) 80 MG tablet Take 80 mg by mouth daily     .  docusate sodium (COLACE) 100 MG capsule Take 100 mg by mouth 2 times daily.     . pantoprazole (PROTONIX) 40 MG tablet Take 40 mg by mouth 2 times daily.     Marland Kitchen therapeutic multivitamin-minerals (THERAGRAN-M) tablet Take 1 tablet by mouth daily.     Marland Kitchen glipiZIDE (GLUCOTROL) 5 MG tablet Take 5 mg by mouth 2 times daily (before meals).     . metformin (GLUCOPHAGE-XR) 500 MG XR tablet Take 500 mg by mouth daily.     . budesonide-formoterol (SYMBICORT) 160-4.5  MCG/ACT AERO Inhale 2 puffs into the lungs 2 times daily.       . Loratadine (CLARITIN PO) Take 5 mg by mouth 2 times daily.     . ASPIRIN PO Take 81 mg by mouth daily.     Marland Kitchen CALCIUM PO Take 600 mg by mouth 2 times daily.         REVIEW OF SYSTEMS  Constitutional:  No fevers, chills, night sweats, or weight change.  HENT:  Negative for nosebleeds, facial swelling, rhinorrhea and neck stiffness.  RESPIRATORY:  No shortness of breath, cough or sputum production.  No wheezing or stridor.   CARDIOVASCULAR:  There is no angina, no overt heart failure, and no syncope.  GASTROINTESTINAL:   Negative for abdominal distention.  GENITOURINARY:  Negative for dysuria, urgency and frequency.  MUSCULOSKELETAL:   Negative for myalgia, arthralgia and gait problem.  SKIN:  Negative for color change, pallor, rash and wound.  NEUROLOGICAL:   Negative for dizziness, syncope and lightheadedness.    HEMATOLOGICAL:   Does not bruise or bleed easily.  PSYCHIATRIC/BEHAVIORAL:   No excessive anxiety or confusion.       PHYSICAL EXAMINATION  GENERAL:  Alert and oriented x3 in no apparent distress.  Short-term and long-term memory intact.  Judgment intact.  Oriented to time, place and person.  No depression, anxiety or agitation.  VITAL SIGNS:  BP (!) 210/90  Pulse 64  Ht 5\' 1"  (1.549 m)  Wt 189 lb (85.7 kg)  BMI 35.71 kg/m2   HEAD:  Normocephalic without evidence of old or recent trauma.  EARS:  Tympanic membranes not visualized.    EYES:  Sclerae clear.  Conjunctivae pink.  EOMs intact.  Pupils equal and round.  NOSE:  Negative nasal discharge or epistaxis.  MOUTH:  Teeth, gums and palate normal.   THROAT:  No lesions on lips or buccal mucosa.  Tongue protrudes in midline and is well papillated.  NECK:  Supple without mass or JVD.  Carotid pulses 2+ to palpation bilaterally without bruit.  No thyromegaly noted.    CHEST:  Equal bilateral expansion.  RESPIRATORY:  Clear to auscultation and percussion.  Normal respiratory effort.     CARDIOVASCULAR:  One to two over six systolic ejection murmur right along the sternal border.       ABDOMEN:  Soft, nontender.  Bowel sounds are normal.  No hepatosplenomegaly or palpable mass.  UPPER EXTREMITY EVALUATION:  Radial pulses palpable bilaterally.  No cyanosis, clubbing or edema.   LOWER EXTREMITY EVALUATION:  Femoral, popliteal, dorsalis pedis, and posterior tibialis pulses 2+ to palpation bilaterally.  No cyanosis, clubbing, or peripheral edema.    SKIN:  Warm and dry.     GASTROINTESTINAL:  Stool sample not pertinent.  MUSCULOSKELETAL:  Normal muscle strength and tone.  No atrophy or abnormalities.  SKIN:  Warm, dry, intact.  No dermatitis or ulcers.  NEUROLOGIC:  Cranial nerves II through XII are grossly intact.  IMPRESSION  1.  Aortic stenosis without symptoms of heart failure.   2.  Hypertension which appears to be poorly controlled.     PLAN  1.  Start Norvasc 5 mg daily.   2.  Blood pressure check in 2 weeks.     Orders Placed This Encounter   Medications   . DISCONTD: amLODIPine (NORVASC) 5 MG tablet     Sig: Take 1 tablet by mouth daily     Dispense:  30 tablet     Refill:  3       Farris Blash D. Chase Picket, M.D., Ph.D., F.A.C.C.  [facility]     cc (pcp): Belinda Block, MD

## 2014-11-30 NOTE — Progress Notes (Signed)
Subjective:      Patient ID: Valerie Nicholson is a 81 y.o. female.  Chief Complaint   Patient presents with   . Medication Check     Pt is here today for a 6 wk follow up for medication efficency of lactulose.        HPI  PCP: Melina Fiddler  Pt here for f/u to discuss med efficacy of lactulose for chronic constipation. Took it as directed 3x daily, caused diarrhea with fecal incontinence so she decreased the dose to 1x daily, still had fecal urgency and diarrhea so she had to stop taking it after trying it for about 4 weeks.    OV note 08/2014:  Pt referred here for c/o:   (1) constipation, chronic, for 50+ years. Currently taking Colace 100mg  daily, also taking Dulcolax suppositories prn. Having a BM every 4-5 days on average, has to strain, and this aggravates her internal hemorrhoids.  (2) chronic heartburn well controlled with protonix daily. No further UGI complaints, other than some dysphagia with large pills, states as long as she takes one pill at a time this isnt a problem. No dysphagia with foods.      Last EGD 08/2012 [facility] known Barretts, this exam is same; 3 yr recall  (1) ESOPHAGUS, GASTROESOPHAGEAL JUNCTION, BIOPSY:   ---SINGLE FRAGMENT OF SQUAMOCOLUMNAR GASTROESOPHAGEAL JUNCTION WITH     MODERATE CHRONIC INFLAMMATION.   ---NEGATIVE FOR INTESTINAL METAPLASIA IN THIS LIMITED BIOPSY SAMPLE.     Last colonoscopy 08/2012 [facility])- (previous hx of carpeting adenoma in ascending colon with tattoo), no regrowth at tattoo, polyp; 5 yr recall  (1) COLON, RECTAL POLYP, POLYPECTOMY:  ---TUBULAR ADENOMA.   ---NEGATIVE FOR HIGH-GRADE DYSPLASIA.      Family HX: Mother, brother, maternal grandfather had colon cancer.    Past Medical History   Diagnosis Date   . Allergic rhinitis    . Anxiety    . Aortic stenosis 01/24/2010   . Aortic stenosis    . Arthritis    . Asthma    . Barrett's esophagus    . Carotid artery stenosis 06/21/2011     left   . Chest pain    . Chronic back pain    . Colon polyps       with carpeting polyp in 2011   . Constipation    . Depression    . Diabetes mellitus (HCC)    . DVT (deep venous thrombosis) (HCC)    . Dysphagia    . GERD (gastroesophageal reflux disease) 01/07/2012   . HH (hiatus hernia)    . High blood pressure    . High risk for colon cancer 01/07/2012   . Hyperlipidemia    . Hyperlipidemia    . Hypertension    . Hyperthyroidism    . Hypothyroidism    . Lipoma    . Neck pain    . Neuropathy    . Obesity    . Osteoarthritis    . Shortness of breath    . Sigmoid diverticulosis    . Sore throat    . Spinal stenosis    . Thyroid disorder    . TIA (transient ischemic attack)    . Transient cerebral ischemia      Past Surgical History   Procedure Laterality Date   . Carpal tunnel release     . Hemorrhoid surgery  1983   . Knee arthroscopy       BILATERAL   . Cataract removal     .  Colonoscopy  07-12-09     Dr Beckey Downing   . Upper gastrointestinal endoscopy  12-31-08     Dr Beckey Downing   . Appendectomy     . Nasal septum surgery       repair   . Cholecystectomy     . Colonoscopy  11-06-05     Dr Beckey Downing   . Colonoscopy  12-17-03     Dr Beckey Downing   . Upper gastrointestinal endoscopy  12-21-03     Dr Beckey Downing   . Upper gastrointestinal endoscopy  10-30-05     Dr Beckey Downing   . Vascular surgery  03-20-12 [facility]     L carotid endarterectomy with Vascu-Guard patch repair. L cervical carotid arteriograms after endarterectomy   . Colonoscopy  01-14-09     Dr Beckey Downing   . Upper gastrointestinal endoscopy  12-31-08     Dr Beckey Downing   . Carotid endarterectomy  03-19-12     Dr Allyne Gee   . Hernia repair       Social History     Social History   . Marital status: Married     Spouse name: N/A   . Number of children: N/A   . Years of education: N/A     Social History Main Topics   . Smoking status: Never Smoker   . Smokeless tobacco: Never Used   . Alcohol use No   . Drug use: No   . Sexual activity: Not Asked     Other Topics Concern   . None     Social History Narrative       Allergies   Allergen  Reactions   . Pcn [Penicillins] Swelling     Closes throat up   . Septra [Sulfamethoxazole-Trimethoprim]    . Darvocet [Propoxyphene N-Acetaminophen] Palpitations     Morphine or anesthesia from hemorrhoid surgery in 1983   . Fentanyl Nausea And Vomiting   . Meloxicam Palpitations   . Morphine Palpitations     Morphine or anesthesia from hemorrhoid surgery in 1983.   Derwood Kaplan [Desvenlafaxine] Palpitations   . Ultracet [Tramadol-Acetaminophen] Palpitations   . Zanaflex [Tizanidine Hydrochloride] Palpitations       Current Outpatient Prescriptions   Medication Sig Dispense Refill   . acetaminophen (TYLENOL) 500 MG tablet Take 1,000 mg by mouth every 6 hours as needed for Pain     . clopidogrel (PLAVIX) 75 MG tablet Take 1 tablet by mouth daily 90 tablet 3   . hydrochlorothiazide (MICROZIDE) 12.5 MG capsule Take 1 capsule by mouth every morning 30 capsule 5   . cloNIDine (CATAPRES) 0.1 MG tablet Take 1 tablet by mouth every evening 90 tablet 5   . atenolol (TENORMIN) 50 MG tablet Take 1 tablet by mouth 2 times daily 180 tablet 3   . losartan (COZAAR) 100 MG tablet Take 1 tablet by mouth daily 30 tablet 5   . montelukast (SINGULAIR) 10 MG tablet Take 10 mg by mouth daily     . erythromycin (ROMYCIN) 5 MG/GM ophthalmic ointment Place into the left eye nightly Nightly.     . cycloSPORINE (RESTASIS) 0.05 % ophthalmic emulsion 1 drop 2 times daily     . olopatadine (PATANASE) 0.6 % SOLN nassl soln 2 sprays by Nasal route 2 times daily     . atorvastatin (LIPITOR) 80 MG tablet Take 80 mg by mouth daily     . docusate sodium (COLACE) 100 MG capsule Take 100 mg by mouth 2 times daily.     Marland Kitchen  pantoprazole (PROTONIX) 40 MG tablet Take 40 mg by mouth 2 times daily.     Marland Kitchen therapeutic multivitamin-minerals (THERAGRAN-M) tablet Take 1 tablet by mouth daily.     Marland Kitchen glipiZIDE (GLUCOTROL) 5 MG tablet Take 5 mg by mouth 2 times daily (before meals).     . metformin (GLUCOPHAGE-XR) 500 MG XR tablet Take 500 mg by mouth daily.     .  budesonide-formoterol (SYMBICORT) 160-4.5 MCG/ACT AERO Inhale 2 puffs into the lungs 2 times daily.       . Loratadine (CLARITIN PO) Take 5 mg by mouth 2 times daily.     . ASPIRIN PO Take 81 mg by mouth daily.     Marland Kitchen CALCIUM PO Take 600 mg by mouth 2 times daily.       No current facility-administered medications for this visit.        Review of Systems   Constitutional: Positive for fatigue. Negative for appetite change, fever and unexpected weight change.   HENT: Negative for hearing loss and sore throat.    Eyes: Negative for photophobia and visual disturbance.   Respiratory: Positive for cough and shortness of breath. Negative for choking and wheezing.    Cardiovascular: Negative for chest pain, palpitations and leg swelling.   Gastrointestinal: Positive for constipation. Negative for abdominal distention, abdominal pain, anal bleeding, blood in stool, diarrhea, nausea, rectal pain and vomiting.   Genitourinary: Negative for dysuria and hematuria.   Musculoskeletal: Positive for arthralgias, back pain and neck pain. Negative for myalgias.   Skin: Negative for rash.   Allergic/Immunologic: Negative for immunocompromised state.   Neurological: Negative for dizziness and seizures.   Hematological: Does not bruise/bleed easily.   Psychiatric/Behavioral: Positive for dysphoric mood. Negative for confusion. The patient is nervous/anxious.        Objective:   Physical Exam   Constitutional: She is oriented to person, place, and time. She appears well-developed and well-nourished.   BP 138/72 (Site: Left Arm, Position: Sitting, Cuff Size: Medium Adult)  Pulse 80  Resp 18  Ht 5' (1.524 m)  Wt 204 lb 9.6 oz (92.8 kg)  BMI 39.96 kg/m2       HENT:   Head: Normocephalic and atraumatic.   Eyes: Conjunctivae and EOM are normal. Pupils are equal, round, and reactive to light.   Neck: Normal range of motion. Neck supple. No thyromegaly present.   Cardiovascular: Normal rate and regular rhythm.  Exam reveals no gallop and no  friction rub.    Murmur (3/6) heard.  Pulmonary/Chest: Effort normal and breath sounds normal. No respiratory distress.   Abdominal: Soft. Bowel sounds are normal. She exhibits no distension. There is no tenderness. There is no rebound.   Musculoskeletal: She exhibits edema (1+ to bil lower extremities).   Uses a walker for assistance with ambulation   Neurological: She is alert and oriented to person, place, and time. No cranial nerve deficit.   Skin: Skin is warm and dry. No pallor.   Psychiatric: She has a normal mood and affect. Judgment normal.   Nursing note and vitals reviewed.      Assessment:      1. Chronic constipation  linaclotide (LINZESS) 145 MCG capsule           Plan:      1. Stop Lactulose. Start Linzess daily, #30 with 11 refills escribed. Samples x4 boxes provided to pt  2. F/u in 6 weeks to note med efficacy of linzess. If not effective we can  increase dose or try amitiza.

## 2014-11-30 NOTE — Patient Instructions (Addendum)
linaclotide  Pronunciation:  LIN a KLOE tide  Brand:  Linzess  What is linaclotide?  Linaclotide works by increasing the secretion of chloride and water in the intestines, which can soften stools and stimulate bowel movements.  Linaclotide is used to treat chronic constipation, or chronic irritable bowel syndrome (IBS) in people who have had constipation as the main symptom.  Linaclotide may also be used for purposes not listed in this medication guide.  What should I discuss with my healthcare provider before taking linaclotide?  You should not use linaclotide if you are allergic to it, or if you have:   a blockage in your stomach or intestines.  Linaclotide should not be given to a child younger than 70 years old. Do not give this medicine to any child or teenager without the advice of a doctor.  FDA pregnancy category C. It is not known whether linaclotide will harm an unborn baby. Tell your doctor if you are pregnant or plan to become pregnant while using this medicine.  It is not known whether linaclotide passes into breast milk or if it could harm a nursing baby. Tell your doctor if you are breast-feeding a baby.  How should I take linaclotide?  Follow the directions on your prescription label. Do not take this medicine in larger or smaller amounts or for longer than recommended.  Do not crush, chew, break, or open a linaclotide capsule. Swallow it whole.  Take linaclotide in the morning on an empty stomach, at least 30 minutes before your first meal.  It may take up to 2 weeks before your symptoms improve. Keep using the medication as directed. Call your doctor if your symptoms do not improve, or if they get worse while using linaclotide.  Store at room temperature away from moisture and heat.  Keep the capsules in their original container, along with the packet of moisture-absorbing preservative that comes with this medicine. Keep the bottle tightly closed when not in use.  What happens if I miss a  dose?  Take the missed dose as soon as you remember. Skip the missed dose if it is almost time for your next scheduled dose. Do not take extra medicine to make up the missed dose.  What happens if I overdose?  Seek emergency medical attention or call the Poison Help line at [phone].  Overdose symptoms may include severe forms of some of the side effects listed in this medication guide.  What should I avoid while taking linaclotide?  Follow your doctor's instructions about any restrictions on food, beverages, or activity.  What are the possible side effects of linaclotide?  Get emergency medical help if you have any of these signs of an allergic reaction: hives; difficult breathing; swelling of your face, lips, tongue, or throat.  Stop using linaclotide and call your doctor at once if you have:   severe or ongoing diarrhea;   severe stomach pain; or   black, bloody, or tarry stools.  Common side effects may include:   stomach pain;   gas; or   bloating or full feeling in your stomach.  This is not a complete list of side effects and others may occur. Call your doctor for medical advice about side effects. You may report side effects to FDA at 1-800-FDA-1088.  What other drugs will affect linaclotide?  Other drugs may interact with linaclotide, including prescription, over-the-counter, vitamin, and herbal products. Tell your doctor about all medications you use, and those you start or stop using during  your treatment with linaclotide.  Where can I get more information?  Your pharmacist can provide more information about linaclotide.    Remember, keep this and all other medicines out of the reach of children, never share your medicines with others, and use this medication only for the indication prescribed.   Every effort has been made to ensure that the information provided by [employer] ('[employer]') is accurate, up-to-date, and complete, but no guarantee is made to that effect. Drug information  contained herein may be time sensitive. [employer] information has been compiled for use by healthcare practitioners and consumers in the [place] and therefore [employer] does not warrant that uses outside of the [place] are appropriate, unless specifically indicated otherwise. [employer]'s drug information does not endorse drugs, diagnose patients or recommend therapy. [employer]'s drug information is an [employer] corporate to assist licensed healthcare practitioners in caring for their patients and/or to serve consumers viewing this service as a supplement to, and not a substitute for, the expertise, skill, knowledge and judgment of healthcare practitioners. The absence of a warning for a given drug or drug combination in no way should be construed to indicate that the drug or drug combination is safe, effective or appropriate for any given patient. [employer] does not assume any responsibility for any aspect of healthcare administered with the aid of information [employer] provides. The information contained herein is not intended to cover all possible uses, directions, precautions, warnings, drug interactions, allergic reactions, or adverse effects. If you have questions about the drugs you are taking, check with your doctor, nurse or pharmacist.  Copyright 860 319 2181 [employer] Version: 2.01. Revision date: 01/02/2013.  This information does not replace the advice of a doctor. [employer] disclaims any warranty or liability for your use of this information.   Content Version: 10.8.513193

## 2014-12-08 ENCOUNTER — Ambulatory Visit
Admit: 2014-12-08 | Discharge: 2014-12-08 | Payer: MEDICARE | Attending: Clinical Nurse Specialist | Primary: Internal Medicine

## 2014-12-08 DIAGNOSIS — I10 Essential (primary) hypertension: Secondary | ICD-10-CM

## 2014-12-08 NOTE — Patient Instructions (Addendum)
Followup With Dr.Lineberry 6 mo  Continue amlodipine  Call with any questions or concerns  Follow up with Belinda Block, MD for non cardiac problems  Report any new problems  Cardiovascular Fitness-Exercise as tolerated.  Strive for 15 minutes of exercise most days of the week.    Cardiac / Healthy Diet  Continue current medications as directed  Continue plan of treatment  It is always recommended that you bring your medications bottles with you to each visit - this is for your safety!     High Blood Pressure: Care Instructions  Your Care Instructions  If your blood pressure is usually above 140/90, you have high blood pressure, or hypertension. Despite what a lot of people think, high blood pressure usually doesn't cause headaches or make you feel dizzy or lightheaded. It usually has no symptoms. But it does increase your risk for heart attack, stroke, and kidney or eye damage. The higher your blood pressure, the more your risk increases.  Your doctor will give you a goal for your blood pressure. Your goal will be based on your health and your age. An example of a goal is to keep your blood pressure below 140/90.  Lifestyle changes, such as eating healthy and being active, are always important to help lower blood pressure. You might also take medicine to reach your blood pressure goal.  Follow-up care is a key part of your treatment and safety. Be sure to make and go to all appointments, and call your doctor if you are having problems. It's also a good idea to know your test results and keep a list of the medicines you take.  How can you care for yourself at home?  Medical treatment   If you stop taking your medicine, your blood pressure will go back up. You may take one or more types of medicine to lower your blood pressure. Be safe with medicines. Take your medicine exactly as prescribed. Call your doctor if you think you are having a problem with your medicine.   Talk to your doctor before you start taking  aspirin every day. Aspirin can help certain people lower their risk of a heart attack or stroke. But taking aspirin isn't right for everyone, because it can cause serious bleeding.   See your doctor regularly. You may need to see the doctor more often at first or until your blood pressure comes down.   If you are taking blood pressure medicine, talk to your doctor before you take decongestants or anti-inflammatory medicine, such as ibuprofen. Some of these medicines can raise blood pressure.   Learn how to check your blood pressure at home.  Lifestyle changes   Stay at a healthy weight. This is especially important if you put on weight around the waist. Losing even 10 pounds can help you lower your blood pressure.   If your doctor recommends it, get more exercise. Walking is a good choice. Bit by bit, increase the amount you walk every day. Try for at least 30 minutes on most days of the week. You also may want to swim, bike, or do other activities.   Avoid or limit alcohol. Talk to your doctor about whether you can drink any alcohol.   Try to limit how much sodium you eat to less than 2,300 milligrams (mg) a day. Your doctor may ask you to try to eat less than 1,500 mg a day.   Eat plenty of fruits (such as bananas and oranges), vegetables, legumes, whole  grains, and low-fat dairy products.   Lower the amount of saturated fat in your diet. Saturated fat is found in animal products such as milk, cheese, and meat. Limiting these foods may help you lose weight and also lower your risk for heart disease.   Do not smoke. Smoking increases your risk for heart attack and stroke. If you need help quitting, talk to your doctor about stop-smoking programs and medicines. These can increase your chances of quitting for good.  When should you call for help?  Call your doctor now or seek immediate medical care if:   Your blood pressure is much higher than normal (such as 180/110 or higher).   You think high blood  pressure is causing symptoms such as:   Severe headache.   Blurry vision.  Watch closely for changes in your health, and be sure to contact your doctor if:   You do not get better as expected.   Where can you learn more?   Go to [email or web address] and sign in to your MyChart account. Enter [phone] in the Search Health Information box to learn more about "High Blood Pressure: Care Instructions."    If you do not have an account, please click on the "Sign Up Now" link.    2006-2016 [employer]. Care instructions adapted under license by [facility]. This care instruction is for use with your licensed healthcare professional. If you have questions about a medical condition or this instruction, always ask your healthcare professional. Healthwise, Incorporated disclaims any warranty or liability for your use of this information.  Content Version: 10.8.513193; Current as of: March 12, 2014

## 2014-12-08 NOTE — Progress Notes (Signed)
[facility], [place]  [address], [place]  [address]  Phone: [phone]  Fax: [phone]    OFFICE VISIT:  12/08/2014    Valerie Nicholson - DOB: 01/11/1931    Reason For Visit:  Valerie Nicholson is a 81 y.o. female who is here for Follow-up - 2 week folllow up with NP for HTN. and Hypertension    HPI  Pt is here for follow-up for hypertension.  Amlodipine was added last visit.  She is under increased stress at home with her husband who has dementia  Hypertension   This is a recurrent problem. The current episode started more than 1 month ago. The problem has been resolved since onset. The problem is controlled. Associated symptoms include anxiety and malaise/fatigue. Pertinent negatives include no blurred vision, chest pain, headaches, neck pain, orthopnea, palpitations, peripheral edema, PND, shortness of breath or sweats. Past treatments include ACE inhibitors, beta blockers and calcium channel blockers. The current treatment provides significant improvement.       Belinda Block, MD is PCP.  Valerie Nicholson has the following history as recorded in EpicCare:    Patient Active Problem List    Diagnosis Date Noted   . Chronic constipation 09/15/2014   . Polypharmacy 09/15/2014   . Precordial pain 07/06/2014   . Abnormal EKG 07/06/2014   . DOE (dyspnea on exertion) 07/06/2014   . Fatigue 04/15/2014   . Hyperlipidemia    . Diabetes mellitus (HCC) 07/07/2012   . Barrett esophagus 01/07/2012   . GERD (gastroesophageal reflux disease) 01/07/2012   . History of colon polyps 01/07/2012   . High risk for colon cancer 01/07/2012   . Dysphagia 01/07/2012   . Preop cardiovascular exam 07/04/2011   . Carotid artery stenosis 06/21/2011   . Aortic stenosis 01/24/2010   . Hypertension    . Family history of colon cancer 12/16/2009   . Abdominal pain, acute, generalized 12/16/2009     Past Medical History   Diagnosis Date   . Allergic rhinitis    . Anxiety    . Aortic stenosis 01/24/2010   . Aortic  stenosis    . Arthritis    . Asthma    . Barrett's esophagus    . Carotid artery stenosis 06/21/2011     left   . Chest pain    . Chronic back pain    . Colon polyps      with carpeting polyp in 2011   . Constipation    . Depression    . Diabetes mellitus (HCC)    . DVT (deep venous thrombosis) (HCC)    . Dysphagia    . GERD (gastroesophageal reflux disease) 01/07/2012   . HH (hiatus hernia)    . High blood pressure    . High risk for colon cancer 01/07/2012   . Hyperlipidemia    . Hyperlipidemia    . Hypertension    . Hyperthyroidism    . Hypothyroidism    . Lipoma    . Neck pain    . Neuropathy    . Obesity    . Osteoarthritis    . Shortness of breath    . Sigmoid diverticulosis    . Sore throat    . Spinal stenosis    . Thyroid disorder    . TIA (transient ischemic attack)    . Transient cerebral ischemia      Past Surgical History   Procedure Laterality Date   .  Carpal tunnel release     . Hemorrhoid surgery  1983   . Knee arthroscopy       BILATERAL   . Cataract removal     . Colonoscopy  07-12-09     Dr Beckey Downing   . Upper gastrointestinal endoscopy  12-31-08     Dr Beckey Downing   . Appendectomy     . Nasal septum surgery       repair   . Cholecystectomy     . Colonoscopy  11-06-05     Dr Beckey Downing   . Colonoscopy  12-17-03     Dr Beckey Downing   . Upper gastrointestinal endoscopy  12-21-03     Dr Beckey Downing   . Upper gastrointestinal endoscopy  10-30-05     Dr Beckey Downing   . Vascular surgery  03-20-12 [facility]     L carotid endarterectomy with Vascu-Guard patch repair. L cervical carotid arteriograms after endarterectomy   . Colonoscopy  01-14-09     Dr Beckey Downing   . Upper gastrointestinal endoscopy  12-31-08     Dr Beckey Downing   . Carotid endarterectomy  03-19-12     Dr Allyne Gee   . Hernia repair       Family History   Problem Relation Age of Onset   . Colon Cancer Mother    . Colon Polyps Mother    . Colon Cancer Brother    . Emphysema Father    . Heart Failure Father    . Colon Cancer Brother    . Cancer Maternal Aunt    . Colon  Cancer Maternal Grandfather    . Esophageal Cancer Neg Hx    . Liver Disease Neg Hx    . Liver Cancer Neg Hx    . Rectal Cancer Neg Hx    . Stomach Cancer Neg Hx      Social History   Substance Use Topics   . Smoking status: Never Smoker   . Smokeless tobacco: Never Used   . Alcohol use No      Current Outpatient Prescriptions   Medication Sig Dispense Refill   . linaclotide (LINZESS) 145 MCG capsule Take 1 capsule by mouth every morning (before breakfast) 30 capsule 11   . acetaminophen (TYLENOL) 500 MG tablet Take 1,000 mg by mouth every 6 hours as needed for Pain     . clopidogrel (PLAVIX) 75 MG tablet Take 1 tablet by mouth daily 90 tablet 3   . hydrochlorothiazide (MICROZIDE) 12.5 MG capsule Take 1 capsule by mouth every morning 30 capsule 5   . cloNIDine (CATAPRES) 0.1 MG tablet Take 1 tablet by mouth every evening 90 tablet 5   . atenolol (TENORMIN) 50 MG tablet Take 1 tablet by mouth 2 times daily 180 tablet 3   . losartan (COZAAR) 100 MG tablet Take 1 tablet by mouth daily 30 tablet 5   . montelukast (SINGULAIR) 10 MG tablet Take 10 mg by mouth daily     . erythromycin (ROMYCIN) 5 MG/GM ophthalmic ointment Place into the left eye nightly Nightly.     . cycloSPORINE (RESTASIS) 0.05 % ophthalmic emulsion 1 drop 2 times daily     . olopatadine (PATANASE) 0.6 % SOLN nassl soln 2 sprays by Nasal route 2 times daily     . atorvastatin (LIPITOR) 80 MG tablet Take 80 mg by mouth daily     . docusate sodium (COLACE) 100 MG capsule Take 100 mg by mouth 2 times daily.     . pantoprazole (  PROTONIX) 40 MG tablet Take 40 mg by mouth 2 times daily.     Marland Kitchen therapeutic multivitamin-minerals (THERAGRAN-M) tablet Take 1 tablet by mouth daily.     Marland Kitchen glipiZIDE (GLUCOTROL) 5 MG tablet Take 5 mg by mouth 2 times daily (before meals).     . metformin (GLUCOPHAGE-XR) 500 MG XR tablet Take 500 mg by mouth daily.     . budesonide-formoterol (SYMBICORT) 160-4.5 MCG/ACT AERO Inhale 2 puffs into the lungs 2 times daily.       .  Loratadine (CLARITIN PO) Take 5 mg by mouth 2 times daily.     . ASPIRIN PO Take 81 mg by mouth daily.     Marland Kitchen CALCIUM PO Take 600 mg by mouth 2 times daily.       No current facility-administered medications for this visit.      Allergies: Fentanyl; Pcn [penicillins]; Septra [sulfamethoxazole-trimethoprim]; Darvocet [propoxyphene n-acetaminophen]; Meloxicam; Morphine; Pristiq [desvenlafaxine]; Ultracet [tramadol-acetaminophen]; and Zanaflex [tizanidine hydrochloride]    Review of Systems  Review of Systems   Constitutional: Positive for malaise/fatigue. Negative for chills and fever.   Eyes: Negative for blurred vision.   Respiratory: Negative for cough and shortness of breath.    Cardiovascular: Negative for chest pain, palpitations, orthopnea, leg swelling and PND.   Gastrointestinal: Negative for heartburn, nausea and vomiting.   Genitourinary: Negative for dysuria.   Musculoskeletal: Negative for neck pain.   Skin: Negative for rash.   Neurological: Negative for dizziness, sensory change, speech change, focal weakness and headaches.   Endo/Heme/Allergies: Does not bruise/bleed easily.   Psychiatric/Behavioral: Negative for depression. The patient is nervous/anxious.        Objective  Vital Signs -   Visit Vitals   . BP 140/74 (Site: Left Arm, Position: Sitting, Cuff Size: Medium Adult)   . Pulse 53   . Resp 20   . Ht 5' (1.524 m)   . Wt 202 lb (91.6 kg)   . BMI 39.45 kg/m2     Physical Exam   Constitutional: She is oriented to person, place, and time. She appears well-developed and well-nourished. No distress.   obese   HENT:   Head: Normocephalic and atraumatic.   Eyes: Pupils are equal, round, and reactive to light. Right eye exhibits no discharge. Left eye exhibits no discharge.   Neck: No JVD present. No tracheal deviation present.   Cardiovascular: Normal rate, regular rhythm and intact distal pulses.  Exam reveals no gallop and no friction rub.    Murmur (2/6 murmur aortic area) heard.  No carotid bruit    Pulmonary/Chest: Effort normal and breath sounds normal. No respiratory distress. She has no wheezes. She has no rales.   Abdominal: Soft. There is no tenderness.   Musculoskeletal: She exhibits edema (1+ lower extremities- chronic and unchanged).   Ambulates with walker   Neurological: She is alert and oriented to person, place, and time. No cranial nerve deficit.   Skin: Skin is warm and dry. No rash noted.   Psychiatric: She has a normal mood and affect. Her behavior is normal. Judgment normal.   Nursing note and vitals reviewed.      Assessment:    1. Essential hypertension     2. Aortic valve stenosis, unspecified etiology       Patient is taking medications as prescribed  Stable cardiovascular status. No evidence of overt heart failure, angina or dysrhythmia.   BP improved with Amlodipine    Plan    Followup With Dr.Lineberry 6  mo  Continue amlodipine  Call with any questions or concerns  Follow up with Belinda Block, MD for non cardiac problems  Report any new problems  Cardiovascular Fitness-Exercise as tolerated.  Strive for 15 minutes of exercise most days of the week.    Cardiac / Healthy Diet  Continue current medications as directed  Continue plan of treatment  It is always recommended that you bring your medications bottles with you to each visit - this is for your safety!       Renaldo Fiddler, APRN

## 2015-01-17 MED ORDER — CLONIDINE HCL 0.1 MG PO TABS
0.1 MG | ORAL_TABLET | Freq: Every evening | ORAL | 5 refills | Status: DC
Start: 2015-01-17 — End: 2015-07-14

## 2015-01-18 ENCOUNTER — Encounter: Attending: Family | Primary: Internal Medicine

## 2015-01-25 ENCOUNTER — Encounter: Attending: Family | Primary: Internal Medicine

## 2015-01-27 ENCOUNTER — Encounter

## 2015-01-27 MED ORDER — LOSARTAN POTASSIUM 100 MG PO TABS
100 MG | ORAL_TABLET | ORAL | 5 refills | Status: DC
Start: 2015-01-27 — End: 2015-07-28

## 2015-02-10 ENCOUNTER — Ambulatory Visit: Admit: 2015-02-10 | Discharge: 2015-02-10 | Payer: MEDICARE | Attending: Family | Primary: Internal Medicine

## 2015-02-10 DIAGNOSIS — K5909 Other constipation: Secondary | ICD-10-CM

## 2015-02-10 NOTE — Patient Instructions (Addendum)
linaclotide  Pronunciation:  LIN a KLOE tide  Brand:  Linzess  What is linaclotide?  Linaclotide works by increasing the secretion of chloride and water in the intestines, which can soften stools and stimulate bowel movements.  Linaclotide is used to treat chronic constipation, or chronic irritable bowel syndrome (IBS) in people who have had constipation as the main symptom.  Linaclotide may also be used for purposes not listed in this medication guide.  What should I discuss with my healthcare provider before taking linaclotide?  You should not use linaclotide if you are allergic to it, or if you have:   a blockage in your stomach or intestines.  Linaclotide should not be given to a child younger than 78 years old. Do not give this medicine to any child or teenager without the advice of a doctor.  FDA pregnancy category C. It is not known whether linaclotide will harm an unborn baby. Tell your doctor if you are pregnant or plan to become pregnant while using this medicine.  It is not known whether linaclotide passes into breast milk or if it could harm a nursing baby. Tell your doctor if you are breast-feeding a baby.  How should I take linaclotide?  Follow the directions on your prescription label. Do not take this medicine in larger or smaller amounts or for longer than recommended.  Do not crush, chew, break, or open a linaclotide capsule. Swallow it whole.  Take linaclotide in the morning on an empty stomach, at least 30 minutes before your first meal.  It may take up to 2 weeks before your symptoms improve. Keep using the medication as directed. Call your doctor if your symptoms do not improve, or if they get worse while using linaclotide.  Store at room temperature away from moisture and heat.  Keep the capsules in their original container, along with the packet of moisture-absorbing preservative that comes with this medicine. Keep the bottle tightly closed when not in use.  What happens if I miss a  dose?  Take the missed dose as soon as you remember. Skip the missed dose if it is almost time for your next scheduled dose. Do not take extra medicine to make up the missed dose.  What happens if I overdose?  Seek emergency medical attention or call the Poison Help line at [phone].  Overdose symptoms may include severe forms of some of the side effects listed in this medication guide.  What should I avoid while taking linaclotide?  Follow your doctor's instructions about any restrictions on food, beverages, or activity.  What are the possible side effects of linaclotide?  Get emergency medical help if you have any of these signs of an allergic reaction: hives; difficult breathing; swelling of your face, lips, tongue, or throat.  Stop using linaclotide and call your doctor at once if you have:   severe or ongoing diarrhea;   severe stomach pain; or   black, bloody, or tarry stools.  Common side effects may include:   stomach pain;   gas; or   bloating or full feeling in your stomach.  This is not a complete list of side effects and others may occur. Call your doctor for medical advice about side effects. You may report side effects to FDA at 1-800-FDA-1088.  What other drugs will affect linaclotide?  Other drugs may interact with linaclotide, including prescription, over-the-counter, vitamin, and herbal products. Tell your doctor about all medications you use, and those you start or stop using during  your treatment with linaclotide.  Where can I get more information?  Your pharmacist can provide more information about linaclotide.    Remember, keep this and all other medicines out of the reach of children, never share your medicines with others, and use this medication only for the indication prescribed.   Every effort has been made to ensure that the information provided by [employer] ('[employer]') is accurate, up-to-date, and complete, but no guarantee is made to that effect. Drug information  contained herein may be time sensitive. [employer] information has been compiled for use by healthcare practitioners and consumers in the [place] and therefore [employer] does not warrant that uses outside of the [place] are appropriate, unless specifically indicated otherwise. [employer]'s drug information does not endorse drugs, diagnose patients or recommend therapy. [employer]'s drug information is an [employer] corporate to assist licensed healthcare practitioners in caring for their patients and/or to serve consumers viewing this service as a supplement to, and not a substitute for, the expertise, skill, knowledge and judgment of healthcare practitioners. The absence of a warning for a given drug or drug combination in no way should be construed to indicate that the drug or drug combination is safe, effective or appropriate for any given patient. [employer] does not assume any responsibility for any aspect of healthcare administered with the aid of information [employer] provides. The information contained herein is not intended to cover all possible uses, directions, precautions, warnings, drug interactions, allergic reactions, or adverse effects. If you have questions about the drugs you are taking, check with your doctor, nurse or pharmacist.  Copyright [phone] [employer] Version: 2.01. Revision date: 01/02/2013.  This information does not replace the advice of a doctor. [employer] disclaims any warranty or liability for your use of this information.   Content Version: 10.9.538570

## 2015-02-10 NOTE — Progress Notes (Signed)
Subjective:      Patient ID: Valerie Nicholson is a 81 y.o. female.  Chief Complaint   Patient presents with   . Follow-up     6 wks   . Medication Check     Linzess 145 mcg       HPI  PCP: Melina Fiddler  Pt here for 6 week f/u to note med efficacy of Linzess daily for chronic constipation.  Hasn't been taking it as directed. Took it daily for about 4 days and had a diarrhea episode so she quit using it regularly. Is afraid if she gets out of the house and has the urge to go to the, she wont make it to the bathroom in time.   Still using the samples we gave her at last OV appt, hasn't picked up RX so she isn't sure if its even covered under her insurance.      OV note 11/30/2014:  Pt here for f/u to discuss med efficacy of lactulose for chronic constipation. Took it as directed 3x daily, caused diarrhea with fecal incontinence so she decreased the dose to 1x daily, still had fecal urgency and diarrhea so she had to stop taking it after trying it for about 4 weeks.    OV note 08/2014:  Pt referred here for c/o:   (1) constipation, chronic, for 50+ years. Currently taking Colace 100mg  daily, also taking Dulcolax suppositories prn. Having a BM every 4-5 days on average, has to strain, and this aggravates her internal hemorrhoids.  (2) chronic heartburn well controlled with protonix daily. No further UGI complaints, other than some dysphagia with large pills, states as long as she takes one pill at a time this isnt a problem. No dysphagia with foods.      Last EGD 08/2012 [facility])- known Barretts, this exam is same; 3 yr recall  (1) ESOPHAGUS, GASTROESOPHAGEAL JUNCTION, BIOPSY:   ---SINGLE FRAGMENT OF SQUAMOCOLUMNAR GASTROESOPHAGEAL JUNCTION WITH     MODERATE CHRONIC INFLAMMATION.   ---NEGATIVE FOR INTESTINAL METAPLASIA IN THIS LIMITED BIOPSY SAMPLE.     Last colonoscopy 08/2012 [facility])- (previous hx of carpeting adenoma in ascending colon with tattoo), no regrowth at tattoo, polyp; 5 yr recall  (1)  COLON, RECTAL POLYP, POLYPECTOMY:  ---TUBULAR ADENOMA.   ---NEGATIVE FOR HIGH-GRADE DYSPLASIA.      Family HX: Mother, brother, maternal grandfather had colon cancer.    Past Medical History   Diagnosis Date   . Allergic rhinitis    . Anxiety    . Aortic stenosis 01/24/2010   . Aortic stenosis    . Arthritis    . Asthma    . Barrett's esophagus    . Carotid artery stenosis 06/21/2011     left   . Chest pain    . Chronic back pain    . Colon polyps      with carpeting polyp in 2011   . Constipation    . Depression    . Diabetes mellitus (HCC)    . DVT (deep venous thrombosis) (HCC)    . Dysphagia    . GERD (gastroesophageal reflux disease) 01/07/2012   . HH (hiatus hernia)    . High blood pressure    . High risk for colon cancer 01/07/2012   . Hyperlipidemia    . Hyperlipidemia    . Hypertension    . Hyperthyroidism    . Hypothyroidism    . Lipoma    . Neck pain    . Neuropathy (HCC)    .  Obesity    . Osteoarthritis    . Shortness of breath    . Sigmoid diverticulosis    . Sore throat    . Spinal stenosis    . Thyroid disorder    . TIA (transient ischemic attack)    . Transient cerebral ischemia      Past Surgical History   Procedure Laterality Date   . Carpal tunnel release     . Hemorrhoid surgery  1983   . Knee arthroscopy       BILATERAL   . Cataract removal     . Colonoscopy  07-12-09     Dr Beckey Downing   . Upper gastrointestinal endoscopy  12-31-08     Dr Beckey Downing   . Appendectomy     . Nasal septum surgery       repair   . Cholecystectomy     . Colonoscopy  11-06-05     Dr Beckey Downing   . Colonoscopy  12-17-03     Dr Beckey Downing   . Upper gastrointestinal endoscopy  12-21-03     Dr Beckey Downing   . Upper gastrointestinal endoscopy  10-30-05     Dr Beckey Downing   . Vascular surgery  03-20-12 [facility]     L carotid endarterectomy with Vascu-Guard patch repair. L cervical carotid arteriograms after endarterectomy   . Colonoscopy  01-14-09     Dr Beckey Downing   . Upper gastrointestinal endoscopy  12-31-08     Dr Beckey Downing   . Carotid  endarterectomy  03-19-12     Dr Allyne Gee   . Hernia repair       Social History     Social History   . Marital status: Married     Spouse name: N/A   . Number of children: N/A   . Years of education: N/A     Social History Main Topics   . Smoking status: Never Smoker   . Smokeless tobacco: Never Used   . Alcohol use No   . Drug use: No   . Sexual activity: Not Asked     Other Topics Concern   . None     Social History Narrative       Allergies   Allergen Reactions   . Fentanyl Nausea And Vomiting and Other (See Comments)     Heart races.    Ginette Pitman [Penicillins] Swelling     Closes throat up   . Septra [Sulfamethoxazole-Trimethoprim]    . Darvocet [Propoxyphene N-Acetaminophen] Palpitations     Morphine or anesthesia from hemorrhoid surgery in 1983   . Meloxicam Palpitations   . Morphine Palpitations     Morphine or anesthesia from hemorrhoid surgery in 1983.   Derwood Kaplan [Desvenlafaxine] Palpitations   . Ultracet [Tramadol-Acetaminophen] Palpitations   . Zanaflex [Tizanidine Hydrochloride] Palpitations       Current Outpatient Prescriptions   Medication Sig Dispense Refill   . amLODIPine (NORVASC) 5 MG tablet      . losartan (COZAAR) 100 MG tablet TAKE 1 TABLET BY MOUTH DAILY 30 tablet 5   . cloNIDine (CATAPRES) 0.1 MG tablet Take 1 tablet by mouth every evening 90 tablet 5   . linaclotide (LINZESS) 145 MCG capsule Take 1 capsule by mouth every morning (before breakfast) 30 capsule 11   . acetaminophen (TYLENOL) 500 MG tablet Take 1,000 mg by mouth every 6 hours as needed for Pain     . clopidogrel (PLAVIX) 75 MG tablet Take 1 tablet by mouth daily 90 tablet 3   .  hydrochlorothiazide (MICROZIDE) 12.5 MG capsule Take 1 capsule by mouth every morning 30 capsule 5   . atenolol (TENORMIN) 50 MG tablet Take 1 tablet by mouth 2 times daily 180 tablet 3   . montelukast (SINGULAIR) 10 MG tablet Take 10 mg by mouth daily     . erythromycin (ROMYCIN) 5 MG/GM ophthalmic ointment Place into the left eye nightly Nightly.     .  cycloSPORINE (RESTASIS) 0.05 % ophthalmic emulsion 1 drop 2 times daily     . olopatadine (PATANASE) 0.6 % SOLN nassl soln 2 sprays by Nasal route 2 times daily     . atorvastatin (LIPITOR) 80 MG tablet Take 80 mg by mouth daily     . docusate sodium (COLACE) 100 MG capsule Take 100 mg by mouth 2 times daily.     . pantoprazole (PROTONIX) 40 MG tablet Take 40 mg by mouth 2 times daily.     Marland Kitchen therapeutic multivitamin-minerals (THERAGRAN-M) tablet Take 1 tablet by mouth daily.     Marland Kitchen glipiZIDE (GLUCOTROL) 5 MG tablet Take 5 mg by mouth 2 times daily (before meals).     . metformin (GLUCOPHAGE-XR) 500 MG XR tablet Take 500 mg by mouth daily.     . budesonide-formoterol (SYMBICORT) 160-4.5 MCG/ACT AERO Inhale 2 puffs into the lungs 2 times daily.       . Loratadine (CLARITIN PO) Take 5 mg by mouth 2 times daily.     . ASPIRIN PO Take 81 mg by mouth daily.     Marland Kitchen CALCIUM PO Take 600 mg by mouth 2 times daily.       No current facility-administered medications for this visit.        Review of Systems   Constitutional: Negative for appetite change, fatigue, fever and unexpected weight change.   HENT: Negative for hearing loss and sore throat.    Eyes: Negative for photophobia and visual disturbance.   Respiratory: Positive for cough. Negative for choking and wheezing.    Cardiovascular: Negative for chest pain, palpitations and leg swelling.   Gastrointestinal: Positive for constipation. Negative for abdominal distention, abdominal pain, anal bleeding, blood in stool, diarrhea, nausea, rectal pain and vomiting.   Genitourinary: Negative for dysuria and hematuria.   Musculoskeletal: Positive for arthralgias, back pain and neck pain. Negative for myalgias.   Skin: Negative for rash.   Allergic/Immunologic: Negative for immunocompromised state.   Neurological: Negative for dizziness and seizures.   Hematological: Does not bruise/bleed easily.   Psychiatric/Behavioral: Positive for dysphoric mood. Negative for confusion. The  patient is not nervous/anxious.        Objective:   Physical Exam   Constitutional: She is oriented to person, place, and time. She appears well-developed and well-nourished.   BP 122/68  Pulse 68  Ht 5' (1.524 m)  Wt 202 lb 6.4 oz (91.8 kg)  SpO2 98%  BMI 39.53 kg/m2     HENT:   Head: Normocephalic and atraumatic.   Eyes: Conjunctivae and EOM are normal. Pupils are equal, round, and reactive to light.   Neck: Normal range of motion. Neck supple. No thyromegaly present.   Cardiovascular: Normal rate and regular rhythm.  Exam reveals no gallop and no friction rub.    Murmur (3/6 murmur auscultated) heard.  Pulmonary/Chest: Effort normal and breath sounds normal. No respiratory distress.   Abdominal: Soft. Bowel sounds are normal. She exhibits no distension. There is no tenderness. There is no rebound.   Musculoskeletal: Normal range of motion.  Neurological: She is alert and oriented to person, place, and time. No cranial nerve deficit.   Skin: Skin is warm and dry. No pallor.   Psychiatric: She has a normal mood and affect. Judgment normal.   Nursing note and vitals reviewed.      Assessment:      1. Chronic constipation     2. Polypharmacy             Plan:      1. Get back on Linzess, take it once every other day, increase to daily dosing in a few weeks if needed  2. F/u in 6 weeks to note med efficacy.

## 2015-03-04 ENCOUNTER — Encounter

## 2015-03-04 MED ORDER — HYDROCHLOROTHIAZIDE 12.5 MG PO CAPS
12.5 MG | ORAL_CAPSULE | ORAL | 0 refills | Status: DC
Start: 2015-03-04 — End: 2015-04-05

## 2015-03-29 ENCOUNTER — Ambulatory Visit: Admit: 2015-03-29 | Discharge: 2015-03-29 | Payer: MEDICARE | Attending: Family | Primary: Internal Medicine

## 2015-03-29 DIAGNOSIS — K5909 Other constipation: Secondary | ICD-10-CM

## 2015-03-29 NOTE — Progress Notes (Signed)
Subjective:      Patient ID: Valerie Nicholson is a 81 y.o. female.  Chief Complaint   Patient presents with   . Follow-up     6 wk   . Medication Check     Linzess 145 mcg       HPI  PCP: Melina Fiddler  Pt here for 6 week f/u to note med efficacy of Linzess daily for chronic constipation.  Pt states she is afraid to take it regularly, she has been so busy with traveling over the holidays she didn't want to take it and it cause diarrhea. She wants to go ahead and start using it now to give it a try.    She also states she has a known internal hemorrhoid, she asks about hemorrhoidal banding but isnt ready to have this procedure yet. She wants to think about it and will call here to make appt with Dr Zollie Beckers if she decides she wants this done.      OV note 02/10/2015:  Pt here for 6 week f/u to note med efficacy of Linzess daily for chronic constipation.  Hasn't been taking it as directed. Took it daily for about 4 days and had a diarrhea episode so she quit using it regularly. Is afraid if she gets out of the house and has the urge to go to the, she wont make it to the bathroom in time.   Still using the samples we gave her at last OV appt, hasn't picked up RX so she isn't sure if its even covered under her insurance.      OV note 11/30/2014:  Pt here for f/u to discuss med efficacy of lactulose for chronic constipation. Took it as directed 3x daily, caused diarrhea with fecal incontinence so she decreased the dose to 1x daily, still had fecal urgency and diarrhea so she had to stop taking it after trying it for about 4 weeks.    OV note 08/2014:  Pt referred here for c/o:   (1) constipation, chronic, for 50+ years. Currently taking Colace 100mg  daily, also taking Dulcolax suppositories prn. Having a BM every 4-5 days on average, has to strain, and this aggravates her internal hemorrhoids.  (2) chronic heartburn well controlled with protonix daily. No further UGI complaints, other than some dysphagia with  large pills, states as long as she takes one pill at a time this isnt a problem. No dysphagia with foods.      Last EGD 08/2012 [facility])- known Barretts, this exam is same; 3 yr recall  (1) ESOPHAGUS, GASTROESOPHAGEAL JUNCTION, BIOPSY:   ---SINGLE FRAGMENT OF SQUAMOCOLUMNAR GASTROESOPHAGEAL JUNCTION WITH     MODERATE CHRONIC INFLAMMATION.   ---NEGATIVE FOR INTESTINAL METAPLASIA IN THIS LIMITED BIOPSY SAMPLE.     Last colonoscopy 08/2012 [facility])- (previous hx of carpeting adenoma in ascending colon with tattoo), no regrowth at tattoo, polyp; 5 yr recall  (1) COLON, RECTAL POLYP, POLYPECTOMY:  ---TUBULAR ADENOMA.   ---NEGATIVE FOR HIGH-GRADE DYSPLASIA.      Family HX: Mother, brother, maternal grandfather had colon cancer.    Past Medical History   Diagnosis Date   . Allergic rhinitis    . Anxiety    . Aortic stenosis 01/24/2010   . Aortic stenosis    . Arthritis    . Asthma    . Barrett's esophagus    . Carotid artery stenosis 06/21/2011     left   . Chest pain    . Chronic back pain    .  Colon polyps      with carpeting polyp in 2011   . Constipation    . Depression    . Diabetes mellitus (HCC)    . DVT (deep venous thrombosis) (HCC)    . Dysphagia    . GERD (gastroesophageal reflux disease) 01/07/2012   . HH (hiatus hernia)    . High blood pressure    . High risk for colon cancer 01/07/2012   . Hyperlipidemia    . Hyperlipidemia    . Hypertension    . Hyperthyroidism    . Hypothyroidism    . Lipoma    . Neck pain    . Neuropathy (HCC)    . Obesity    . Osteoarthritis    . Shortness of breath    . Sigmoid diverticulosis    . Sore throat    . Spinal stenosis    . Thyroid disorder    . TIA (transient ischemic attack)    . Transient cerebral ischemia      Past Surgical History   Procedure Laterality Date   . Carpal tunnel release     . Hemorrhoid surgery  1983   . Knee arthroscopy       BILATERAL   . Cataract removal     . Colonoscopy  07-12-09     Dr Beckey Downing   . Upper gastrointestinal endoscopy   12-31-08     Dr Beckey Downing   . Appendectomy     . Nasal septum surgery       repair   . Cholecystectomy     . Colonoscopy  11-06-05     Dr Beckey Downing   . Colonoscopy  12-17-03     Dr Beckey Downing   . Upper gastrointestinal endoscopy  12-21-03     Dr Beckey Downing   . Upper gastrointestinal endoscopy  10-30-05     Dr Beckey Downing   . Vascular surgery  03-20-12 [facility]     L carotid endarterectomy with Vascu-Guard patch repair. L cervical carotid arteriograms after endarterectomy   . Colonoscopy  01-14-09     Dr Beckey Downing   . Upper gastrointestinal endoscopy  12-31-08     Dr Beckey Downing   . Carotid endarterectomy  03-19-12     Dr Allyne Gee   . Hernia repair       Social History     Social History   . Marital status: Married     Spouse name: N/A   . Number of children: N/A   . Years of education: N/A     Social History Main Topics   . Smoking status: Never Smoker   . Smokeless tobacco: Never Used   . Alcohol use No   . Drug use: No   . Sexual activity: Not Asked     Other Topics Concern   . None     Social History Narrative       Allergies   Allergen Reactions   . Fentanyl Nausea And Vomiting and Other (See Comments)     Heart races.    Ginette Pitman [Penicillins] Swelling     Closes throat up   . Septra [Sulfamethoxazole-Trimethoprim]    . Darvocet [Propoxyphene N-Acetaminophen] Palpitations     Morphine or anesthesia from hemorrhoid surgery in 1983   . Meloxicam Palpitations   . Morphine Palpitations     Morphine or anesthesia from hemorrhoid surgery in 1983.   Derwood Kaplan [Desvenlafaxine] Palpitations   . Ultracet [Tramadol-Acetaminophen] Palpitations   . Zanaflex [Tizanidine Hydrochloride] Palpitations  Current Outpatient Prescriptions   Medication Sig Dispense Refill   . hydrochlorothiazide (MICROZIDE) 12.5 MG capsule TAKE ONE CAPSULE BY MOUTH EVERY MORNING 30 capsule 0   . amLODIPine (NORVASC) 5 MG tablet      . losartan (COZAAR) 100 MG tablet TAKE 1 TABLET BY MOUTH DAILY 30 tablet 5   . cloNIDine (CATAPRES) 0.1 MG tablet Take 1 tablet by  mouth every evening 90 tablet 5   . linaclotide (LINZESS) 145 MCG capsule Take 1 capsule by mouth every morning (before breakfast) 30 capsule 11   . acetaminophen (TYLENOL) 500 MG tablet Take 1,000 mg by mouth every 6 hours as needed for Pain     . clopidogrel (PLAVIX) 75 MG tablet Take 1 tablet by mouth daily 90 tablet 3   . atenolol (TENORMIN) 50 MG tablet Take 1 tablet by mouth 2 times daily 180 tablet 3   . montelukast (SINGULAIR) 10 MG tablet Take 10 mg by mouth daily     . erythromycin (ROMYCIN) 5 MG/GM ophthalmic ointment Place into the left eye nightly Nightly.     . cycloSPORINE (RESTASIS) 0.05 % ophthalmic emulsion 1 drop 2 times daily     . olopatadine (PATANASE) 0.6 % SOLN nassl soln 2 sprays by Nasal route 2 times daily     . atorvastatin (LIPITOR) 80 MG tablet Take 80 mg by mouth daily     . docusate sodium (COLACE) 100 MG capsule Take 100 mg by mouth 2 times daily.     . pantoprazole (PROTONIX) 40 MG tablet Take 40 mg by mouth 2 times daily.     Marland Kitchen therapeutic multivitamin-minerals (THERAGRAN-M) tablet Take 1 tablet by mouth daily.     Marland Kitchen glipiZIDE (GLUCOTROL) 5 MG tablet Take 5 mg by mouth 2 times daily (before meals).     . metformin (GLUCOPHAGE-XR) 500 MG XR tablet Take 500 mg by mouth daily.     . budesonide-formoterol (SYMBICORT) 160-4.5 MCG/ACT AERO Inhale 2 puffs into the lungs 2 times daily.       . Loratadine (CLARITIN PO) Take 5 mg by mouth 2 times daily.     . ASPIRIN PO Take 81 mg by mouth daily.     Marland Kitchen CALCIUM PO Take 600 mg by mouth 2 times daily.       No current facility-administered medications for this visit.        Review of Systems   Constitutional: Positive for fatigue. Negative for appetite change, fever and unexpected weight change.   HENT: Positive for sore throat. Negative for hearing loss.    Eyes: Negative for photophobia and visual disturbance.   Respiratory: Negative for cough, choking and wheezing.    Cardiovascular: Negative for chest pain, palpitations and leg swelling.    Gastrointestinal: Positive for constipation. Negative for abdominal distention, abdominal pain, anal bleeding, blood in stool, diarrhea, nausea, rectal pain and vomiting.   Genitourinary: Negative for dysuria and hematuria.   Musculoskeletal: Positive for arthralgias, back pain and neck pain. Negative for myalgias.   Skin: Negative for rash.   Allergic/Immunologic: Negative for immunocompromised state.   Neurological: Negative for dizziness and seizures.   Hematological: Does not bruise/bleed easily.   Psychiatric/Behavioral: Positive for dysphoric mood (chronic/stable). Negative for confusion. The patient is nervous/anxious (chronic/stable).        Objective:   Physical Exam   Constitutional: She is oriented to person, place, and time. She appears well-developed and well-nourished.   BP 126/80  Pulse 54  Ht 5' (1.524 m)  Wt 198 lb 6.4 oz (90 kg)  SpO2 98%  BMI 38.75 kg/m2       HENT:   Head: Normocephalic and atraumatic.   Eyes: Conjunctivae and EOM are normal. Pupils are equal, round, and reactive to light.   Neck: Normal range of motion. Neck supple. No thyromegaly present.   Cardiovascular: Normal rate and regular rhythm.  Exam reveals no gallop and no friction rub.    Murmur (3/6 murmur auscultated) heard.  Pulmonary/Chest: Effort normal and breath sounds normal. No respiratory distress.   Abdominal: Soft. Bowel sounds are normal. She exhibits no distension. There is no tenderness. There is no rebound.   Musculoskeletal: Normal range of motion.   Uses rolling walker with seat for assistance with ambulation   Neurological: She is alert and oriented to person, place, and time. No cranial nerve deficit.   Skin: Skin is warm and dry. No pallor.   Psychiatric: She has a normal mood and affect. Judgment normal.   Nursing note and vitals reviewed.      Assessment:      1. Chronic constipation     2. History of colon polyps     3. Barrett's esophagus without dysplasia             Plan:      1. Start the Linzess and  f/u in 6 weeks to note med efficacy. Pt may cancel appt if it is working well

## 2015-03-29 NOTE — Patient Instructions (Addendum)
linaclotide  Pronunciation:  LIN a KLOE tide  Brand:  Linzess  What is linaclotide?  Linaclotide works by increasing the secretion of chloride and water in the intestines, which can soften stools and stimulate bowel movements.  Linaclotide is used to treat chronic constipation, or chronic irritable bowel syndrome (IBS) in people who have had constipation as the main symptom.  Linaclotide may also be used for purposes not listed in this medication guide.  What should I discuss with my healthcare provider before taking linaclotide?  You should not use linaclotide if you are allergic to it, or if you have:   a blockage in your stomach or intestines.  Linaclotide should not be given to a child younger than 21 years old. Do not give this medicine to any child or teenager without the advice of a doctor.  FDA pregnancy category C. It is not known whether linaclotide will harm an unborn baby. Tell your doctor if you are pregnant or plan to become pregnant while using this medicine.  It is not known whether linaclotide passes into breast milk or if it could harm a nursing baby. Tell your doctor if you are breast-feeding a baby.  How should I take linaclotide?  Follow the directions on your prescription label. Do not take this medicine in larger or smaller amounts or for longer than recommended.  Do not crush, chew, break, or open a linaclotide capsule. Swallow it whole.  Take linaclotide in the morning on an empty stomach, at least 30 minutes before your first meal.  It may take up to 2 weeks before your symptoms improve. Keep using the medication as directed. Call your doctor if your symptoms do not improve, or if they get worse while using linaclotide.  Store at room temperature away from moisture and heat.  Keep the capsules in their original container, along with the packet of moisture-absorbing preservative that comes with this medicine. Keep the bottle tightly closed when not in use.  What happens if I miss a  dose?  Take the missed dose as soon as you remember. Skip the missed dose if it is almost time for your next scheduled dose. Do not take extra medicine to make up the missed dose.  What happens if I overdose?  Seek emergency medical attention or call the Poison Help line at [phone].  Overdose symptoms may include severe forms of some of the side effects listed in this medication guide.  What should I avoid while taking linaclotide?  Follow your doctor's instructions about any restrictions on food, beverages, or activity.  What are the possible side effects of linaclotide?  Get emergency medical help if you have any of these signs of an allergic reaction: hives; difficult breathing; swelling of your face, lips, tongue, or throat.  Stop using linaclotide and call your doctor at once if you have:   severe or ongoing diarrhea;   severe stomach pain; or   black, bloody, or tarry stools.  Common side effects may include:   stomach pain;   gas; or   bloating or full feeling in your stomach.  This is not a complete list of side effects and others may occur. Call your doctor for medical advice about side effects. You may report side effects to FDA at 1-800-FDA-1088.  What other drugs will affect linaclotide?  Other drugs may interact with linaclotide, including prescription, over-the-counter, vitamin, and herbal products. Tell your doctor about all medications you use, and those you start or stop using during  your treatment with linaclotide.  Where can I get more information?  Your pharmacist can provide more information about linaclotide.    Remember, keep this and all other medicines out of the reach of children, never share your medicines with others, and use this medication only for the indication prescribed.   Every effort has been made to ensure that the information provided by [employer] ('[employer]') is accurate, up-to-date, and complete, but no guarantee is made to that effect. Drug information  contained herein may be time sensitive. [employer] information has been compiled for use by healthcare practitioners and consumers in the [place] and therefore [employer] does not warrant that uses outside of the [place] are appropriate, unless specifically indicated otherwise. [employer]'s drug information does not endorse drugs, diagnose patients or recommend therapy. [employer]'s drug information is an [employer] corporate to assist licensed healthcare practitioners in caring for their patients and/or to serve consumers viewing this service as a supplement to, and not a substitute for, the expertise, skill, knowledge and judgment of healthcare practitioners. The absence of a warning for a given drug or drug combination in no way should be construed to indicate that the drug or drug combination is safe, effective or appropriate for any given patient. [employer] does not assume any responsibility for any aspect of healthcare administered with the aid of information [employer] provides. The information contained herein is not intended to cover all possible uses, directions, precautions, warnings, drug interactions, allergic reactions, or adverse effects. If you have questions about the drugs you are taking, check with your doctor, nurse or pharmacist.  Copyright [phone] [employer] Version: 2.01. Revision date: 01/02/2013.  This information does not replace the advice of a doctor. [employer]orporated disclaims any warranty or liability for your use of this information.   Content Version: 11.0.578772

## 2015-04-02 ENCOUNTER — Encounter

## 2015-04-05 ENCOUNTER — Telehealth

## 2015-04-05 MED ORDER — HYDROCHLOROTHIAZIDE 12.5 MG PO CAPS
12.5 MG | ORAL_CAPSULE | Freq: Every morning | ORAL | 3 refills | Status: DC
Start: 2015-04-05 — End: 2015-07-13

## 2015-04-05 NOTE — Telephone Encounter (Signed)
Called and spoke with patient, who advised she had a question.  Patient states she has been getting HCTZ but the drug store would not fill it.  I looked in patient's chart and did not see anywhere where it had been discontinued or any reason for her not to be on it so I sent refills

## 2015-04-05 NOTE — Telephone Encounter (Signed)
Patient called requesting to speak to someone

## 2015-05-10 ENCOUNTER — Encounter: Attending: Family | Primary: Internal Medicine

## 2015-06-15 ENCOUNTER — Encounter: Payer: MEDICARE | Attending: Cardiovascular Disease | Primary: Internal Medicine

## 2015-06-16 ENCOUNTER — Ambulatory Visit: Admit: 2015-06-16 | Discharge: 2015-06-16 | Payer: MEDICARE | Attending: Family | Primary: Internal Medicine

## 2015-06-16 ENCOUNTER — Encounter

## 2015-06-16 DIAGNOSIS — I10 Essential (primary) hypertension: Secondary | ICD-10-CM

## 2015-06-16 MED ORDER — AMLODIPINE BESYLATE 5 MG PO TABS
5 MG | ORAL_TABLET | Freq: Two times a day (BID) | ORAL | 1 refills | Status: DC
Start: 2015-06-16 — End: 2015-06-16

## 2015-06-16 MED ORDER — AMLODIPINE BESYLATE 5 MG PO TABS
5 MG | ORAL_TABLET | ORAL | 3 refills | Status: AC
Start: 2015-06-16 — End: ?

## 2015-06-16 NOTE — Progress Notes (Signed)
[facility], [place].  [facility]  [address], [place]  [address]  [phone] office  [phone] fax      OFFICE VISIT:  06/16/2015    Valerie Nicholson - DOB: 1931-03-08    Reason For Visit:  Valerie Nicholson is a 82 y.o. female who is here for 6 Month Follow-Up.  Reports uncontrolled BP.    The patient presents today for cardiology follow up.  Overall, the patient is doing well from a cardiac standpoint without symptoms to suggest myocardial ischemia.  The patient reports being under a great deal of stress stating "my husband has dementia and I care for him at home."  She reports one episode of "sharp" mid sternal chest pain last week while in bed.  She has no symptoms with activity.  Her BP is never controlled well on current regimen per patient report today.  She did have a normal dobutamine stress test in 2013.  A 2D echocardiogram in 06/2014 showed normal EF, moderate AS and mild MR.  The patient has a history of carotid occlusive disease s/p left carotid endarterectomy.  She is on Lipitor 80 mg one daily.  A carotid ultrasound in 3/16 showed was without significant occlusive disease.   The patient's PCP monitors cholesterol and DM.    Subjective  Valerie Nicholson denies exertional chest pain, shortness of breath, orthopnea, paroxysmal nocturnal dyspnea, syncope, presyncope, sustained arrythmia, edema and fatigue.  The patient denies numbness or weakness to suggest cerebrovascular accident or transient ischemic attack.  Reports one episode of sharp chest pain last week.  + uncontrolled BP.    Valerie Nicholson has the following history as recorded in EpicCare:    Patient Active Problem List   Diagnosis Code   . Family history of colon cancer Z80.0   . Abdominal pain, acute, generalized R10.84   . Hypertension I10   . Aortic stenosis I35.0   . Carotid artery stenosis I65.29   . Preop cardiovascular exam Z01.810   . Barrett esophagus K22.70   . GERD (gastroesophageal  reflux disease) K21.9   . History of colon polyps Z86.010   . High risk for colon cancer Z91.89   . Dysphagia R13.10   . Diabetes mellitus (HCC) E11.9   . Hyperlipidemia E78.5   . Fatigue R53.83   . Precordial pain R07.2   . Abnormal EKG R94.31   . DOE (dyspnea on exertion) R06.09   . Chronic constipation K59.09   . Polypharmacy Z79.899   . Mild mitral regurgitation by prior echocardiogram I34.0     Past Medical History   Diagnosis Date   . Allergic rhinitis    . Anxiety    . Aortic stenosis 01/24/2010   . Aortic stenosis    . Arthritis    . Asthma    . Barrett's esophagus    . Carotid artery stenosis 06/21/2011     left   . Chest pain    . Chronic back pain    . Colon polyps      with carpeting polyp in 2011   . Constipation    . Depression    . Diabetes mellitus (HCC)    . DVT (deep venous thrombosis) (HCC)    . Dysphagia    . GERD (gastroesophageal reflux disease) 01/07/2012   . HH (hiatus hernia)    . High blood pressure    . High risk for colon cancer 01/07/2012   . Hyperlipidemia    .  Hyperlipidemia    . Hypertension    . Hyperthyroidism    . Hypothyroidism    . Lipoma    . Neck pain    . Neuropathy (HCC)    . Obesity    . Osteoarthritis    . Shortness of breath    . Sigmoid diverticulosis    . Sore throat    . Spinal stenosis    . Thyroid disorder    . TIA (transient ischemic attack)    . Transient cerebral ischemia      Past Surgical History   Procedure Laterality Date   . Carpal tunnel release     . Hemorrhoid surgery  1983   . Knee arthroscopy       BILATERAL   . Cataract removal     . Colonoscopy  07-12-09     Dr Beckey Downing   . Upper gastrointestinal endoscopy  12-31-08     Dr Beckey Downing   . Appendectomy     . Nasal septum surgery       repair   . Cholecystectomy     . Colonoscopy  11-06-05     Dr Beckey Downing   . Colonoscopy  12-17-03     Dr Beckey Downing   . Upper gastrointestinal endoscopy  12-21-03     Dr Beckey Downing   . Upper gastrointestinal endoscopy  10-30-05     Dr Beckey Downing   . Vascular surgery  03-20-12 [facility]     L  carotid endarterectomy with Vascu-Guard patch repair. L cervical carotid arteriograms after endarterectomy   . Colonoscopy  01-14-09     Dr Beckey Downing   . Upper gastrointestinal endoscopy  12-31-08     Dr Beckey Downing   . Carotid endarterectomy  03-19-12     Dr Allyne Gee   . Hernia repair       Family History   Problem Relation Age of Onset   . Colon Cancer Mother    . Colon Polyps Mother    . Colon Cancer Brother    . Emphysema Father    . Heart Failure Father    . Colon Cancer Brother    . Cancer Maternal Aunt    . Colon Cancer Maternal Grandfather    . Esophageal Cancer Neg Hx    . Liver Disease Neg Hx    . Liver Cancer Neg Hx    . Rectal Cancer Neg Hx    . Stomach Cancer Neg Hx      Social History   Substance Use Topics   . Smoking status: Never Smoker   . Smokeless tobacco: Never Used   . Alcohol use No      Current Outpatient Prescriptions   Medication Sig Dispense Refill   . amLODIPine (NORVASC) 5 MG tablet Take 1 tablet by mouth 2 times daily 60 tablet 1   . hydrochlorothiazide (MICROZIDE) 12.5 MG capsule Take 1 capsule by mouth every morning 90 capsule 3   . losartan (COZAAR) 100 MG tablet TAKE 1 TABLET BY MOUTH DAILY 30 tablet 5   . cloNIDine (CATAPRES) 0.1 MG tablet Take 1 tablet by mouth every evening 90 tablet 5   . linaclotide (LINZESS) 145 MCG capsule Take 1 capsule by mouth every morning (before breakfast) 30 capsule 11   . acetaminophen (TYLENOL) 500 MG tablet Take 1,000 mg by mouth every 6 hours as needed for Pain     . clopidogrel (PLAVIX) 75 MG tablet Take 1 tablet by mouth daily 90 tablet 3   . atenolol (  TENORMIN) 50 MG tablet Take 1 tablet by mouth 2 times daily 180 tablet 3   . montelukast (SINGULAIR) 10 MG tablet Take 10 mg by mouth daily     . erythromycin (ROMYCIN) 5 MG/GM ophthalmic ointment Place into the left eye nightly Nightly.     . cycloSPORINE (RESTASIS) 0.05 % ophthalmic emulsion 1 drop 2 times daily     . olopatadine (PATANASE) 0.6 % SOLN nassl soln 2 sprays by Nasal route 2 times daily      . atorvastatin (LIPITOR) 80 MG tablet Take 80 mg by mouth daily     . docusate sodium (COLACE) 100 MG capsule Take 100 mg by mouth 2 times daily.     . pantoprazole (PROTONIX) 40 MG tablet Take 40 mg by mouth 2 times daily.     Marland Kitchen therapeutic multivitamin-minerals (THERAGRAN-M) tablet Take 1 tablet by mouth daily.     Marland Kitchen glipiZIDE (GLUCOTROL) 5 MG tablet Take 5 mg by mouth 2 times daily (before meals).     . metformin (GLUCOPHAGE-XR) 500 MG XR tablet Take 500 mg by mouth daily.     . budesonide-formoterol (SYMBICORT) 160-4.5 MCG/ACT AERO Inhale 2 puffs into the lungs 2 times daily.       . Loratadine (CLARITIN PO) Take 5 mg by mouth 2 times daily.     . ASPIRIN PO Take 81 mg by mouth daily.     Marland Kitchen CALCIUM PO Take 600 mg by mouth 2 times daily.       No current facility-administered medications for this visit.      Allergies: Fentanyl; Pcn [penicillins]; Septra [sulfamethoxazole-trimethoprim]; Darvocet [propoxyphene n-acetaminophen]; Meloxicam; Morphine; Pristiq [desvenlafaxine]; Ultracet [tramadol-acetaminophen]; and Zanaflex [tizanidine hydrochloride]    Review of Systems  Constitutional - no appetite change, or unexpected weight change. No fever, chills or diaphoresis.  No significant change in activity level or new onset of fatigue.   HEENT - no significant rhinorrhea or epistaxis. No tinnitus or significant hearing loss.   Eyes - no sudden vision change or amaurosis. No corneal arcus, xantholasma, subconjunctival hemorrhage or discharge.  Respiratory - no significant wheezing, stridor, apnea or cough.  No dyspnea on exertion or shortness of air.  Cardiovascular - no exertional chest pain to suggest myocardial ischemia.  No orthopnea or PND.  No sensation of sustained arrythmia.  No occurrence of slow heart rate.  No palpitations.  No claudication.  No leg edema.  Reports one episode of sharp chest pain last week.  + uncontrolled BP.  Gastrointestinal - no abdominal swelling or pain. No blood in stool. No severe  constipation, diarrhea, nausea, or vomiting.   Genitourinary - no dysuria, frequency, or urgency. No flank pain or hematuria.   Musculoskeletal - no back pain or myalgia.  No problems with gait.  Extremities - no clubbing, cyanosis or edema.  Skin - no color change or rash.  No pallor.  No new surgical incision.   Neurologic - no speech difficulty, facial asymmetry or lateralizing weakness.  No seizures, presyncope or syncope.  No significant dizziness.  Hematologic - no easy bruising or excessive bleeding.   Psychiatric - no severe anxiety or insomnia.  No confusion.   All other review of systems are negative.    Objective  Vital Signs -   Visit Vitals   . BP (!) 172/80   . Pulse 58   . Wt 197 lb (89.4 kg)   . BMI 38.47 kg/m2     General - Valerie Nicholson is alert, cooperative,  and pleasant.  Well groomed.  No acute distress.    Body habitus - Body mass index is 38.47 kg/(m^2).  HEENT - Head is normocephalic. No circumoral cyanosis.  Dentition is normal.  EYES -   Lids normal without ptosis.  No discharge, edema or subconjunctival hemorrhage.   Neck - Symmetrical without apparent mass or lymphadenopathy.   Respiratory - Normal respiratory effort without use of accessory muscles.  Ausculatation reveals vesicular breath sounds without crackles, wheezes, rub or rhonchi.    Cardiovascular - No jugular venous distention.  Auscultation reveals regular rate and rhythm.  No audible clicks, gallop or rub.  1/6 systolic ejection murmur murmur.  No lower extremity varicosities.  Left carotid bruit noted - follows with vascular.  Abdominal -  No visible distention, mass or pulsations.  Extremities - No clubbing or cyanosis.  No statis dermatitis or ulcers. No edema.    Musculoskeletal -   No Osler's nodes.  No kyphosis or scoliosis.  Gait is even and regular without limp or shuffle.   Skin -  Warm and dry; no rash or pallor.   No new surgical wound.  Neurological - No focal neurological deficits.  Thought processes coherent.  No  apparent tremor.   Oriented to person, place and time.    Psychiatric -  Appropriate affect and mood.     Assessment:    1. Essential hypertension     2. Mixed hyperlipidemia     3. Aortic valve stenosis, unspecified etiology      07/20/14 2D echo showed EF 55-60%; peak transaortic gradient   4. Stenosis of left carotid artery      s/p left cartoid endarterectomy   5. Mild mitral regurgitation by prior echocardiogram     EKG reviewed:  NSR 58 bpm; no ectopy; no change from EKG 06/2014.    BP Readings from Last 3 Encounters:   06/16/15 (!) 172/80   03/29/15 126/80   02/10/15 122/68    Pulse Readings from Last 3 Encounters:   06/16/15 58   03/29/15 54   02/10/15 68        Wt Readings from Last 3 Encounters:   06/16/15 197 lb (89.4 kg)   03/29/15 198 lb 6.4 oz (90 kg)   02/10/15 202 lb 6.4 oz (91.8 kg)     Plan  Previous cardiac history and records reviewed.  Start taking Norvasc (amlodipine) 5 mg (1) tab twice daily for high blood pressure.  Continue other current medications as prescribed.   Continue to follow up with primary care provider for non cardiac medical concerns.    Call with any problems, questions or concerns.    Follow up as scheduled with cardiology - 3 weeks BP check.  6 months Dr. Chase Picket.    Additional patient instructions:  Risk factors for heart disease:  Cigarette use, high cholesterol, high blood pressure, obesity and diabetes.  Continue heart healthy and low sodium diet.  Exercise as tolerated.  Strive for 15 minutes of exercise most days of the week.    Blood pressure goal is 140/90 or less. If you are a diabetic, the goal is 130/80 or less.  Please keep a blood pressure log and call the office to report readings in 2 weeks at [phone].  If you are taking cholesterol lowering medications, it is recommended that lab work be checked annually.    Always keep a current medication list.  Bring your medications to every office visit.   The following educational  material has been included  in this after visit summary for your review: hypertension.     Learning About High Blood Pressure  What is high blood pressure?    Blood pressure is a measure of how hard the blood pushes against the walls of your arteries. It's normal for blood pressure to go up and down throughout the day, but if it stays up, you have high blood pressure. Another name for high blood pressure is hypertension.  Two numbers tell you your blood pressure. The first number is the systolic pressure. It shows how hard the blood pushes when your heart is pumping. The second number is the diastolic pressure. It shows how hard the blood pushes between heartbeats, when your heart is relaxed and filling with blood.  A blood pressure of less than 120/80 (say "120 over 80") is ideal for an adult. High blood pressure is 140/90 or higher. You have high blood pressure if your top number is 140 or higher or your bottom number is 90 or higher, or both. Many people fall into the category in between, called prehypertension. People with prehypertension need to make lifestyle changes to bring their blood pressure down and help prevent or delay high blood pressure.  What happens when you have high blood pressure?   Blood flows through your arteries with too much force. Over time, this damages the walls of your arteries. But you can't feel it. High blood pressure usually doesn't cause symptoms.   Fat and calcium start to build up in your arteries. This buildup is called plaque. Plaque makes your arteries narrower and stiffer. Blood can't flow through them as easily.   This lack of good blood flow starts to damage some of the organs in your body. This can lead to problems such as coronary artery disease and heart attack, heart failure, stroke, kidney failure, and eye damage.  How can you prevent high blood pressure?   Stay at a healthy weight.   Try to limit how much sodium you eat to less than 2,300 milligrams (mg) a day. If you limit your sodium to  1,500 mg a day, you can lower your blood pressure even more.   Buy foods that are labeled "unsalted," "sodium-free," or "low-sodium." Foods labeled "reduced-sodium" and "light sodium" may still have too much sodium.   Flavor your food with garlic, lemon juice, onion, vinegar, herbs, and spices instead of salt. Do not use soy sauce, steak sauce, onion salt, garlic salt, mustard, or ketchup on your food.   Use less salt (or none) when recipes call for it. You can often use half the salt a recipe calls for without losing flavor.   Be physically active. Get at least 30 minutes of exercise on most days of the week. Walking is a good choice. You also may want to do other activities, such as running, swimming, cycling, or playing tennis or team sports.   Limit alcohol to 2 drinks a day for men and 1 drink a day for women.   Eat plenty of fruits, vegetables, and low-fat dairy products. Eat less saturated and total fats.  How is high blood pressure treated?   Your doctor will suggest making lifestyle changes. For example, your doctor may ask you to eat healthy foods, quit smoking, lose extra weight, and be more active.   If lifestyle changes don't help enough or your blood pressure is very high, you will have to take medicine every day.  Follow-up care is a key part  of your treatment and safety. Be sure to make and go to all appointments, and call your doctor if you are having problems. It's also a good idea to know your test results and keep a list of the medicines you take.  Where can you learn more?  Go to [email or web address] and sign in to your MyChart account. Enter P501 in the Search Health Information box to learn more about "Learning About High Blood Pressure."     If you do not have an account, please click on the "Sign Up Now" link.  Current as of: July 21, 2014  Content Version: 11.1   2006-2016 [employer]. Care instructions adapted under license by [facility]. If you  have questions about a medical condition or this instruction, always ask your healthcare professional. [employer] disclaims any warranty or liability for your use of this information.     Aloha Gell, APRN

## 2015-06-16 NOTE — Patient Instructions (Addendum)
Start taking Norvasc (amlodipine) 5 mg (1) tab twice daily for high blood pressure.  Continue other current medications as prescribed.   Continue to follow up with primary care provider for non cardiac medical concerns.    Call with any problems, questions or concerns.    Follow up as scheduled with cardiology - 3 weeks BP check.  6 months Dr. Chase Picket.    Additional patient instructions:  Risk factors for heart disease:  Cigarette use, high cholesterol, high blood pressure, obesity and diabetes.  Continue heart healthy and low sodium diet.  Exercise as tolerated.  Strive for 15 minutes of exercise most days of the week.    Blood pressure goal is 140/90 or less. If you are a diabetic, the goal is 130/80 or less.  Please keep a blood pressure log and call the office to report readings in 2 weeks at [phone].  If you are taking cholesterol lowering medications, it is recommended that lab work be checked annually.    Always keep a current medication list.  Bring your medications to every office visit.   The following educational material has been included in this after visit summary for your review: hypertension.     Learning About High Blood Pressure  What is high blood pressure?    Blood pressure is a measure of how hard the blood pushes against the walls of your arteries. It's normal for blood pressure to go up and down throughout the day, but if it stays up, you have high blood pressure. Another name for high blood pressure is hypertension.  Two numbers tell you your blood pressure. The first number is the systolic pressure. It shows how hard the blood pushes when your heart is pumping. The second number is the diastolic pressure. It shows how hard the blood pushes between heartbeats, when your heart is relaxed and filling with blood.  A blood pressure of less than 120/80 (say "120 over 80") is ideal for an adult. High blood pressure is 140/90 or higher. You have high blood pressure if your top number is 140  or higher or your bottom number is 90 or higher, or both. Many people fall into the category in between, called prehypertension. People with prehypertension need to make lifestyle changes to bring their blood pressure down and help prevent or delay high blood pressure.  What happens when you have high blood pressure?   Blood flows through your arteries with too much force. Over time, this damages the walls of your arteries. But you can't feel it. High blood pressure usually doesn't cause symptoms.   Fat and calcium start to build up in your arteries. This buildup is called plaque. Plaque makes your arteries narrower and stiffer. Blood can't flow through them as easily.   This lack of good blood flow starts to damage some of the organs in your body. This can lead to problems such as coronary artery disease and heart attack, heart failure, stroke, kidney failure, and eye damage.  How can you prevent high blood pressure?   Stay at a healthy weight.   Try to limit how much sodium you eat to less than 2,300 milligrams (mg) a day. If you limit your sodium to 1,500 mg a day, you can lower your blood pressure even more.   Buy foods that are labeled "unsalted," "sodium-free," or "low-sodium." Foods labeled "reduced-sodium" and "light sodium" may still have too much sodium.   Flavor your food with garlic, lemon juice, onion, vinegar, herbs, and  spices instead of salt. Do not use soy sauce, steak sauce, onion salt, garlic salt, mustard, or ketchup on your food.   Use less salt (or none) when recipes call for it. You can often use half the salt a recipe calls for without losing flavor.   Be physically active. Get at least 30 minutes of exercise on most days of the week. Walking is a good choice. You also may want to do other activities, such as running, swimming, cycling, or playing tennis or team sports.   Limit alcohol to 2 drinks a day for men and 1 drink a day for women.   Eat plenty of fruits, vegetables, and  low-fat dairy products. Eat less saturated and total fats.  How is high blood pressure treated?   Your doctor will suggest making lifestyle changes. For example, your doctor may ask you to eat healthy foods, quit smoking, lose extra weight, and be more active.   If lifestyle changes don't help enough or your blood pressure is very high, you will have to take medicine every day.  Follow-up care is a key part of your treatment and safety. Be sure to make and go to all appointments, and call your doctor if you are having problems. It's also a good idea to know your test results and keep a list of the medicines you take.  Where can you learn more?  Go to [email or web address] and sign in to your MyChart account. Enter P501 in the Search Health Information box to learn more about "Learning About High Blood Pressure."     If you do not have an account, please click on the "Sign Up Now" link.  Current as of: July 21, 2014  Content Version: 11.1   2006-2016 [employer]. Care instructions adapted under license by [facility]. If you have questions about a medical condition or this instruction, always ask your healthcare professional. [employer] disclaims any warranty or liability for your use of this information.

## 2015-07-07 ENCOUNTER — Ambulatory Visit: Admit: 2015-07-07 | Discharge: 2015-07-07 | Payer: MEDICARE | Attending: Family | Primary: Internal Medicine

## 2015-07-07 DIAGNOSIS — I10 Essential (primary) hypertension: Secondary | ICD-10-CM

## 2015-07-07 NOTE — Patient Instructions (Addendum)
Continue current medications as prescribed.   Continue to follow up with primary care provider for non cardiac medical concerns.    Call with any problems, questions or concerns.    Follow up as scheduled with cardiology - Dr. Chase Picket 12/28/15.    Additional patient instructions:  Risk factors for heart disease:  Cigarette use, high cholesterol, high blood pressure, obesity and diabetes.  Continue heart healthy and low sodium diet.  Exercise as tolerated.  Strive for 15 minutes of exercise most days of the week.    Blood pressure goal is 140/90 or less. If you are a diabetic, the goal is 130/80 or less.  If requested, please keep a blood pressure log and call the office to report readings in 2 weeks at [phone].  If you are taking cholesterol lowering medications, it is recommended that lab work be checked annually.    Always keep a current medication list.  Bring your medications to every office visit.   The following educational material has been included in this after visit summary for your review: hypertension.        Aloha Gell, APRN     Learning About High Blood Pressure  What is high blood pressure?    Blood pressure is a measure of how hard the blood pushes against the walls of your arteries. It's normal for blood pressure to go up and down throughout the day, but if it stays up, you have high blood pressure. Another name for high blood pressure is hypertension.  Two numbers tell you your blood pressure. The first number is the systolic pressure. It shows how hard the blood pushes when your heart is pumping. The second number is the diastolic pressure. It shows how hard the blood pushes between heartbeats, when your heart is relaxed and filling with blood.  A blood pressure of less than 120/80 (say "120 over 80") is ideal for an adult. High blood pressure is 140/90 or higher. You have high blood pressure if your top number is 140 or higher or your bottom number is 90 or higher, or both. Many people fall  into the category in between, called prehypertension. People with prehypertension need to make lifestyle changes to bring their blood pressure down and help prevent or delay high blood pressure.  What happens when you have high blood pressure?   Blood flows through your arteries with too much force. Over time, this damages the walls of your arteries. But you can't feel it. High blood pressure usually doesn't cause symptoms.   Fat and calcium start to build up in your arteries. This buildup is called plaque. Plaque makes your arteries narrower and stiffer. Blood can't flow through them as easily.   This lack of good blood flow starts to damage some of the organs in your body. This can lead to problems such as coronary artery disease and heart attack, heart failure, stroke, kidney failure, and eye damage.  How can you prevent high blood pressure?   Stay at a healthy weight.   Try to limit how much sodium you eat to less than 2,300 milligrams (mg) a day. If you limit your sodium to 1,500 mg a day, you can lower your blood pressure even more.   Buy foods that are labeled "unsalted," "sodium-free," or "low-sodium." Foods labeled "reduced-sodium" and "light sodium" may still have too much sodium.   Flavor your food with garlic, lemon juice, onion, vinegar, herbs, and spices instead of salt. Do not use soy sauce, steak  sauce, onion salt, garlic salt, mustard, or ketchup on your food.   Use less salt (or none) when recipes call for it. You can often use half the salt a recipe calls for without losing flavor.   Be physically active. Get at least 30 minutes of exercise on most days of the week. Walking is a good choice. You also may want to do other activities, such as running, swimming, cycling, or playing tennis or team sports.   Limit alcohol to 2 drinks a day for men and 1 drink a day for women.   Eat plenty of fruits, vegetables, and low-fat dairy products. Eat less saturated and total fats.  How is high blood  pressure treated?   Your doctor will suggest making lifestyle changes. For example, your doctor may ask you to eat healthy foods, quit smoking, lose extra weight, and be more active.   If lifestyle changes don't help enough or your blood pressure is very high, you will have to take medicine every day.  Follow-up care is a key part of your treatment and safety. Be sure to make and go to all appointments, and call your doctor if you are having problems. It's also a good idea to know your test results and keep a list of the medicines you take.  Where can you learn more?  Go to [email or web address] and sign in to your MyChart account. Enter P501 in the Search Health Information box to learn more about "Learning About High Blood Pressure."     If you do not have an account, please click on the "Sign Up Now" link.  Current as of: July 21, 2014  Content Version: 11.2   2006-2017 [employer]. Care instructions adapted under license by [facility]. If you have questions about a medical condition or this instruction, always ask your healthcare professional. [employer] disclaims any warranty or liability for your use of this information.

## 2015-07-07 NOTE — Progress Notes (Signed)
[facility], [place].  [facility]  [address], [place]  [address]  [phone] office  [phone] fax      OFFICE VISIT:  07/07/2015    Valerie Nicholson - DOB: 06-28-1930    Reason For Visit:  Valerie Nicholson is a 82 y.o. female who is here for Follow-up (3 week follow up for BP check  ) and Hypertension    The patient presents today for cardiology follow up regarding hypertension.  Overall, the patient is doing well from a cardiac standpoint without symptoms to suggest myocardial ischemia.  Last BP in office was 172/80, today's readings is 138/84.  Norvasc was increased to 5 mg twice daily.  Her home BP log shows fairly good control with occasional systolic readings of 150.  The patient is under a great deal of stress due to caring for husband with dementia.  The patient's PCP monitors cholesterol.    Subjective  Valerie Nicholson denies exertional chest pain, shortness of breath, orthopnea, paroxysmal nocturnal dyspnea, syncope, presyncope, sustained arrythmia, edema and fatigue.  The patient denies numbness or weakness to suggest cerebrovascular accident or transient ischemic attack.      Valerie Nicholson has the following history as recorded in EpicCare:    Patient Active Problem List   Diagnosis Code   . Family history of colon cancer Z80.0   . Abdominal pain, acute, generalized R10.84   . Hypertension I10   . Aortic stenosis I35.0   . Carotid artery stenosis I65.29   . Preop cardiovascular exam Z01.810   . Barrett esophagus K22.70   . GERD (gastroesophageal reflux disease) K21.9   . History of colon polyps Z86.010   . High risk for colon cancer Z91.89   . Dysphagia R13.10   . Diabetes mellitus (HCC) E11.9   . Hyperlipidemia E78.5   . Fatigue R53.83   . Precordial pain R07.2   . Abnormal EKG R94.31   . DOE (dyspnea on exertion) R06.09   . Chronic constipation K59.09   . Polypharmacy Z79.899   . Mild mitral regurgitation by prior echocardiogram I34.0   . Moderate aortic  stenosis by prior echocardiography I35.0     Past Medical History   Diagnosis Date   . Allergic rhinitis    . Anxiety    . Aortic stenosis 01/24/2010   . Aortic stenosis    . Arthritis    . Asthma    . Barrett's esophagus    . Carotid artery stenosis 06/21/2011     left   . Chest pain    . Chronic back pain    . Colon polyps      with carpeting polyp in 2011   . Constipation    . Depression    . Diabetes mellitus (HCC)    . DVT (deep venous thrombosis) (HCC)    . Dysphagia    . GERD (gastroesophageal reflux disease) 01/07/2012   . HH (hiatus hernia)    . High blood pressure    . High risk for colon cancer 01/07/2012   . Hyperlipidemia    . Hyperlipidemia    . Hypertension    . Hyperthyroidism    . Hypothyroidism    . Lipoma    . Neck pain    . Neuropathy (HCC)    . Obesity    . Osteoarthritis    . Shortness of breath    . Sigmoid diverticulosis    . Sore throat    .  Spinal stenosis    . Thyroid disorder    . TIA (transient ischemic attack)    . Transient cerebral ischemia      Past Surgical History   Procedure Laterality Date   . Carpal tunnel release     . Hemorrhoid surgery  1983   . Knee arthroscopy       BILATERAL   . Cataract removal     . Colonoscopy  07-12-09     Dr Beckey Downing   . Upper gastrointestinal endoscopy  12-31-08     Dr Beckey Downing   . Appendectomy     . Nasal septum surgery       repair   . Cholecystectomy     . Colonoscopy  11-06-05     Dr Beckey Downing   . Colonoscopy  12-17-03     Dr Beckey Downing   . Upper gastrointestinal endoscopy  12-21-03     Dr Beckey Downing   . Upper gastrointestinal endoscopy  10-30-05     Dr Beckey Downing   . Vascular surgery  03-20-12 [facility]     L carotid endarterectomy with Vascu-Guard patch repair. L cervical carotid arteriograms after endarterectomy   . Colonoscopy  01-14-09     Dr Beckey Downing   . Upper gastrointestinal endoscopy  12-31-08     Dr Beckey Downing   . Carotid endarterectomy  03-19-12     Dr Allyne Gee   . Hernia repair       Family History   Problem Relation Age of Onset   . Colon Cancer  Mother    . Colon Polyps Mother    . Colon Cancer Brother    . Emphysema Father    . Heart Failure Father    . Colon Cancer Brother    . Cancer Maternal Aunt    . Colon Cancer Maternal Grandfather    . Esophageal Cancer Neg Hx    . Liver Disease Neg Hx    . Liver Cancer Neg Hx    . Rectal Cancer Neg Hx    . Stomach Cancer Neg Hx      Social History   Substance Use Topics   . Smoking status: Never Smoker   . Smokeless tobacco: Never Used   . Alcohol use No      Current Outpatient Prescriptions   Medication Sig Dispense Refill   . cyclobenzaprine (FLEXERIL) 10 MG tablet Take 10 mg by mouth 3 times daily as needed for Muscle spasms     . amLODIPine (NORVASC) 5 MG tablet TAKE 1 TABLET BY MOUTH TWICE DAILY 180 tablet 3   . hydrochlorothiazide (MICROZIDE) 12.5 MG capsule Take 1 capsule by mouth every morning 90 capsule 3   . losartan (COZAAR) 100 MG tablet TAKE 1 TABLET BY MOUTH DAILY 30 tablet 5   . acetaminophen (TYLENOL) 500 MG tablet Take 1,000 mg by mouth every 6 hours as needed for Pain     . clopidogrel (PLAVIX) 75 MG tablet Take 1 tablet by mouth daily 90 tablet 3   . atenolol (TENORMIN) 50 MG tablet Take 1 tablet by mouth 2 times daily 180 tablet 3   . montelukast (SINGULAIR) 10 MG tablet Take 10 mg by mouth daily     . erythromycin (ROMYCIN) 5 MG/GM ophthalmic ointment Place into the left eye nightly Nightly.     . cycloSPORINE (RESTASIS) 0.05 % ophthalmic emulsion 1 drop 2 times daily     . olopatadine (PATANASE) 0.6 % SOLN nassl soln 2 sprays by Nasal route 2 times  daily     . atorvastatin (LIPITOR) 80 MG tablet Take 80 mg by mouth daily     . docusate sodium (COLACE) 100 MG capsule Take 100 mg by mouth 2 times daily.     . pantoprazole (PROTONIX) 40 MG tablet Take 40 mg by mouth 2 times daily.     Marland Kitchen therapeutic multivitamin-minerals (THERAGRAN-M) tablet Take 1 tablet by mouth daily.     Marland Kitchen glipiZIDE (GLUCOTROL) 5 MG tablet Take 5 mg by mouth 2 times daily (before meals).     . metformin (GLUCOPHAGE-XR) 500 MG  XR tablet Take 500 mg by mouth daily.     . budesonide-formoterol (SYMBICORT) 160-4.5 MCG/ACT AERO Inhale 2 puffs into the lungs 2 times daily.       . Loratadine (CLARITIN PO) Take 5 mg by mouth 2 times daily.     . ASPIRIN PO Take 81 mg by mouth daily.     Marland Kitchen CALCIUM PO Take 600 mg by mouth 2 times daily.     . cloNIDine (CATAPRES) 0.1 MG tablet Take 1 tablet by mouth every evening 90 tablet 5   . linaclotide (LINZESS) 145 MCG capsule Take 1 capsule by mouth every morning (before breakfast) 30 capsule 11     No current facility-administered medications for this visit.      Allergies: Fentanyl; Pcn [penicillins]; Septra [sulfamethoxazole-trimethoprim]; Darvocet [propoxyphene n-acetaminophen]; Meloxicam; Morphine; Pristiq [desvenlafaxine]; Ultracet [tramadol-acetaminophen]; and Zanaflex [tizanidine hydrochloride]    Review of Systems  Constitutional - no appetite change, or unexpected weight change. No fever, chills or diaphoresis.  No significant change in activity level or new onset of fatigue.   HEENT - no significant rhinorrhea or epistaxis. No tinnitus or significant hearing loss.   Eyes - no sudden vision change or amaurosis. No corneal arcus, xantholasma, subconjunctival hemorrhage or discharge.  Respiratory - no significant wheezing, stridor, apnea or cough.  No dyspnea on exertion or shortness of air.  Cardiovascular - no exertional chest pain to suggest myocardial ischemia.  No orthopnea or PND.  No sensation of sustained arrythmia.  No occurrence of slow heart rate.  No palpitations.  No claudication.  No leg edema.  Gastrointestinal - no abdominal swelling or pain. No blood in stool. No severe constipation, diarrhea, nausea, or vomiting.   Genitourinary - no dysuria, frequency, or urgency. No flank pain or hematuria.   Musculoskeletal - no back pain or myalgia.  Ambulates with assistance of walker.  Extremities - no clubbing, cyanosis or edema.  Skin - no color change or rash.  No pallor.  No new surgical  incision.   Neurologic - no speech difficulty, facial asymmetry or lateralizing weakness.  No seizures, presyncope or syncope.  No significant dizziness.  Hematologic - no easy bruising or excessive bleeding.   Psychiatric - no severe anxiety or insomnia.  No confusion.   All other review of systems are negative.    Objective  Vital Signs -   Visit Vitals   . BP 138/84   . Pulse 58   . Ht 5' (1.524 m)   . Wt 196 lb (88.9 kg)   . BMI 38.28 kg/m2     General - Valerie Nicholson is alert, cooperative, and pleasant.  Well groomed.  No acute distress.    Body habitus - Body mass index is 38.28 kg/(m^2).  HEENT - Head is normocephalic. No circumoral cyanosis.  Dentition is normal.  EYES -   Lids normal without ptosis.  No discharge, edema or subconjunctival hemorrhage.  Neck - Symmetrical without apparent mass or lymphadenopathy.   Respiratory - Normal respiratory effort without use of accessory muscles.  Ausculatation reveals vesicular breath sounds without crackles, wheezes, rub or rhonchi.    Cardiovascular - No jugular venous distention.  Auscultation reveals regular rate and rhythm.  No audible clicks, gallop or rub.  1-2/6 systolic murmur.  No lower extremity varicosities.  No carotid bruits.    Abdominal -  No visible distention, mass or pulsations.  Extremities - No clubbing or cyanosis.  No statis dermatitis or ulcers. No edema.    Musculoskeletal -   No Osler's nodes.  No kyphosis or scoliosis.  Gait is even and regular without limp or shuffle. Ambulates with assistance of walker.  Skin -  Warm and dry; no rash or pallor.   No new surgical wound.  Neurological - No focal neurological deficits.  Thought processes coherent.  No apparent tremor.   Oriented to person, place and time.    Psychiatric -  Appropriate affect and mood.     Assessment:    1. Essential hypertension     2. Moderate aortic stenosis by prior echocardiography       BP Readings from Last 3 Encounters:   07/07/15 138/84   06/16/15 (!) 172/80   03/29/15  126/80    Pulse Readings from Last 3 Encounters:   07/07/15 58   06/16/15 58   03/29/15 54        Wt Readings from Last 3 Encounters:   07/07/15 196 lb (88.9 kg)   06/16/15 197 lb (89.4 kg)   03/29/15 198 lb 6.4 oz (90 kg)     Plan  Previous cardiac history and records reviewed.  Continue current medications as prescribed.   Continue to follow up with primary care provider for non cardiac medical concerns.    Call with any problems, questions or concerns.    Follow up as scheduled with cardiology - Dr. Chase Picket 12/28/15.    Additional patient instructions:  Risk factors for heart disease:  Cigarette use, high cholesterol, high blood pressure, obesity and diabetes.  Continue heart healthy and low sodium diet.  Exercise as tolerated.  Strive for 15 minutes of exercise most days of the week.    Blood pressure goal is 140/90 or less. If you are a diabetic, the goal is 130/80 or less.  If requested, please keep a blood pressure log and call the office to report readings in 2 weeks at [id number].  If you are taking cholesterol lowering medications, it is recommended that lab work be checked annually.    Always keep a current medication list.  Bring your medications to every office visit.   The following educational material has been included in this after visit summary for your review: hypertension.     Learning About High Blood Pressure  What is high blood pressure?    Blood pressure is a measure of how hard the blood pushes against the walls of your arteries. It's normal for blood pressure to go up and down throughout the day, but if it stays up, you have high blood pressure. Another name for high blood pressure is hypertension.  Two numbers tell you your blood pressure. The first number is the systolic pressure. It shows how hard the blood pushes when your heart is pumping. The second number is the diastolic pressure. It shows how hard the blood pushes between heartbeats, when your heart is relaxed and filling with  blood.  A blood pressure  of less than 120/80 (say "120 over 80") is ideal for an adult. High blood pressure is 140/90 or higher. You have high blood pressure if your top number is 140 or higher or your bottom number is 90 or higher, or both. Many people fall into the category in between, called prehypertension. People with prehypertension need to make lifestyle changes to bring their blood pressure down and help prevent or delay high blood pressure.  What happens when you have high blood pressure?   Blood flows through your arteries with too much force. Over time, this damages the walls of your arteries. But you can't feel it. High blood pressure usually doesn't cause symptoms.   Fat and calcium start to build up in your arteries. This buildup is called plaque. Plaque makes your arteries narrower and stiffer. Blood can't flow through them as easily.   This lack of good blood flow starts to damage some of the organs in your body. This can lead to problems such as coronary artery disease and heart attack, heart failure, stroke, kidney failure, and eye damage.  How can you prevent high blood pressure?   Stay at a healthy weight.   Try to limit how much sodium you eat to less than 2,300 milligrams (mg) a day. If you limit your sodium to 1,500 mg a day, you can lower your blood pressure even more.   Buy foods that are labeled "unsalted," "sodium-free," or "low-sodium." Foods labeled "reduced-sodium" and "light sodium" may still have too much sodium.   Flavor your food with garlic, lemon juice, onion, vinegar, herbs, and spices instead of salt. Do not use soy sauce, steak sauce, onion salt, garlic salt, mustard, or ketchup on your food.   Use less salt (or none) when recipes call for it. You can often use half the salt a recipe calls for without losing flavor.   Be physically active. Get at least 30 minutes of exercise on most days of the week. Walking is a good choice. You also may want to do other activities,  such as running, swimming, cycling, or playing tennis or team sports.   Limit alcohol to 2 drinks a day for men and 1 drink a day for women.   Eat plenty of fruits, vegetables, and low-fat dairy products. Eat less saturated and total fats.  How is high blood pressure treated?   Your doctor will suggest making lifestyle changes. For example, your doctor may ask you to eat healthy foods, quit smoking, lose extra weight, and be more active.   If lifestyle changes don't help enough or your blood pressure is very high, you will have to take medicine every day.  Follow-up care is a key part of your treatment and safety. Be sure to make and go to all appointments, and call your doctor if you are having problems. It's also a good idea to know your test results and keep a list of the medicines you take.  Where can you learn more?  Go to [email or web address] and sign in to your MyChart account. Enter P501 in the Search Health Information box to learn more about "Learning About High Blood Pressure."     If you do not have an account, please click on the "Sign Up Now" link.  Current as of: July 21, 2014  Content Version: 11.2   2006-2017 [employer]. Care instructions adapted under license by [facility]. If you have questions about a medical condition or this instruction, always ask your healthcare professional. [employer] disclaims any warranty or liability for your use of this information.     Aloha Gell, APRN

## 2015-07-13 ENCOUNTER — Encounter

## 2015-07-14 MED ORDER — CLONIDINE HCL 0.1 MG PO TABS
0.1 MG | ORAL_TABLET | ORAL | 3 refills | Status: AC
Start: 2015-07-14 — End: ?

## 2015-07-14 MED ORDER — HYDROCHLOROTHIAZIDE 12.5 MG PO CAPS
12.5 MG | ORAL_CAPSULE | ORAL | 3 refills | Status: AC
Start: 2015-07-14 — End: ?

## 2015-07-26 ENCOUNTER — Encounter: Primary: Internal Medicine

## 2015-07-28 ENCOUNTER — Encounter

## 2015-07-28 MED ORDER — LOSARTAN POTASSIUM 100 MG PO TABS
100 | ORAL_TABLET | Freq: Every day | ORAL | 3 refills | Status: AC
Start: 2015-07-28 — End: ?

## 2015-07-28 MED ORDER — LOSARTAN POTASSIUM 100 MG PO TABS
100 MG | ORAL_TABLET | ORAL | 5 refills | Status: AC
Start: 2015-07-28 — End: ?

## 2015-08-12 NOTE — Telephone Encounter (Signed)
PT canceled Appointment on 07-26-15. Tried to contact to reschedule 08-10-15, 08-12-15 , No answer and No voicemail set up. Mailed Letter on 08-12-15.

## 2015-09-20 ENCOUNTER — Inpatient Hospital Stay: Admit: 2015-09-20 | Payer: MEDICARE | Primary: Internal Medicine

## 2015-09-20 DIAGNOSIS — I6523 Occlusion and stenosis of bilateral carotid arteries: Secondary | ICD-10-CM

## 2015-10-02 ENCOUNTER — Encounter

## 2015-10-03 MED ORDER — CLOPIDOGREL BISULFATE 75 MG PO TABS
75 MG | ORAL_TABLET | ORAL | 3 refills | Status: AC
Start: 2015-10-03 — End: ?

## 2015-12-28 ENCOUNTER — Encounter: Attending: Cardiovascular Disease | Primary: Internal Medicine

## 2015-12-29 ENCOUNTER — Ambulatory Visit
Admit: 2015-12-29 | Discharge: 2015-12-29 | Payer: MEDICARE | Attending: Cardiovascular Disease | Primary: Internal Medicine

## 2015-12-29 DIAGNOSIS — I350 Nonrheumatic aortic (valve) stenosis: Secondary | ICD-10-CM

## 2015-12-29 NOTE — Patient Instructions (Addendum)
Register at the [facility] located on the first floor of [facility].   Enter through hospital main entrance and turn immediately to your left.    Date/Time:  Pt will call back to schedule this test, she needs to check with her son to see when he can come and stay with her husband who has alzheimer's and can no longer be left alone.    Transthoracic Echocardiogram -  No prep.      Takes approximately 30 min.    An echocardiogram uses sound waves to produce images of your heart. This commonly used test allows your doctor to see how your heart is beating and pumping blood. Your doctor can use the images from an echocardiogram to identify various abnormalities in the heart muscle and valves.    This test has 2 parts:    You will be asked to disrobe from the waist up and given a gown to wear. The technologist will then hook up an EKG monitor to you for the entire exam.    You will then have an ultrasound of your heart (echocardiogram) to assess the heart muscle, heart valves and heart function.     You may eat and take any medicines before the exam.     If you need to change your appointment, please call outpatient scheduling at [phone].

## 2015-12-30 NOTE — Progress Notes (Signed)
[facility]  [facility] [address], [address], [place]  [address]  The following was transcribed by [name], M.T.     Valerie Nicholson DOB: 22-Apr-1931, 82 y.o. Female        Office Visit:  12/29/2015    Chief Complaint   Patient presents with   . 6 Month Follow-Up     pt states under lot of stress. pt states had chest pain tuesday night and some high bp.   . Hypertension   . Chest Pain      HISTORY OF PRESENT ILLNESS  Valerie Nicholson is seen here for valvular heart disease and hypertension.  This is a routine follow up.  Valerie Nicholson presents doing okay.  She has had a lot of stress.  She had an episode of chest pain several nights ago in a stressful period, but is not having any exertional discomforts.  She denies overt heart failure.  There has been no syncope and no neurological changes.      Patient Active Problem List   Diagnosis Code   . Family history of colon cancer Z80.0   . Abdominal pain, acute, generalized R10.84   . Hypertension I10   . Aortic stenosis I35.0   . Carotid artery stenosis I65.29   . Preop cardiovascular exam Z01.810   . Barrett esophagus K22.70   . GERD (gastroesophageal reflux disease) K21.9   . History of colon polyps Z86.010   . High risk for colon cancer Z91.89   . Dysphagia R13.10   . Diabetes mellitus (HCC) E11.9   . Hyperlipidemia E78.5   . Fatigue R53.83   . Precordial pain R07.2   . Abnormal EKG R94.31   . DOE (dyspnea on exertion) R06.09   . Chronic constipation K59.09   . Polypharmacy Z79.899   . Mild mitral regurgitation by prior echocardiogram I34.0   . Moderate aortic stenosis by prior echocardiography I35.0     Past Medical History:   Diagnosis Date   . Allergic rhinitis    . Anxiety    . Aortic stenosis 01/24/2010   . Aortic stenosis    . Arthritis    . Asthma    . Barrett's esophagus    . Carotid artery stenosis 06/21/2011    left   . Chest pain    . Chronic back pain    . Colon polyps     with carpeting polyp in 2011   . Constipation     . Depression    . Diabetes mellitus (HCC)    . DVT (deep venous thrombosis) (HCC)    . Dysphagia    . GERD (gastroesophageal reflux disease) 01/07/2012   . HH (hiatus hernia)    . High blood pressure    . High risk for colon cancer 01/07/2012   . Hyperlipidemia    . Hyperlipidemia    . Hypertension    . Hyperthyroidism    . Hypothyroidism    . Lipoma    . Neck pain    . Neuropathy (HCC)    . Obesity    . Osteoarthritis    . Shortness of breath    . Sigmoid diverticulosis    . Sore throat    . Spinal stenosis    . Thyroid disorder    . TIA (transient ischemic attack)    . Transient cerebral ischemia      Past Surgical History:   Procedure Laterality Date   .  APPENDECTOMY     . CAROTID ENDARTERECTOMY  03-19-12    Dr Allyne Gee   . CARPAL TUNNEL RELEASE     . CATARACT REMOVAL     . CHOLECYSTECTOMY     . COLONOSCOPY  07-12-09    Dr Beckey Downing   . COLONOSCOPY  11-06-05    Dr Beckey Downing   . COLONOSCOPY  12-17-03    Dr Beckey Downing   . COLONOSCOPY  01-14-09    Dr Beckey Downing   . HEMORRHOID SURGERY  1983   . HERNIA REPAIR     . KNEE ARTHROSCOPY      BILATERAL   . NASAL SEPTUM SURGERY      repair   . UPPER GASTROINTESTINAL ENDOSCOPY  12-31-08    Dr Beckey Downing   . UPPER GASTROINTESTINAL ENDOSCOPY  12-21-03    Dr Beckey Downing   . UPPER GASTROINTESTINAL ENDOSCOPY  10-30-05    Dr Beckey Downing   . UPPER GASTROINTESTINAL ENDOSCOPY  12-31-08    Dr Beckey Downing   . VASCULAR SURGERY  03-20-12 [facility]    L carotid endarterectomy with Vascu-Guard patch repair. L cervical carotid arteriograms after endarterectomy      Family History   Problem Relation Age of Onset   . Colon Cancer Mother    . Colon Polyps Mother    . Colon Cancer Brother    . Emphysema Father    . Heart Failure Father    . Colon Cancer Brother    . Cancer Maternal Aunt    . Colon Cancer Maternal Grandfather    . Esophageal Cancer Neg Hx    . Liver Disease Neg Hx    . Liver Cancer Neg Hx    . Rectal Cancer Neg Hx    . Stomach Cancer Neg Hx      Social History   Substance Use Topics   . Smoking  status: Never Smoker   . Smokeless tobacco: Never Used   . Alcohol use No      Allergies/Contraindications:  Fentanyl; Pcn [penicillins]; Septra [sulfamethoxazole-trimethoprim]; Darvocet [propoxyphene n-acetaminophen]; Meloxicam; Morphine; Pristiq [desvenlafaxine]; Ultracet [tramadol-acetaminophen]; and Zanaflex [tizanidine hydrochloride]     Outpatient Prescriptions Marked as Taking for the 12/29/15 encounter (Office Visit) with [name], MD   Medication Sig Dispense Refill   . magnesium oxide (MAG-OX) 400 MG tablet Take 400 mg by mouth daily     . clopidogrel (PLAVIX) 75 MG tablet TAKE 1 TABLET BY MOUTH DAILY. 90 tablet 3   . losartan (COZAAR) 100 MG tablet Take 1 tablet by mouth daily 90 tablet 3   . losartan (COZAAR) 100 MG tablet TAKE 1 TABLET BY MOUTH EVERY DAY. 30 tablet 5   . hydrochlorothiazide (MICROZIDE) 12.5 MG capsule TAKE 1 CAPSULE BY MOUTH EVERY MORNING 90 capsule 3   . cloNIDine (CATAPRES) 0.1 MG tablet TAKE 1 TABLET BY MOUTH EVERY EVENING 90 tablet 3   . cyclobenzaprine (FLEXERIL) 10 MG tablet Take 10 mg by mouth 3 times daily as needed for Muscle spasms     . amLODIPine (NORVASC) 5 MG tablet TAKE 1 TABLET BY MOUTH TWICE DAILY 180 tablet 3   . acetaminophen (TYLENOL) 500 MG tablet Take 1,000 mg by mouth every 6 hours as needed for Pain     . atenolol (TENORMIN) 50 MG tablet Take 1 tablet by mouth 2 times daily 180 tablet 3   . montelukast (SINGULAIR) 10 MG tablet Take 10 mg by mouth daily     . erythromycin (ROMYCIN) 5 MG/GM ophthalmic ointment Place into  the left eye nightly Nightly.     . cycloSPORINE (RESTASIS) 0.05 % ophthalmic emulsion 1 drop 2 times daily     . olopatadine (PATANASE) 0.6 % SOLN nassl soln 2 sprays by Nasal route 2 times daily     . atorvastatin (LIPITOR) 80 MG tablet Take 80 mg by mouth daily     . docusate sodium (COLACE) 100 MG capsule Take 100 mg by mouth 2 times daily.     . pantoprazole (PROTONIX) 40 MG tablet Take 40 mg by mouth 2 times daily.     Marland Kitchen therapeutic  multivitamin-minerals (THERAGRAN-M) tablet Take 1 tablet by mouth daily.     Marland Kitchen glipiZIDE (GLUCOTROL) 5 MG tablet Take 5 mg by mouth daily      . metformin (GLUCOPHAGE-XR) 500 MG XR tablet Take 500 mg by mouth daily.     . budesonide-formoterol (SYMBICORT) 160-4.5 MCG/ACT AERO Inhale 2 puffs into the lungs 2 times daily.       . Loratadine (CLARITIN PO) Take 5 mg by mouth 2 times daily.     . ASPIRIN PO Take 81 mg by mouth daily.       Data:  BP Readings from Last 3 Encounters:   12/29/15 130/70   07/07/15 138/84   06/16/15 (!) 172/80    Pulse Readings from Last 3 Encounters:   12/29/15 52   07/07/15 58   06/16/15 58        REVIEW OF SYSTEMS  Constitutional:  Negative for diaphoresis, fever, appetite change or unexpected weight change.    HENT:  Negative for nosebleeds, facial swelling, rhinorrhea and neck stiffness.  RESPIRATORY:  Negative shortness of breath, cough or sputum production.  No wheezing or stridor.   CARDIOVASCULAR:  There is no angina, no overt heart failure, and no syncope.  GASTROINTESTINAL:   Negative for abdominal distention.  GENITOURINARY:  Negative for dysuria, urgency and frequency.  MUSCULOSKELETAL:   Negative for myalgia, arthralgia and gait problem.  SKIN:  Negative for color change, pallor, rash and wound.  NEUROLOGICAL:   Negative for dizziness, weakness, light-headedness, numbness and headaches.  Negative for speech difficulty.    HEMATOLOGICAL:   Negative for bruising and bleeding easily.  PSYCHIATRIC/BEHAVIORAL:   No excessive anxiety or confusion.   Except as noted in the HPI, all other systems are negative.        PHYSICAL EXAMINATION  GENERAL:  Alert and oriented x3 in no apparent distress.  Short-term and long-term memory intact.  Judgment intact.  Oriented to time, place and person.  No depression, anxiety or agitation.  Vital Signs:  BP 130/70  Pulse 52  Ht 5' (1.524 m)  Wt 187 lb (84.8 kg)  BMI 36.52 kg/m2   HEAD:  Normocephalic without evidence of old or recent  trauma.  EYES:  Sclerae clear.  Conjunctivae pink. Pupils equal and round.  NOSE:  Negative nasal discharge or epistaxis.  THROAT:  No lesions on lips or buccal mucosa.    NECK:  Supple without mass or JVD.  Carotid pulses 2+ to palpation bilaterally without bruit.  No thyromegaly noted.    CHEST:  Equal bilateral expansion.  RESPIRATORY:  The lungs are clear to auscultation.  Normal respiratory effort.    CARDIOVASCULAR: The heart demonstrated regular rhythm and normal rate.  There is a 2/6 systolic ejection murmur radiating into the carotids.           ABDOMEN:  Soft, nontender.  Exhibits no distension.  Bowel sounds are normal.  UPPER EXTREMITY EVALUATION:  Radial pulses palpable bilaterally.  No cyanosis, clubbing or edema.   LOWER EXTREMITY EVALUATION:  Femoral, popliteal, dorsalis pedis, and posterior tibialis pulses 2+ to palpation bilaterally.  No cyanosis, clubbing, or peripheral edema.    SKIN:  Warm and dry.     MUSCULOSKELETAL:  Normal muscle strength and tone.   SKIN:  Warm, dry.  NEUROLOGIC:  Cranial nerves II through XII are grossly intact.      IMPRESSION / PLAN  1.  Aortic valve disease - we will do an echo/Doppler as it has been a year and a half.   2.  Hypertension which appears well controlled.    3.  Continue same medications.    4.  Return for wellness visit in six months.              Orders Placed This Encounter   Procedures   . ECHO Complete 2D W Doppler W Color     Standing Status:   Future     Standing Expiration Date:   12/28/2016     Order Specific Question:   Reason for exam:     Answer:   Valvular heart disease, chest pain     __________________________________  Judith Blonder. Chase Picket, M.D., Ph.D., F.A.C.C.  [facility]    cc (pcp): Belinda Block, MD

## 2016-01-04 NOTE — Telephone Encounter (Signed)
pt is very upset she can't get anyone to help her sch the 2D echo.  So she just wanted Dr Chase Picket to know and see if he could get someone to help get this test sch.  Could someone please call this lady and help her get this test sch.

## 2016-01-04 NOTE — Telephone Encounter (Signed)
Spoke with Angelique Blonder and let her know that the patient did not want to schedule test on date of visit because she needed her son to be able to be in town to keep her husband who has alzheimers. When she called back that she wanted it on 01/03/16 and I called scheduling they had no available appointments. I called the patient and left her a message of this. She then called me back and left me a message to schedule it for 01/06/16. They had the schedule held and would not allow me to schedule anything. I let the patient know this, she was a bit upset but I told her if she would speak with her son and together they could call scheduling at [phone] and be able to schedule her test together on a date that was good for both [place] and her and her son.   She then called shortly there after to Peter Garter who reached out to me to understand the situation. I explained to her that I had given he the number for her an her son to call. She was going to try explaining to the patient as well.    Apparently per Angelique Blonder the patient called again. Angelique Blonder was able to work out with Echo an availability during the held schedule time on Friday. Scheduling has been notified.     Electronically signed by Daine Floras on 01/04/2016 at 3:32 PM

## 2016-01-04 NOTE — Telephone Encounter (Signed)
Patient is scheduled for echo on Friday 12-06-15 arrival at 1:30

## 2016-01-06 ENCOUNTER — Inpatient Hospital Stay: Admit: 2016-01-06 | Payer: MEDICARE | Primary: Internal Medicine

## 2016-01-06 DIAGNOSIS — I350 Nonrheumatic aortic (valve) stenosis: Secondary | ICD-10-CM

## 2016-01-06 LAB — ECHOCARDIOGRAM COMPLETE 2D W DOPPLER W COLOR: Left Ventricular Ejection Fraction: 58

## 2016-01-16 NOTE — Telephone Encounter (Signed)
No significant change in valvular disease. Will continue to follow.   Patient voiced understanding

## 2016-01-16 NOTE — Telephone Encounter (Signed)
Patient is requesting results of echo

## 2016-06-28 ENCOUNTER — Encounter: Attending: Family | Primary: Internal Medicine

## 2016-07-14 ENCOUNTER — Emergency Department (HOSPITAL_BASED_OUTPATIENT_CLINIC_OR_DEPARTMENT_OTHER): Payer: Medicare Other

## 2016-07-14 ENCOUNTER — Emergency Department (HOSPITAL_BASED_OUTPATIENT_CLINIC_OR_DEPARTMENT_OTHER)
Admission: EM | Admit: 2016-07-14 | Discharge: 2016-07-14 | Disposition: A | Discharge status: Home / Self Care | Payer: Medicare Other | Attending: Emergency Medicine | Admitting: Emergency Medicine

## 2016-07-14 ENCOUNTER — Encounter (HOSPITAL_BASED_OUTPATIENT_CLINIC_OR_DEPARTMENT_OTHER): Payer: Self-pay | Admitting: *Deleted

## 2016-07-14 DIAGNOSIS — R42 Dizziness and giddiness: Secondary | ICD-10-CM

## 2016-07-14 DIAGNOSIS — J45909 Unspecified asthma, uncomplicated: Secondary | ICD-10-CM | POA: Insufficient documentation

## 2016-07-14 DIAGNOSIS — Z79899 Other long term (current) drug therapy: Secondary | ICD-10-CM | POA: Diagnosis not present

## 2016-07-14 DIAGNOSIS — E119 Type 2 diabetes mellitus without complications: Secondary | ICD-10-CM | POA: Diagnosis not present

## 2016-07-14 DIAGNOSIS — I10 Essential (primary) hypertension: Secondary | ICD-10-CM | POA: Insufficient documentation

## 2016-07-14 DIAGNOSIS — R791 Abnormal coagulation profile: Secondary | ICD-10-CM | POA: Diagnosis not present

## 2016-07-14 HISTORY — DX: Type 2 diabetes mellitus without complications: E11.9

## 2016-07-14 HISTORY — DX: Polyp of colon: K63.5

## 2016-07-14 HISTORY — DX: Unspecified asthma, uncomplicated: J45.909

## 2016-07-14 HISTORY — DX: Disorder of thyroid, unspecified: E07.9

## 2016-07-14 HISTORY — DX: Benign lipomatous neoplasm of skin and subcutaneous tissue of trunk: D17.1

## 2016-07-14 HISTORY — DX: Cerebral infarction, unspecified: I63.9

## 2016-07-14 HISTORY — DX: Essential (primary) hypertension: I10

## 2016-07-14 LAB — PROTIME-INR
INR: 0.96
PROTHROMBIN TIME: 12.8 s (ref 11.4–15.2)

## 2016-07-14 LAB — COMPREHENSIVE METABOLIC PANEL
ALK PHOS: 82 U/L (ref 38–126)
ALT: 12 U/L — AB (ref 14–54)
AST: 21 U/L (ref 15–41)
Albumin: 3.6 g/dL (ref 3.5–5.0)
Anion gap: 10 (ref 5–15)
BUN: 34 mg/dL — AB (ref 6–20)
CALCIUM: 8.8 mg/dL — AB (ref 8.9–10.3)
CO2: 27 mmol/L (ref 22–32)
CREATININE: 1.05 mg/dL — AB (ref 0.44–1.00)
Chloride: 95 mmol/L — ABNORMAL LOW (ref 101–111)
GFR, EST AFRICAN AMERICAN: 55 mL/min — AB (ref >60)
GFR, EST NON AFRICAN AMERICAN: 48 mL/min — AB (ref >60)
Glucose, Bld: 187 mg/dL — ABNORMAL HIGH (ref 65–99)
Potassium: 3.9 mmol/L (ref 3.5–5.1)
Sodium: 132 mmol/L — ABNORMAL LOW (ref 135–145)
Total Bilirubin: 0.7 mg/dL (ref 0.3–1.2)
Total Protein: 7.2 g/dL (ref 6.5–8.1)

## 2016-07-14 MED ORDER — MECLIZINE HCL 25 MG PO TABS: 25.0000 mg | ORAL_TABLET | Freq: Three times a day (TID) | ORAL | 0 refills | Status: DC | PRN

## 2016-07-14 MED ORDER — LORAZEPAM 2 MG/ML IJ SOLN
1.0000 mg | Freq: Once | INTRAMUSCULAR | Status: AC
  Administered 2016-07-14: 1 mg via INTRAVENOUS
  Filled 2016-07-14: qty 1

## 2016-07-14 MED ORDER — ONDANSETRON HCL 4 MG/2ML IJ SOLN
4.0000 mg | Freq: Once | INTRAMUSCULAR | Status: AC
  Administered 2016-07-14: 4 mg via INTRAVENOUS
  Filled 2016-07-14: qty 2

## 2016-07-14 MED ORDER — MECLIZINE HCL 25 MG PO TABS
25.0000 mg | ORAL_TABLET | Freq: Once | ORAL | Status: AC
  Administered 2016-07-14: 25 mg via ORAL
  Filled 2016-07-14: qty 1

## 2016-07-14 MED ORDER — SODIUM CHLORIDE 0.9 % IV SOLN
INTRAVENOUS | Status: DC
  Administered 2016-07-14: 11:00:00 via INTRAVENOUS

## 2016-07-14 NOTE — Discharge Instructions (Signed)
MRI and CT of the head without any explanation for the vertigo. Take the Antivert as directed. Follow-up with neurology referral information provided. Return for any new or worse symptoms.

## 2016-07-14 NOTE — ED Provider Notes (Signed)
[facility] DEPT [facility] Provider Note   CSN: [id number] Arrival date & time: 07/14/16  1031     History   Chief Complaint Chief Complaint  Patient presents with  . Dizziness    HPI Nicole Doyle is a 83 y.o. female.  Patient with onset of room spinning and dizziness last night around 8:00 PM. Associated with nausea and vomiting some dry heaves. No fevers no speech problems no local weakness. No history of this type of dizziness in the past. Patient thinks it's possible she may have had a stroke back in 1980 but it was never confirmed.      Past Medical History:  Diagnosis Date  . Asthma   . Colon polyps   . Diabetes mellitus without complication (HCC)    Type 2  . Hypertension   . Lipoma of abdominal wall   . Stroke ([place])   . Thyroid disease    Radioactive iodine    There are no active problems to display for this patient.   Past Surgical History:  Procedure Laterality Date  . CAROTID ARTERY ANGIOPLASTY    . CARPAL TUNNEL RELEASE    . CHOLECYSTECTOMY    . HEMORRHOID SURGERY    . LIPOMA EXCISION      OB History    No data available       Home Medications    Prior to Admission medications   Medication Sig Start Date End Date Taking? Authorizing Provider  budesonide-formoterol (SYMBICORT) 160-4.5 MCG/ACT inhaler Inhale 2 puffs into the lungs 2 (two) times daily.   Yes Historical Provider, MD  cycloSPORINE (RESTASIS) 0.05 % ophthalmic emulsion Place 1 drop into both eyes 2 (two) times daily.   Yes Historical Provider, MD  hydrochlorothiazide (MICROZIDE) 12.5 MG capsule Take 12.5 mg by mouth daily.   Yes Historical Provider, MD  loperamide (IMODIUM) 2 MG capsule Take by mouth as needed for diarrhea or loose stools.   Yes Historical Provider, MD  loratadine (CLARITIN) 10 MG tablet Take 5 mg by mouth 2 (two) times daily.   Yes Historical Provider, MD  losartan (COZAAR) 100 MG tablet Take 100 mg by mouth daily.   Yes Historical Provider, MD  metoprolol  (LOPRESSOR) 50 MG tablet Take 50 mg by mouth 2 (two) times daily.   Yes Historical Provider, MD  montelukast (SINGULAIR) 10 MG tablet Take 10 mg by mouth at bedtime.   Yes Historical Provider, MD  Multiple Vitamin (MULTIVITAMIN) tablet Take 1 tablet by mouth daily.   Yes Historical Provider, MD  Olopatadine HCl (PATANASE) 0.6 % SOLN Place 1 each into the nose every morning.   Yes Historical Provider, MD  pantoprazole (PROTONIX) 40 MG tablet Take 40 mg by mouth daily.   Yes Historical Provider, MD  senna-docusate (SENOKOT-S) 8.6-50 MG tablet Take 1 tablet by mouth at bedtime.   Yes Historical Provider, MD  meclizine (ANTIVERT) 25 MG tablet Take 1 tablet (25 mg total) by mouth 3 (three) times daily as needed for dizziness. 07/14/16   Fredia Sorrow, MD    Family History No family history on file.  Social History Social History  Substance Use Topics  . Smoking status: Never Smoker  . Smokeless tobacco: Never Used  . Alcohol use No     Allergies   Darvon [propoxyphene]; Fentanyl; and Morphine and related   Review of Systems Review of Systems  Constitutional: Negative for fever.  HENT: Negative for congestion.   Eyes: Negative for photophobia and redness.  Respiratory: Negative for shortness of  breath.   Cardiovascular: Negative for chest pain.  Gastrointestinal: Positive for nausea and vomiting.  Genitourinary: Negative for dysuria.  Musculoskeletal: Negative for back pain and neck pain.  Neurological: Positive for dizziness. Negative for headaches.  Hematological: Does not bruise/bleed easily.  Psychiatric/Behavioral: Negative for confusion.     Physical Exam Updated Vital Signs BP (!) 175/96 (BP Location: Right Arm)   Pulse 91   Temp 98.1 F (36.7 C) (Oral)   Resp 20   Ht 5' (1.524 m)   Wt 84.4 kg   SpO2 96%   BMI 36.33 kg/m   Physical Exam  Constitutional: She is oriented to person, place, and time. She appears well-developed.  HENT:  Head: Normocephalic and  atraumatic.  Eyes: Conjunctivae and EOM are normal. Pupils are equal, round, and reactive to light.  Neck: Normal range of motion. Neck supple.  Cardiovascular: Normal rate and regular rhythm.   Pulmonary/Chest: Effort normal and breath sounds normal. No respiratory distress.  Abdominal: Soft. Bowel sounds are normal. There is no tenderness.  Neurological: She is alert and oriented to person, place, and time. No cranial nerve deficit or sensory deficit. She exhibits normal muscle tone. Coordination normal.  Skin: Skin is warm.  Nursing note and vitals reviewed.    ED Treatments / Results  Labs (all labs ordered are listed, but only abnormal results are displayed) Labs Reviewed  COMPREHENSIVE METABOLIC PANEL - Abnormal; Notable for the following:       Result Value   Sodium 132 (*)    Chloride 95 (*)    Glucose, Bld 187 (*)    BUN 34 (*)    Creatinine, Ser 1.05 (*)    Calcium 8.8 (*)    ALT 12 (*)    GFR calc non Af Amer 48 (*)    GFR calc Af Amer 55 (*)    All other components within normal limits  PROTIME-INR    EKG  EKG Interpretation  Date/Time:  Saturday July 14 2016 10:59:26 EDT Ventricular Rate:  84 PR Interval:    QRS Duration: 91 QT Interval:  405 QTC Calculation: 479 R Axis:   -36 Text Interpretation:  Sinus rhythm Prolonged PR interval Left ventricular hypertrophy Inferior infarct, old No previous ECGs available Confirmed by Latausha Flamm  MD, Justus Droke [phone]) on 07/14/2016 11:21:28 AM       Radiology Ct Head Wo Contrast  Result Date: 07/14/2016 CLINICAL DATA:  Vertigo 1 day with nausea and dry heaves today. EXAM: CT HEAD WITHOUT CONTRAST TECHNIQUE: Contiguous axial images were obtained from the base of the skull through the vertex without intravenous contrast. COMPARISON:  None. FINDINGS: Brain: Ventricles, cisterns and other CSF spaces are normal. There is no mass, mass effect, shift of midline structures or acute hemorrhage. No evidence of acute infarction.  Vascular: Calcified plaque over the cavernous segment of the internal carotid arteries and vertebral arteries bilaterally. Skull: Within normal. Sinuses/Orbits: Within normal. Other: None. IMPRESSION: No acute intracranial findings. Electronically Signed   By: Marin Olp M.D.   On: 07/14/2016 12:32   Mr Brain Wo Contrast (neuro Protocol)  Result Date: 07/14/2016 CLINICAL DATA:  Vertigo with acute onset last night. EXAM: MRI HEAD WITHOUT CONTRAST TECHNIQUE: Multiplanar, multiecho pulse sequences of the brain and surrounding structures were obtained without intravenous contrast. COMPARISON:  CT same day FINDINGS: Brain: Diffusion imaging does not show any acute or subacute infarction. Brainstem and cerebellum are normal. Cerebral hemispheres do not show accelerated atrophy for age. There are a few punctate  foci of T2 and FLAIR signal in the white matter, less than often seen in healthy individuals of this age. No cortical or large vessel territory insult. No mass lesion, hemorrhage, hydrocephalus or extra-axial collection. Vascular: Major vessels at the base of the brain show flow. Skull and upper cervical spine: Negative Sinuses/Orbits: Clear/ normal Other: None significant IMPRESSION: No acute finding. No specific cause of vertigo identified. Essentially normal study for age, with only minimal age related volume loss and a few tiny white matter foci, less than often seen in healthy individuals of this age. Electronically Signed   By: Nelson Chimes M.D.   On: 07/14/2016 16:08    Procedures Procedures (including critical care time)  Medications Ordered in ED Medications  0.9 %  sodium chloride infusion ( Intravenous New Bag/Given 07/14/16 1126)  ondansetron (ZOFRAN) injection 4 mg (4 mg Intravenous Given 07/14/16 1133)  meclizine (ANTIVERT) tablet 25 mg (25 mg Oral Given 07/14/16 1147)  LORazepam (ATIVAN) injection 1 mg (1 mg Intravenous Given 07/14/16 1431)     Initial Impression / Assessment and  Plan / ED Course  I have reviewed the triage vital signs and the nursing notes.  Pertinent labs & imaging results that were available during my care of the patient were reviewed by me and considered in my medical decision making (see chart for details).     Patient with extensive workup for the vertigo. Symptoms started last evening around 8:00. Associated with some nausea and vomiting. No vomiting recently. Patient improved here with meclizine. Patient with questionable CVA in 1980. Today's workup head CT negative MRI negative for any explanation for the vertigo. Will treat symptomatically with meclizine and have her follow-up with neurology. In addition blood pressure has been elevated. Patient will require close follow-up with her primary care doctor regarding the blood pressure. Patient does have a history of hypertension. Did not have any of her medications that she normally takes for that today. She may resume those.  Final Clinical Impressions(s) / ED Diagnoses   Final diagnoses:  Vertigo    New Prescriptions New Prescriptions   MECLIZINE (ANTIVERT) 25 MG TABLET    Take 1 tablet (25 mg total) by mouth 3 (three) times daily as needed for dizziness.     Fredia Sorrow, MD 07/14/16 1622

## 2016-07-14 NOTE — ED Notes (Signed)
ED Provider at bedside. 

## 2016-07-14 NOTE — ED Triage Notes (Signed)
Patient states she developed dizziness last night at 7 pm. States the dizziness is associated with nausea and room spinning with movement.  History of past stroke with similar symptoms "years ago".

## 2016-07-14 NOTE — ED Notes (Signed)
Pt stood and pivoted x1 assist to wheelchair from vehicle

## 2016-07-14 NOTE — ED Notes (Signed)
Pt returned from MRI °

## 2016-07-14 NOTE — ED Notes (Signed)
Patient transported to MRI 

## 2016-11-30 ENCOUNTER — Other Ambulatory Visit (HOSPITAL_BASED_OUTPATIENT_CLINIC_OR_DEPARTMENT_OTHER): Payer: Self-pay | Admitting: Family Medicine

## 2016-11-30 ENCOUNTER — Ambulatory Visit (HOSPITAL_BASED_OUTPATIENT_CLINIC_OR_DEPARTMENT_OTHER)
Admission: RE | Admit: 2016-11-30 | Discharge: 2016-11-30 | Disposition: A | Discharge status: Home / Self Care | Payer: Medicare Other | Source: Ambulatory Visit | Attending: Family Medicine | Admitting: Family Medicine

## 2016-11-30 DIAGNOSIS — M79605 Pain in left leg: Secondary | ICD-10-CM | POA: Insufficient documentation

## 2016-11-30 DIAGNOSIS — Z86718 Personal history of other venous thrombosis and embolism: Secondary | ICD-10-CM | POA: Diagnosis not present

## 2017-01-09 ENCOUNTER — Emergency Department (HOSPITAL_BASED_OUTPATIENT_CLINIC_OR_DEPARTMENT_OTHER)
Admission: EM | Admit: 2017-01-09 | Discharge: 2017-01-09 | Disposition: A | Discharge status: Home / Self Care | Payer: Medicare Other | Attending: Emergency Medicine | Admitting: Emergency Medicine

## 2017-01-09 ENCOUNTER — Encounter (HOSPITAL_BASED_OUTPATIENT_CLINIC_OR_DEPARTMENT_OTHER): Payer: Self-pay

## 2017-01-09 ENCOUNTER — Emergency Department (HOSPITAL_BASED_OUTPATIENT_CLINIC_OR_DEPARTMENT_OTHER): Payer: Medicare Other

## 2017-01-09 DIAGNOSIS — E119 Type 2 diabetes mellitus without complications: Secondary | ICD-10-CM | POA: Insufficient documentation

## 2017-01-09 DIAGNOSIS — R079 Chest pain, unspecified: Secondary | ICD-10-CM | POA: Diagnosis present

## 2017-01-09 DIAGNOSIS — E079 Disorder of thyroid, unspecified: Secondary | ICD-10-CM | POA: Insufficient documentation

## 2017-01-09 DIAGNOSIS — I10 Essential (primary) hypertension: Secondary | ICD-10-CM | POA: Diagnosis not present

## 2017-01-09 DIAGNOSIS — Z7982 Long term (current) use of aspirin: Secondary | ICD-10-CM | POA: Diagnosis not present

## 2017-01-09 DIAGNOSIS — J45909 Unspecified asthma, uncomplicated: Secondary | ICD-10-CM | POA: Diagnosis not present

## 2017-01-09 DIAGNOSIS — Z79899 Other long term (current) drug therapy: Secondary | ICD-10-CM | POA: Diagnosis not present

## 2017-01-09 DIAGNOSIS — Z8673 Personal history of transient ischemic attack (TIA), and cerebral infarction without residual deficits: Secondary | ICD-10-CM | POA: Insufficient documentation

## 2017-01-09 DIAGNOSIS — Z7902 Long term (current) use of antithrombotics/antiplatelets: Secondary | ICD-10-CM | POA: Insufficient documentation

## 2017-01-09 LAB — CBC
HCT: 34.9 % — ABNORMAL LOW (ref 36.0–46.0)
HEMOGLOBIN: 12.2 g/dL (ref 12.0–15.0)
MCH: 30.1 pg (ref 26.0–34.0)
MCHC: 35 g/dL (ref 30.0–36.0)
MCV: 86.2 fL (ref 78.0–100.0)
Platelets: 245 10*3/uL (ref 150–400)
RBC: 4.05 MIL/uL (ref 3.87–5.11)
RDW: 13.5 % (ref 11.5–15.5)
WBC: 6.4 10*3/uL (ref 4.0–10.5)

## 2017-01-09 LAB — BASIC METABOLIC PANEL
ANION GAP: 8 (ref 5–15)
BUN: 31 mg/dL — AB (ref 6–20)
CHLORIDE: 97 mmol/L — AB (ref 101–111)
CO2: 25 mmol/L (ref 22–32)
Calcium: 9.1 mg/dL (ref 8.9–10.3)
Creatinine, Ser: 1.27 mg/dL — ABNORMAL HIGH (ref 0.44–1.00)
GFR calc Af Amer: 44 mL/min — ABNORMAL LOW (ref >60)
GFR calc non Af Amer: 38 mL/min — ABNORMAL LOW (ref >60)
Glucose, Bld: 137 mg/dL — ABNORMAL HIGH (ref 65–99)
POTASSIUM: 4 mmol/L (ref 3.5–5.1)
SODIUM: 130 mmol/L — AB (ref 135–145)

## 2017-01-09 LAB — TROPONIN I: Troponin I: <0.03 ng/mL (ref <0.03)

## 2017-01-09 NOTE — ED Notes (Signed)
ED Provider at bedside. 

## 2017-01-09 NOTE — Discharge Instructions (Signed)
Please return to the emergency room immediately if your chest pain recurs, or if you have any other concerns.

## 2017-01-09 NOTE — ED Notes (Signed)
Patient denies pain upon arrival to the room.

## 2017-01-09 NOTE — ED Provider Notes (Signed)
[facility] DEPT [facility] Provider Note   CSN: [id number] Arrival date & time: 01/09/17  1621     History   Chief Complaint Chief Complaint  Patient presents with  . Chest Pain    HPI Nicole Doyle is a 83 y.o. female Who presents for Evaluation of chest pain that occurred this morning. She reports that her pain was in her left anterior chest, did not radiate or move. Her pain went away after approximately 45 minutes without any intervention.  She denies any headache, lightheadedness, feeling dizzy, or sweaty. She denies any abnormal activities today. Her pain happened was she was sitting this morning. She denies a history of heart attacks.  No recent fevers, chills, cough, no shortness of breath/difficulty breathing.  HPI  Past Medical History:  Diagnosis Date  . Asthma   . Colon polyps   . Diabetes mellitus without complication (HCC)    Type 2  . Hypertension   . Lipoma of abdominal wall   . Stroke ([place])   . Thyroid disease    Radioactive iodine    There are no active problems to display for this patient.   Past Surgical History:  Procedure Laterality Date  . CAROTID ARTERY ANGIOPLASTY    . CARPAL TUNNEL RELEASE    . CHOLECYSTECTOMY    . HEMORRHOID SURGERY    . LIPOMA EXCISION      OB History    No data available       Home Medications    Prior to Admission medications   Medication Sig Start Date End Date Taking? Authorizing Provider  aspirin EC 81 MG tablet Take 81 mg by mouth daily.   Yes [provider]  cloNIDine (CATAPRES) 0.1 MG tablet Take 0.1 mg by mouth daily.   Yes [provider]  clopidogrel (PLAVIX) 75 MG tablet Take 75 mg by mouth once.   Yes [provider]  cycloSPORINE (RESTASIS) 0.05 % ophthalmic emulsion Place 1 drop into both eyes 2 (two) times daily.   Yes [provider]  FLUoxetine (PROZAC) 10 MG tablet Take 10 mg by mouth daily.   Yes [provider]  loratadine (CLARITIN) 10 MG tablet  Take 5 mg by mouth 2 (two) times daily.   Yes [provider]  losartan (COZAAR) 100 MG tablet Take 100 mg by mouth daily.   Yes [provider]  metoprolol (LOPRESSOR) 50 MG tablet Take 50 mg by mouth 2 (two) times daily.   Yes [provider]  montelukast (SINGULAIR) 10 MG tablet Take 10 mg by mouth at bedtime.   Yes [provider]  Multiple Vitamin (MULTIVITAMIN) tablet Take 1 tablet by mouth daily.   Yes [provider]  pantoprazole (PROTONIX) 40 MG tablet Take 40 mg by mouth daily.   Yes [provider]  senna-docusate (SENOKOT-S) 8.6-50 MG tablet Take 1 tablet by mouth at bedtime.   Yes [provider]  budesonide-formoterol (SYMBICORT) 160-4.5 MCG/ACT inhaler Inhale 2 puffs into the lungs 2 (two) times daily.    [provider]  hydrochlorothiazide (MICROZIDE) 12.5 MG capsule Take 12.5 mg by mouth daily.    [provider]  loperamide (IMODIUM) 2 MG capsule Take by mouth as needed for diarrhea or loose stools.    [provider]  meclizine (ANTIVERT) 25 MG tablet Take 1 tablet (25 mg total) by mouth 3 (three) times daily as needed for dizziness. 07/14/16   Fredia Sorrow, MD  Olopatadine HCl (PATANASE) 0.6 % SOLN Place 1  each into the nose every morning.    [provider]    Family History No family history on file.  Social History Social History  Substance Use Topics  . Smoking status: Never Smoker  . Smokeless tobacco: Never Used  . Alcohol use No     Allergies   Darvon [propoxyphene]; Fentanyl; Morphine and related; and Penicillins   Review of Systems Review of Systems  Constitutional: Negative for chills, diaphoresis and fever.  HENT: Negative for ear pain and sore throat.   Eyes: Negative for pain and visual disturbance.  Respiratory: Negative for cough, choking and shortness of breath.   Cardiovascular: Positive for chest pain. Negative for palpitations and leg  swelling.  Gastrointestinal: Negative for abdominal pain and vomiting.  Genitourinary: Negative for dysuria and hematuria.  Musculoskeletal: Negative for arthralgias and back pain.  Skin: Negative for color change and rash.  Neurological: Negative for seizures and syncope.  All other systems reviewed and are negative.    Physical Exam Updated Vital Signs BP (!) 187/83 (BP Location: Left Arm)   Pulse 65   Temp 98.4 F (36.9 C) (Oral)   Resp 18   Ht 5' (1.524 m)   Wt 86.6 kg (191 lb)   SpO2 100%   BMI 37.30 kg/m   Physical Exam  Constitutional: She is oriented to person, place, and time. She appears well-developed and well-nourished. No distress.  HENT:  Head: Normocephalic and atraumatic.  Mouth/Throat: Oropharynx is clear and moist.  Eyes: Conjunctivae are normal.  Neck: Neck supple.  Cardiovascular: Normal rate, regular rhythm and intact distal pulses.  Exam reveals no gallop and no friction rub.   Murmur heard. Pulmonary/Chest: Effort normal and breath sounds normal. No respiratory distress.  Abdominal: Soft. Bowel sounds are normal. She exhibits no distension. There is no tenderness.  Musculoskeletal: She exhibits no edema.  Neurological: She is alert and oriented to person, place, and time.  Skin: Skin is warm and dry.  Psychiatric: She has a normal mood and affect.  Nursing note and vitals reviewed.    ED Treatments / Results  Labs (all labs ordered are listed, but only abnormal results are displayed) Labs Reviewed  BASIC METABOLIC PANEL - Abnormal; Notable for the following:       Result Value   Sodium 130 (*)    Chloride 97 (*)    Glucose, Bld 137 (*)    BUN 31 (*)    Creatinine, Ser 1.27 (*)    GFR calc non Af Amer 38 (*)    GFR calc Af Amer 44 (*)    All other components within normal limits  CBC - Abnormal; Notable for the following:    HCT 34.9 (*)    All other components within normal limits  TROPONIN I  TROPONIN I    EKG  EKG  Interpretation  Date/Time:  Wednesday January 09 2017 17:38:06 EDT Ventricular Rate:  60 PR Interval:  180 QRS Duration: 101 QT Interval:  452 QTC Calculation: 452 R Axis:   -13 Text Interpretation:  Sinus rhythm Minimal ST elevation, anterior leads no significant change compared to earlier in the day Confirmed by Sherwood Gambler [phone]) on 01/09/2017 5:53:58 PM       Radiology Dg Chest 2 View  Result Date: 01/09/2017 CLINICAL DATA:  Left chest pain for 2 days. EXAM: CHEST  2 VIEW COMPARISON:  None. FINDINGS: Mild cardiomegaly. Lungs are clear. No effusions or edema. Degenerative changes in the shoulders and thoracic spine. No acute  bony abnormality. IMPRESSION: Cardiomegaly.  No active disease. Electronically Signed   By: Rolm Baptise M.D.   On: 01/09/2017 17:40    Procedures Procedures (including critical care time)  Medications Ordered in ED Medications - No data to display   Initial Impression / Assessment and Plan / ED Course  I have reviewed the triage vital signs and the nursing notes.  Pertinent labs & imaging results that were available during my care of the patient were reviewed by me and considered in my medical decision making (see chart for details).    Shlonda Dolloff presents for evaluation of approximately 45 minutes of chest pain that subsided on its own without intervention. At the time of evaluation she reports that she does not have any chest pain/pressure.  Troponin 2 negative, EKG without acute abnormalities. Patient does have a murmur on exam, however chart review shows that this has been previously documented. Labs obtained without obvious cause for her chest pain. Chest x-ray unremarkable.  Based on her age I suggested admission for cardiac workup and to see a cardiologist. Both myself and Dr. Regenia Skeeter had extensive conversations with the patient and her family regarding the potential risks of refusing admission.  Patient states she understands that she may  have worsening condition which may potentially lead to disability that may be severe, and or death. She states her understanding of these risks and still declines admission at this time.  I feel the patient has the ability to make this medical decision for herself. Patient was given strict return precautions and stated her understanding.  Early member reports that they will arrange cardiology follow-up and follow-up with her primary care provider.  Patient was seen as a shared visit with Dr. Regenia Skeeter.   Final Clinical Impressions(s) / ED Diagnoses   Final diagnoses:  Chest pain, unspecified type    New Prescriptions New Prescriptions   No medications on file     Ollen Gross 01/09/17 2102    Sherwood Gambler, MD 01/11/17 1247

## 2017-01-16 ENCOUNTER — Encounter: Attending: Cardiovascular Disease | Primary: Internal Medicine

## 2017-06-06 ENCOUNTER — Emergency Department (HOSPITAL_COMMUNITY): Payer: Medicare Other

## 2017-06-06 ENCOUNTER — Encounter (HOSPITAL_COMMUNITY): Payer: Self-pay | Admitting: Emergency Medicine

## 2017-06-06 ENCOUNTER — Emergency Department (HOSPITAL_COMMUNITY)
Admission: EM | Admit: 2017-06-06 | Discharge: 2017-06-07 | Disposition: A | Discharge status: Home / Self Care | Payer: Medicare Other | Attending: Emergency Medicine | Admitting: Emergency Medicine

## 2017-06-06 DIAGNOSIS — E119 Type 2 diabetes mellitus without complications: Secondary | ICD-10-CM | POA: Insufficient documentation

## 2017-06-06 DIAGNOSIS — Z79899 Other long term (current) drug therapy: Secondary | ICD-10-CM | POA: Diagnosis not present

## 2017-06-06 DIAGNOSIS — J45909 Unspecified asthma, uncomplicated: Secondary | ICD-10-CM | POA: Diagnosis not present

## 2017-06-06 DIAGNOSIS — Z7982 Long term (current) use of aspirin: Secondary | ICD-10-CM | POA: Diagnosis not present

## 2017-06-06 DIAGNOSIS — R531 Weakness: Secondary | ICD-10-CM

## 2017-06-06 DIAGNOSIS — I10 Essential (primary) hypertension: Secondary | ICD-10-CM | POA: Insufficient documentation

## 2017-06-06 DIAGNOSIS — Z7902 Long term (current) use of antithrombotics/antiplatelets: Secondary | ICD-10-CM | POA: Insufficient documentation

## 2017-06-06 DIAGNOSIS — E871 Hypo-osmolality and hyponatremia: Secondary | ICD-10-CM

## 2017-06-06 DIAGNOSIS — N179 Acute kidney failure, unspecified: Secondary | ICD-10-CM

## 2017-06-06 DIAGNOSIS — E860 Dehydration: Secondary | ICD-10-CM | POA: Diagnosis not present

## 2017-06-06 DIAGNOSIS — H5712 Ocular pain, left eye: Secondary | ICD-10-CM | POA: Insufficient documentation

## 2017-06-06 DIAGNOSIS — M6281 Muscle weakness (generalized): Secondary | ICD-10-CM | POA: Diagnosis not present

## 2017-06-06 LAB — CBC
HCT: 35.2 % — ABNORMAL LOW (ref 36.0–46.0)
HEMOGLOBIN: 12.1 g/dL (ref 12.0–15.0)
MCH: 29.8 pg (ref 26.0–34.0)
MCHC: 34.4 g/dL (ref 30.0–36.0)
MCV: 86.7 fL (ref 78.0–100.0)
PLATELETS: 247 10*3/uL (ref 150–400)
RBC: 4.06 MIL/uL (ref 3.87–5.11)
RDW: 13.9 % (ref 11.5–15.5)
WBC: 7.7 10*3/uL (ref 4.0–10.5)

## 2017-06-06 LAB — DIFFERENTIAL
Basophils Absolute: 0 10*3/uL (ref 0.0–0.1)
Basophils Relative: 0 %
EOS ABS: 0.4 10*3/uL (ref 0.0–0.7)
EOS PCT: 5 %
LYMPHS ABS: 1.9 10*3/uL (ref 0.7–4.0)
Lymphocytes Relative: 24 %
MONO ABS: 0.4 10*3/uL (ref 0.1–1.0)
Monocytes Relative: 5 %
NEUTROS PCT: 66 %
Neutro Abs: 5.1 10*3/uL (ref 1.7–7.7)

## 2017-06-06 LAB — URINALYSIS, MICROSCOPIC (REFLEX)

## 2017-06-06 LAB — I-STAT CHEM 8, ED
BUN: 44 mg/dL — AB (ref 6–20)
CALCIUM ION: 1.12 mmol/L — AB (ref 1.15–1.40)
Chloride: 91 mmol/L — ABNORMAL LOW (ref 101–111)
Creatinine, Ser: 2.5 mg/dL — ABNORMAL HIGH (ref 0.44–1.00)
Glucose, Bld: 136 mg/dL — ABNORMAL HIGH (ref 65–99)
HCT: 37 % (ref 36.0–46.0)
HEMOGLOBIN: 12.6 g/dL (ref 12.0–15.0)
Potassium: 4.4 mmol/L (ref 3.5–5.1)
SODIUM: 127 mmol/L — AB (ref 135–145)
TCO2: 24 mmol/L (ref 22–32)

## 2017-06-06 LAB — COMPREHENSIVE METABOLIC PANEL
ALBUMIN: 3.4 g/dL — AB (ref 3.5–5.0)
ALT: 13 U/L — ABNORMAL LOW (ref 14–54)
ANION GAP: 12 (ref 5–15)
AST: 24 U/L (ref 15–41)
Alkaline Phosphatase: 55 U/L (ref 38–126)
BUN: 49 mg/dL — ABNORMAL HIGH (ref 6–20)
CO2: 22 mmol/L (ref 22–32)
Calcium: 9 mg/dL (ref 8.9–10.3)
Chloride: 92 mmol/L — ABNORMAL LOW (ref 101–111)
Creatinine, Ser: 2.41 mg/dL — ABNORMAL HIGH (ref 0.44–1.00)
GFR, EST AFRICAN AMERICAN: 20 mL/min — AB (ref >60)
GFR, EST NON AFRICAN AMERICAN: 17 mL/min — AB (ref >60)
Glucose, Bld: 146 mg/dL — ABNORMAL HIGH (ref 65–99)
POTASSIUM: 4.4 mmol/L (ref 3.5–5.1)
Sodium: 126 mmol/L — ABNORMAL LOW (ref 135–145)
TOTAL PROTEIN: 6.5 g/dL (ref 6.5–8.1)
Total Bilirubin: 0.6 mg/dL (ref 0.3–1.2)

## 2017-06-06 LAB — URINALYSIS, ROUTINE W REFLEX MICROSCOPIC
Glucose, UA: NEGATIVE mg/dL
Hgb urine dipstick: NEGATIVE
KETONES UR: NEGATIVE mg/dL
NITRITE: NEGATIVE
PH: 5 (ref 5.0–8.0)
PROTEIN: 30 mg/dL — AB
Specific Gravity, Urine: 1.025 (ref 1.005–1.030)

## 2017-06-06 LAB — I-STAT TROPONIN, ED: TROPONIN I, POC: 0.01 ng/mL (ref 0.00–0.08)

## 2017-06-06 LAB — APTT: aPTT: 28 seconds (ref 24–36)

## 2017-06-06 LAB — PROTIME-INR
INR: 1.04
PROTHROMBIN TIME: 13.5 s (ref 11.4–15.2)

## 2017-06-06 MED ORDER — SODIUM CHLORIDE 0.9 % IV BOLUS (SEPSIS)
500.0000 mL | Freq: Once | INTRAVENOUS | Status: AC
  Administered 2017-06-06: 500 mL via INTRAVENOUS

## 2017-06-06 NOTE — ED Notes (Signed)
Patient was able to ambulate with a steady gait using her walker

## 2017-06-06 NOTE — ED Triage Notes (Signed)
Pt arrives with daughter from [place] states in October had new onset left eye pain, the pain comes and goes since then. Has had episodes of weakness and confusion off and on since October. Monday January 28th the patient had another episode of left eye pain with confusion and weakness and has felt tired since then more than normal. The pt refused transport for stroke eval on the 28th but facility called the daughter to make her come today for continued fatigue and confusion. Pt is currently alert and oriented X4.

## 2017-06-06 NOTE — ED Provider Notes (Signed)
I have personally seen and examined the patient. I have reviewed the documentation on PMH/FH/Soc Hx. I have discussed the plan of care with the resident and patient.  I have reviewed and agree with the resident's documentation. Please see associated encounter note.  Briefly, the patient is a 84 y.o. female here with several weeks of generalized fatigue and intermittent left eye pain.  Patient lives in independent living.  Patient and daughter endorse decreased nutritional intake.  No recent fevers or infections.  No nausea or vomiting.  No diarrhea.  No abdominal pain.  Patient denies any chest pain or shortness of breath.  No focal weakness.  Labs with mild hypokalemia her baseline.  Also noted to have mild AK I likely due to dehydration.  Patient was given IV fluids.  No evidence of infection.  Otherwise labs reassuring.  EKG nonischemic.  After IV fluids patient has significant improvement in her symptomatology and was able to ambulate without difficulty. The patient is safe for discharge with strict return precautions.    EKG Interpretation  Date/Time:  Thursday June 06 2017 15:32:18 EST Ventricular Rate:  54 PR Interval:  198 QRS Duration: 102 QT Interval:  472 QTC Calculation: 447 R Axis:   -22 Text Interpretation:  Sinus bradycardia Moderate voltage criteria for LVH, may be normal variant Borderline ECG No significant change since last tracing Confirmed by Addison Lank [phone]) on 06/06/2017 6:48:14 PM         Legacie Dillingham, Grayce Sessions, MD 06/08/17 [phone]

## 2017-06-06 NOTE — Discharge Instructions (Addendum)
Your weakness seems to be related to dehydration.  Continue to drink plenty of fluids.  Follow-up with your primary care physician in the next 1-3 days.  Be sure to use her walker at all times.

## 2017-06-06 NOTE — ED Provider Notes (Signed)
[facility] EMERGENCY DEPARTMENT Provider Note   CSN: [id number] Arrival date & time: 06/06/17  1419     History   Chief Complaint Chief Complaint  Patient presents with  . Weakness  . Eye Pain    HPI Nicole Doyle is a 84 y.o. female.  Patient is an 84 year old female coming from an independent living facility presenting with generalized weakness and left eye pain.  PMH significant for CAD with carotid angioplasty on Plavix, NIDT2DM, HTN, asthma, CVA (1980).  Patient presenting with gradual generalized weakness for the past 10 days after she was noted to be more sleepy and lethargic at her independent living facility.  She is also presenting with new onset left eye pain as of yesterday.  Patient uses walker to ambulate and can usually walk long distances without needing to stop.  Daughter states she has been able to only ambulate 200 feet.  She also has been gradually becoming more dependent over the past few months and tasks such as paying the bills.  Patient states food has been less tasteful during this time.  Patient endorses slight change in her chronic nonproductive cough.  She denies fevers or chills, chest pain, shortness of breath, nausea or vomiting, abdominal pain, or dysuria, diarrhea, melena or hematochezia.  Concerning her left eye pain, onset yesterday evening with sharp sensation which resolved through the evening and returned earlier this morning but is again resolved.  Vision states she is also been having bilateral blurring of vision since the onset yesterday.  Patient endorses chronic headache with splitting sensation.  She denies complete loss of vision, fevers or chills, jaw claudication.      Past Medical History:  Diagnosis Date  . Asthma   . Colon polyps   . Diabetes mellitus without complication (HCC)    Type 2  . Hypertension   . Lipoma of abdominal wall   . Stroke ([place])   . Thyroid disease    Radioactive iodine    There are no  active problems to display for this patient.   Past Surgical History:  Procedure Laterality Date  . CAROTID ARTERY ANGIOPLASTY    . CARPAL TUNNEL RELEASE    . CHOLECYSTECTOMY    . HEMORRHOID SURGERY    . LIPOMA EXCISION      OB History    No data available       Home Medications    Prior to Admission medications   Medication Sig Start Date End Date Taking? Authorizing Provider  aspirin EC 81 MG tablet Take 81 mg by mouth daily.    [provider]  budesonide-formoterol (SYMBICORT) 160-4.5 MCG/ACT inhaler Inhale 2 puffs into the lungs 2 (two) times daily.    [provider]  cloNIDine (CATAPRES) 0.1 MG tablet Take 0.1 mg by mouth daily.    [provider]  clopidogrel (PLAVIX) 75 MG tablet Take 75 mg by mouth once.    [provider]  cycloSPORINE (RESTASIS) 0.05 % ophthalmic emulsion Place 1 drop into both eyes 2 (two) times daily.    [provider]  FLUoxetine (PROZAC) 10 MG tablet Take 10 mg by mouth daily.    [provider]  hydrochlorothiazide (MICROZIDE) 12.5 MG capsule Take 12.5 mg by mouth daily.    [provider]  loperamide (IMODIUM) 2 MG capsule Take by mouth as needed for diarrhea or loose stools.    [provider]  loratadine (CLARITIN) 10 MG tablet Take 5 mg by mouth 2 (two)  times daily.    [provider]  losartan (COZAAR) 100 MG tablet Take 100 mg by mouth daily.    [provider]  meclizine (ANTIVERT) 25 MG tablet Take 1 tablet (25 mg total) by mouth 3 (three) times daily as needed for dizziness. 07/14/16   Fredia Sorrow, MD  metoprolol (LOPRESSOR) 50 MG tablet Take 50 mg by mouth 2 (two) times daily.    [provider]  montelukast (SINGULAIR) 10 MG tablet Take 10 mg by mouth at bedtime.    [provider]  Multiple Vitamin (MULTIVITAMIN) tablet Take 1 tablet by mouth daily.    [provider]  Olopatadine HCl (PATANASE) 0.6 % SOLN Place 1  each into the nose every morning.    [provider]  pantoprazole (PROTONIX) 40 MG tablet Take 40 mg by mouth daily.    [provider]  senna-docusate (SENOKOT-S) 8.6-50 MG tablet Take 1 tablet by mouth at bedtime.    [provider]    Family History No family history on file.  Social History Social History   Tobacco Use  . Smoking status: Never Smoker  . Smokeless tobacco: Never Used  Substance Use Topics  . Alcohol use: No  . Drug use: No     Allergies   Darvon [propoxyphene]; Fentanyl; Morphine and related; and Penicillins   Review of Systems Review of Systems  Constitutional: Negative for chills and fever.  HENT: Negative for ear pain and sore throat.   Eyes: Negative for pain and visual disturbance.  Respiratory: Positive for cough. Negative for shortness of breath.   Cardiovascular: Negative for chest pain, palpitations and leg swelling.  Gastrointestinal: Negative for abdominal pain, diarrhea, nausea and vomiting.  Genitourinary: Negative for dysuria and hematuria.  Musculoskeletal: Negative for arthralgias, back pain and gait problem.  Skin: Negative for color change and rash.  Neurological: Positive for dizziness and headaches. Negative for syncope.  All other systems reviewed and are negative.    Physical Exam Updated Vital Signs BP (!) 106/56   Pulse (!) 58   Temp 97.8 F (36.6 C) (Oral)   Resp 20   SpO2 100%   Physical Exam  Constitutional: She is oriented to person, place, and time. She appears well-developed and well-nourished. No distress.  HENT:  Head: Normocephalic and atraumatic.  Eyes: Conjunctivae and EOM are normal. Pupils are equal, round, and reactive to light.  Neck: Neck supple.  Cardiovascular: Normal rate and regular rhythm. Exam reveals no gallop and no friction rub.  Murmur heard. Chronic systolic murmur present  Pulmonary/Chest: Effort normal and breath sounds normal. No respiratory distress. She has  no wheezes. She has no rales.  Abdominal: Soft. There is no tenderness.  Musculoskeletal: She exhibits no edema.  5/5 motor strength on all 4 extremities including grip strength  Neurological: She is alert and oriented to person, place, and time.  CNII-XII intact  Skin: Skin is warm and dry.  Psychiatric: She has a normal mood and affect. Her behavior is normal.  Nursing note and vitals reviewed.    ED Treatments / Results  Labs (all labs ordered are listed, but only abnormal results are displayed) Labs Reviewed  CBC - Abnormal; Notable for the following components:      Result Value   HCT 35.2 (*)    All other components within normal limits  COMPREHENSIVE METABOLIC PANEL - Abnormal; Notable for the following components:   Sodium 126 (*)    Chloride 92 (*)  Glucose, Bld 146 (*)    BUN 49 (*)    Creatinine, Ser 2.41 (*)    Albumin 3.4 (*)    ALT 13 (*)    GFR calc non Af Amer 17 (*)    GFR calc Af Amer 20 (*)    All other components within normal limits  URINALYSIS, ROUTINE W REFLEX MICROSCOPIC - Abnormal; Notable for the following components:   APPearance CLOUDY (*)    Bilirubin Urine SMALL (*)    Protein, ur 30 (*)    Leukocytes, UA MODERATE (*)    All other components within normal limits  URINALYSIS, MICROSCOPIC (REFLEX) - Abnormal; Notable for the following components:   Bacteria, UA FEW (*)    Squamous Epithelial / LPF 6-30 (*)    All other components within normal limits  I-STAT CHEM 8, ED - Abnormal; Notable for the following components:   Sodium 127 (*)    Chloride 91 (*)    BUN 44 (*)    Creatinine, Ser 2.50 (*)    Glucose, Bld 136 (*)    Calcium, Ion 1.12 (*)    All other components within normal limits  PROTIME-INR  APTT  DIFFERENTIAL  SEDIMENTATION RATE  I-STAT TROPONIN, ED  CBG MONITORING, ED    EKG  EKG Interpretation  Date/Time:  Thursday June 06 2017 15:32:18 EST Ventricular Rate:  54 PR Interval:  198 QRS Duration: 102 QT  Interval:  472 QTC Calculation: 447 R Axis:   -22 Text Interpretation:  Sinus bradycardia Moderate voltage criteria for LVH, may be normal variant Borderline ECG No significant change since last tracing Confirmed by Addison Lank [phone]) on 06/06/2017 6:48:14 PM       Radiology Dg Chest 2 View  Result Date: 06/06/2017 CLINICAL DATA:  Cough and weakness EXAM: CHEST  2 VIEW COMPARISON:  01/09/2017 FINDINGS: No focal pulmonary infiltrate or effusion. Minimal atelectasis or scar at the lingula. Mitral annular calcification. Normal heart size. Aortic atherosclerosis. No pneumothorax. Surgical clips in the left neck. Right carotid vascular calcification. Degenerative changes of the spine. Possible calcified loose body at the right shoulder. IMPRESSION: 1. No focal pulmonary infiltrate 2. Minimal atelectasis or scar at the lingula 3. Borderline to mild cardiomegaly Electronically Signed   By: Donavan Foil M.D.   On: 06/06/2017 20:31   Ct Head Wo Contrast  Result Date: 06/06/2017 CLINICAL DATA:  Headache with sharp shooting pains through LEFT eye EXAM: CT HEAD WITHOUT CONTRAST TECHNIQUE: Contiguous axial images were obtained from the base of the skull through the vertex without intravenous contrast. Sagittal and coronal MPR images reconstructed from axial data set. COMPARISON:  07/14/2016 FINDINGS: Brain: Normal ventricular morphology. No midline shift or mass effect. Normal appearance of brain parenchyma. No intracranial hemorrhage, mass lesion, or evidence of acute infarction. No extra-axial fluid collections. Vascular: Extensive atherosclerotic calcification of internal carotid and vertebral arteries at skull base. Skull: Demineralized. Hyperostosis frontalis interna. No acute abnormalities. Sinuses/Orbits: Visualized paranasal sinuses and mastoid air cells clear Other: No acute osseous findings. IMPRESSION: No acute intracranial abnormalities. Electronically Signed   By: Lavonia Dana M.D.   On: 06/06/2017  16:39    Procedures Procedures (including critical care time)  Medications Ordered in ED Medications  sodium chloride 0.9 % bolus 500 mL (0 mLs Intravenous Stopped 06/06/17 2144)     Initial Impression / Assessment and Plan / ED Course  I have reviewed the triage vital signs and the nursing notes.  Pertinent labs & imaging results that were  available during my care of the patient were reviewed by me and considered in my medical decision making (see chart for details).    Patient is an 84 year old female coming from an independent living facility presenting with generalized weakness and left eye pain.  PMH significant for CAD with carotid angioplasty on Plavix, NIDT2DM, HTN, asthma, CVA (1980).  Patient presenting with progressive generalized weakness for 10 days and intermittent left eye pain as of yesterday.  Vitals afebrile, mildly bradycardic in the high 50s, normotensive, without tachypnea on room air.  EKG unchanged from prior with bradycardia at 54 with borderline LVH without ST changes or T wave abnormalities.  I-STAT troponin 0 0.01.  CXR with borderline mild cardiomegaly otherwise unremarkable.  CT without contrast negative.  CBC unremarkable.  CMET hyponatremia 126 with baseline around 130.  Patient has an AKI with creatinine 2.41.  UA without signs of infection.  Clinically, patient appears well and is able to sit upright without assistance.  Motor strength is 5/5 in all extremities.  Lungs are clear to auscultation bilaterally.  Patient does have nonproductive cough.  Abdominal exam unremarkable.  She is without suprapubic tenderness.  Mucous membranes are moist.  Complete neuro exam unremarkable with PERRLA and EOMI. Patient does have tenderness to left side of scalp on palpation.  ESR pending on discharge.  Low suspicion for temporal arteritis with resolution of temporal tenderness.  Ambulated patient in hall following 500 mL of normal saline bolus.  Patient with steady gait.   Patient appeared to be stable for discharge.  Reviewed return precautions with patient and daughter.  Senior living paperwork filled prior to discharge.  Patient instructed to follow-up with primary care physician within the next 1-3 days and drink plenty of fluids.  Final Clinical Impressions(s) / ED Diagnoses   Final diagnoses:  Dehydration  AKI (acute kidney injury) ([place])  Chronic hyponatremia  Generalized weakness    ED Discharge Orders    None       [place] Bing, DO 06/06/17 2222

## 2017-06-07 LAB — SEDIMENTATION RATE: Sed Rate: 24 mm/hr — ABNORMAL HIGH (ref 0–22)

## 2017-07-28 ENCOUNTER — Emergency Department (HOSPITAL_BASED_OUTPATIENT_CLINIC_OR_DEPARTMENT_OTHER): Payer: Medicare Other

## 2017-07-28 ENCOUNTER — Encounter (HOSPITAL_BASED_OUTPATIENT_CLINIC_OR_DEPARTMENT_OTHER): Payer: Self-pay | Admitting: Emergency Medicine

## 2017-07-28 ENCOUNTER — Other Ambulatory Visit: Payer: Self-pay

## 2017-07-28 ENCOUNTER — Emergency Department (HOSPITAL_BASED_OUTPATIENT_CLINIC_OR_DEPARTMENT_OTHER)
Admission: EM | Admit: 2017-07-28 | Discharge: 2017-07-28 | Disposition: A | Discharge status: Home / Self Care | Payer: Medicare Other | Attending: Emergency Medicine | Admitting: Emergency Medicine

## 2017-07-28 DIAGNOSIS — R2243 Localized swelling, mass and lump, lower limb, bilateral: Secondary | ICD-10-CM | POA: Insufficient documentation

## 2017-07-28 DIAGNOSIS — Z79899 Other long term (current) drug therapy: Secondary | ICD-10-CM | POA: Insufficient documentation

## 2017-07-28 DIAGNOSIS — M25476 Effusion, unspecified foot: Secondary | ICD-10-CM

## 2017-07-28 DIAGNOSIS — M25572 Pain in left ankle and joints of left foot: Secondary | ICD-10-CM | POA: Insufficient documentation

## 2017-07-28 DIAGNOSIS — J45909 Unspecified asthma, uncomplicated: Secondary | ICD-10-CM | POA: Insufficient documentation

## 2017-07-28 DIAGNOSIS — M25571 Pain in right ankle and joints of right foot: Secondary | ICD-10-CM | POA: Insufficient documentation

## 2017-07-28 DIAGNOSIS — Z7902 Long term (current) use of antithrombotics/antiplatelets: Secondary | ICD-10-CM | POA: Insufficient documentation

## 2017-07-28 DIAGNOSIS — M25471 Effusion, right ankle: Secondary | ICD-10-CM

## 2017-07-28 DIAGNOSIS — M25473 Effusion, unspecified ankle: Secondary | ICD-10-CM

## 2017-07-28 DIAGNOSIS — I10 Essential (primary) hypertension: Secondary | ICD-10-CM | POA: Diagnosis not present

## 2017-07-28 DIAGNOSIS — Z7982 Long term (current) use of aspirin: Secondary | ICD-10-CM | POA: Insufficient documentation

## 2017-07-28 DIAGNOSIS — E119 Type 2 diabetes mellitus without complications: Secondary | ICD-10-CM | POA: Diagnosis not present

## 2017-07-28 LAB — CBC WITH DIFFERENTIAL/PLATELET
BASOS ABS: 0 10*3/uL (ref 0.0–0.1)
BASOS PCT: 1 %
Eosinophils Absolute: 0.4 10*3/uL (ref 0.0–0.7)
Eosinophils Relative: 6 %
HEMATOCRIT: 30.9 % — AB (ref 36.0–46.0)
HEMOGLOBIN: 10.9 g/dL — AB (ref 12.0–15.0)
Lymphocytes Relative: 29 %
Lymphs Abs: 1.9 10*3/uL (ref 0.7–4.0)
MCH: 30.1 pg (ref 26.0–34.0)
MCHC: 35.3 g/dL (ref 30.0–36.0)
MCV: 85.4 fL (ref 78.0–100.0)
Monocytes Absolute: 0.8 10*3/uL (ref 0.1–1.0)
Monocytes Relative: 13 %
NEUTROS ABS: 3.3 10*3/uL (ref 1.7–7.7)
NEUTROS PCT: 51 %
Platelets: 211 10*3/uL (ref 150–400)
RBC: 3.62 MIL/uL — AB (ref 3.87–5.11)
RDW: 13 % (ref 11.5–15.5)
WBC: 6.4 10*3/uL (ref 4.0–10.5)

## 2017-07-28 LAB — BASIC METABOLIC PANEL
ANION GAP: 12 (ref 5–15)
BUN: 34 mg/dL — ABNORMAL HIGH (ref 6–20)
CHLORIDE: 91 mmol/L — AB (ref 101–111)
CO2: 26 mmol/L (ref 22–32)
CREATININE: 1.39 mg/dL — AB (ref 0.44–1.00)
Calcium: 9.1 mg/dL (ref 8.9–10.3)
GFR calc non Af Amer: 34 mL/min — ABNORMAL LOW (ref >60)
GFR, EST AFRICAN AMERICAN: 39 mL/min — AB (ref >60)
Glucose, Bld: 99 mg/dL (ref 65–99)
POTASSIUM: 3.6 mmol/L (ref 3.5–5.1)
SODIUM: 129 mmol/L — AB (ref 135–145)

## 2017-07-28 MED ORDER — ACETAMINOPHEN 325 MG PO TABS
650.0000 mg | ORAL_TABLET | Freq: Once | ORAL | Status: AC
  Administered 2017-07-28: 650 mg via ORAL
  Filled 2017-07-28: qty 2

## 2017-07-28 NOTE — Discharge Instructions (Signed)
Your ultrasound study of both of your legs today is not showing evidence of blood clot. There is no evidence of infection in your joints or gout. Your lab work is reassuring.   It is recommended that you elevate your feet to improve swelling.   You can take Tylenol every 6 hours as needed for pain. Schedule an appointment with your primary care provider to follow-up on your visit today.

## 2017-07-28 NOTE — ED Notes (Signed)
Patient transported to Ultrasound 

## 2017-07-28 NOTE — ED Provider Notes (Signed)
[facility] EMERGENCY DEPARTMENT Provider Note   CSN: [id number] Arrival date & time: 07/28/17  0944     History   Chief Complaint Chief Complaint  Patient presents with  . Ankle Pain    HPI Nicole Doyle is a 84 y.o. female w PMHx asthma, HTN, stroke, T2DM, presenting to ED with  acute onset of bilateral ankle pain and swelling, left worse than right, that began this morning upon awakening.  Denies recent injury.  States pain is worse with ambulation and palpation.  States she has neuropathy at baseline, however does not usually have swelling.  Has not taken any medications for her symptoms.  Denies redness, wounds, shortness of breath, or other complaints.  No history of DVT or PE.  Patient is on Plavix.  The history is provided by the patient.    Past Medical History:  Diagnosis Date  . Asthma   . Colon polyps   . Diabetes mellitus without complication (HCC)    Type 2  . Hypertension   . Lipoma of abdominal wall   . Stroke ([place])   . Thyroid disease    Radioactive iodine    There are no active problems to display for this patient.   Past Surgical History:  Procedure Laterality Date  . CAROTID ARTERY ANGIOPLASTY    . CARPAL TUNNEL RELEASE    . CHOLECYSTECTOMY    . HEMORRHOID SURGERY    . LIPOMA EXCISION       OB History   None      Home Medications    Prior to Admission medications   Medication Sig Start Date End Date Taking? Authorizing Provider  aspirin EC 81 MG tablet Take 81 mg by mouth daily.    [provider]  budesonide-formoterol (SYMBICORT) 160-4.5 MCG/ACT inhaler Inhale 2 puffs into the lungs 2 (two) times daily.    [provider]  cloNIDine (CATAPRES) 0.1 MG tablet Take 0.1 mg by mouth daily.    [provider]  clopidogrel (PLAVIX) 75 MG tablet Take 75 mg by mouth once.    [provider]  cycloSPORINE (RESTASIS) 0.05 % ophthalmic emulsion Place 1 drop into both eyes 2 (two) times daily.     [provider]  FLUoxetine (PROZAC) 10 MG tablet Take 10 mg by mouth daily.    [provider]  hydrochlorothiazide (MICROZIDE) 12.5 MG capsule Take 12.5 mg by mouth daily.    [provider]  loperamide (IMODIUM) 2 MG capsule Take by mouth as needed for diarrhea or loose stools.    [provider]  loratadine (CLARITIN) 10 MG tablet Take 5 mg by mouth 2 (two) times daily.    [provider]  losartan (COZAAR) 100 MG tablet Take 100 mg by mouth daily.    [provider]  meclizine (ANTIVERT) 25 MG tablet Take 1 tablet (25 mg total) by mouth 3 (three) times daily as needed for dizziness. 07/14/16   Fredia Sorrow, MD  metoprolol (LOPRESSOR) 50 MG tablet Take 50 mg by mouth 2 (two) times daily.    [provider]  montelukast (SINGULAIR) 10 MG tablet Take 10 mg by mouth at bedtime.    [provider]  Multiple Vitamin (MULTIVITAMIN) tablet Take 1 tablet by mouth daily.    [provider]  Olopatadine HCl (PATANASE) 0.6 % SOLN Place 1 each into the nose every morning.    [provider]  pantoprazole (PROTONIX) 40 MG tablet Take 40 mg by mouth daily.  [provider]  senna-docusate (SENOKOT-S) 8.6-50 MG tablet Take 1 tablet by mouth at bedtime.    [provider]    Family History No family history on file.  Social History Social History   Tobacco Use  . Smoking status: Never Smoker  . Smokeless tobacco: Never Used  Substance Use Topics  . Alcohol use: No  . Drug use: No     Allergies   Darvon [propoxyphene]; Fentanyl; Morphine and related; and Penicillins   Review of Systems Review of Systems  Constitutional: Negative for fever.  Respiratory: Negative for shortness of breath.   Musculoskeletal: Positive for arthralgias and joint swelling.  Skin: Negative for color change and wound.  Neurological: Negative for numbness.  All other systems reviewed and are  negative.    Physical Exam Updated Vital Signs BP (!) 159/60 (BP Location: Left Arm)   Pulse 62   Temp 98.2 F (36.8 C) (Oral)   Resp 18   Ht 5\' 1"  (1.549 m)   Wt 84.4 kg (186 lb)   SpO2 98%   BMI 35.14 kg/m   Physical Exam  Constitutional: She appears well-developed and well-nourished. No distress.  Well-appearing.  HENT:  Head: Normocephalic and atraumatic.  Eyes: Conjunctivae are normal.  Cardiovascular: Normal rate, regular rhythm and intact distal pulses.  Pulmonary/Chest: Effort normal and breath sounds normal. No respiratory distress.  Abdominal: Soft.  Musculoskeletal:  Bilateral ankles with nonpitting edema, left worse than right.  Generalized tenderness around ankle joint. No erythema, warmth, or wounds.  Normal range of motion.  Negative Homans sign.  Nl Sensation and intact distal pulses.  Neurological: She is alert.  Skin: Skin is warm.  Psychiatric: She has a normal mood and affect. Her behavior is normal.  Nursing note and vitals reviewed.    ED Treatments / Results  Labs (all labs ordered are listed, but only abnormal results are displayed) Labs Reviewed  CBC WITH DIFFERENTIAL/PLATELET - Abnormal; Notable for the following components:      Result Value   RBC 3.62 (*)    Hemoglobin 10.9 (*)    HCT 30.9 (*)    All other components within normal limits  BASIC METABOLIC PANEL - Abnormal; Notable for the following components:   Sodium 129 (*)    Chloride 91 (*)    BUN 34 (*)    Creatinine, Ser 1.39 (*)    GFR calc non Af Amer 34 (*)    GFR calc Af Amer 39 (*)    All other components within normal limits    EKG None  Radiology Dg Ankle Complete Left  Result Date: 07/28/2017 CLINICAL DATA:  Pain and swelling. EXAM: LEFT ANKLE COMPLETE - 3+ VIEW COMPARISON:  None. FINDINGS: Generalized osteopenia. No acute fracture or dislocation. Normal ankle mortise. Mild soft tissue swelling around the ankle. Small plantar calcaneal spur. Enthesopathic changes  of the Achilles tendon insertion. IMPRESSION: No acute osseous injury of the left ankle. Electronically Signed   By: Kathreen Devoid   On: 07/28/2017 10:37   US Venous Img Lower Bilateral  Result Date: 07/28/2017 CLINICAL DATA:  Left ankle pain and swelling. EXAM: BILATERAL LOWER EXTREMITY VENOUS DOPPLER ULTRASOUND TECHNIQUE: Gray-scale sonography with graded compression, as well as color Doppler and duplex ultrasound were performed to evaluate the lower extremity deep venous systems from the level of the common femoral vein and including the common femoral, femoral, profunda femoral, popliteal and calf veins including the posterior tibial, peroneal and gastrocnemius veins when visible. The superficial  great saphenous vein was also interrogated. Spectral Doppler was utilized to evaluate flow at rest and with distal augmentation maneuvers in the common femoral, femoral and popliteal veins. COMPARISON:  11/30/2016. FINDINGS: RIGHT LOWER EXTREMITY Common Femoral Vein: No evidence of thrombus. Normal compressibility, respiratory phasicity and response to augmentation. Saphenofemoral Junction: No evidence of thrombus. Normal compressibility and flow on color Doppler imaging. Profunda Femoral Vein: No evidence of thrombus. Normal compressibility and flow on color Doppler imaging. Femoral Vein: No evidence of thrombus. Normal compressibility, respiratory phasicity and response to augmentation. Popliteal Vein: No evidence of thrombus. Normal compressibility, respiratory phasicity and response to augmentation. Calf Veins: Limited visualization due to body habitus. Superficial Great Saphenous Vein: No evidence of thrombus. Normal compressibility. Venous Reflux:  None. Other Findings:  None. LEFT LOWER EXTREMITY Common Femoral Vein: No evidence of thrombus. Normal compressibility, respiratory phasicity and response to augmentation. Saphenofemoral Junction: No evidence of thrombus. Normal compressibility and flow on color  Doppler imaging. Profunda Femoral Vein: No evidence of thrombus. Normal compressibility and flow on color Doppler imaging. Femoral Vein: No evidence of thrombus. Normal compressibility, respiratory phasicity and response to augmentation. Popliteal Vein: No evidence of thrombus. Normal compressibility, respiratory phasicity and response to augmentation. Calf Veins: Limited visualization due to body habitus. Superficial Great Saphenous Vein: No evidence of thrombus. Normal compressibility. Venous Reflux:  None. Other Findings: Complex fluid collection within the medial popliteal fossa measuring 3.2 x 1.6 x 2.1 cm. IMPRESSION: 1. No evidence for DVT. 2. Left popliteal fossa fluid collection compatible with Baker's cyst. Electronically Signed   By: Kerby Moors M.D.   On: 07/28/2017 13:29    Procedures Procedures (including critical care time)  Medications Ordered in ED Medications  acetaminophen (TYLENOL) tablet 650 mg (650 mg Oral Given 07/28/17 1155)     Initial Impression / Assessment and Plan / ED Course  I have reviewed the triage vital signs and the nursing notes.  Pertinent labs & imaging results that were available during my care of the patient were reviewed by me and considered in my medical decision making (see chart for details).     Patient presenting with bilateral ankle pain and swelling, acute onset this morning.  No recent injuries, no symptoms of CHF.  Exam is reassuring, no indication for septic arthritis or gout. NV intact. Venous ultrasound performed of bilateral lower extremities; negative for DVT.  Labs appear to be at patient's baseline, if not improved in kidney function.  Patient is well-appearing, not in distress, safe for discharge with symptomatic management and PCP follow-up.  Patient discussed with and seen by Dr. Sherry Ruffing, who agrees with care plan.  Discussed results, findings, treatment and follow up. Patient advised of return precautions. Patient verbalized  understanding and agreed with plan.   Final Clinical Impressions(s) / ED Diagnoses   Final diagnoses:  Bilateral swelling of feet and ankles    ED Discharge Orders    None       Aryon Nham, [place], [place] 07/28/17 1443    Tegeler, Gwenyth Allegra, MD 07/29/17 [phone]

## 2017-07-28 NOTE — ED Notes (Signed)
Pt in US

## 2017-07-28 NOTE — ED Triage Notes (Addendum)
L ankle pain and swelling since yesterday. Denies specific injury but states she did have a fall about 3 weeks ago.

## 2019-02-12 IMAGING — DX DG CHEST 2V
2 series · 2 of 2 positions shown · non-contrast
Comparison: None.

CLINICAL DATA: Left chest pain for 2 days.

EXAM:
CHEST  2 VIEW

[chest lat]
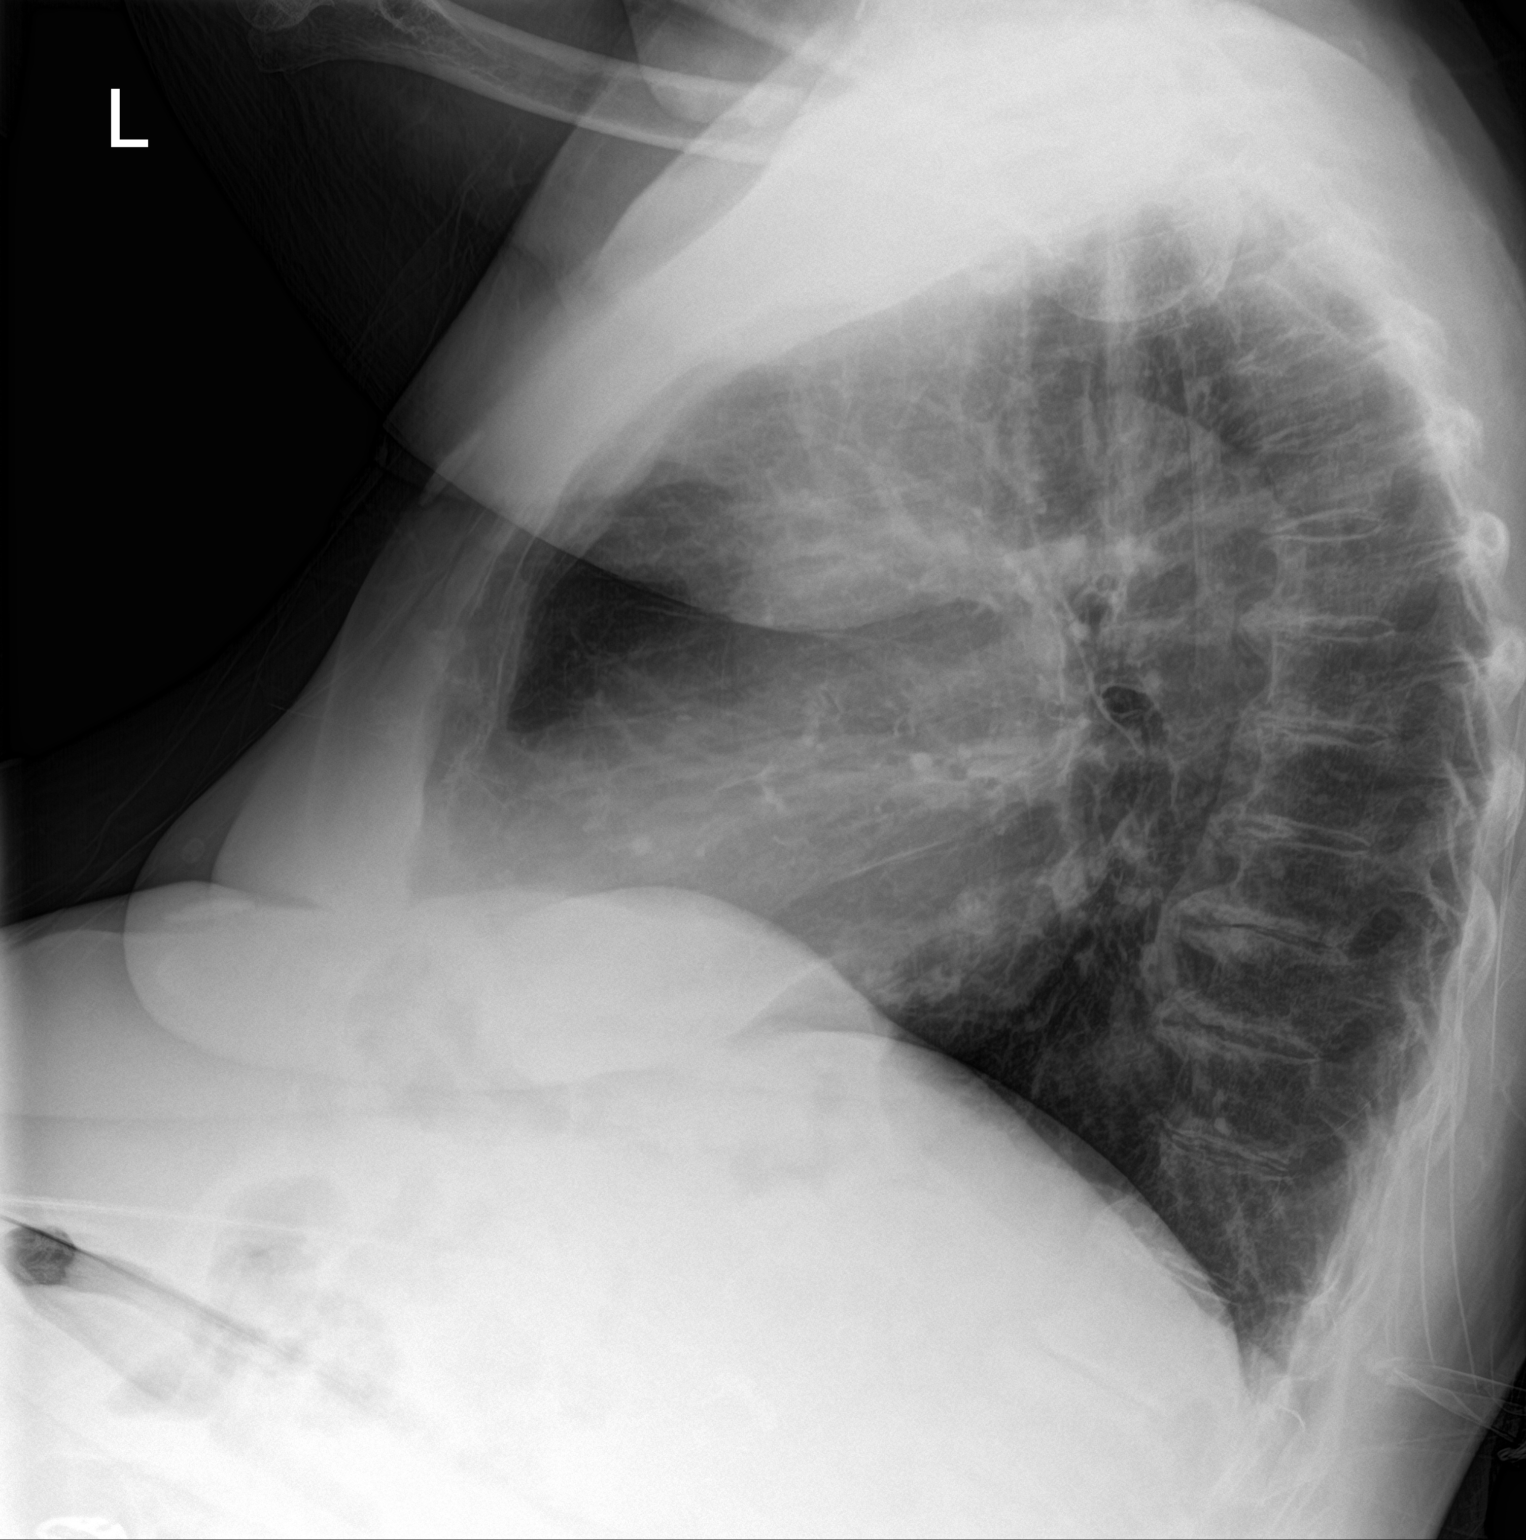

[chest ap strecther]
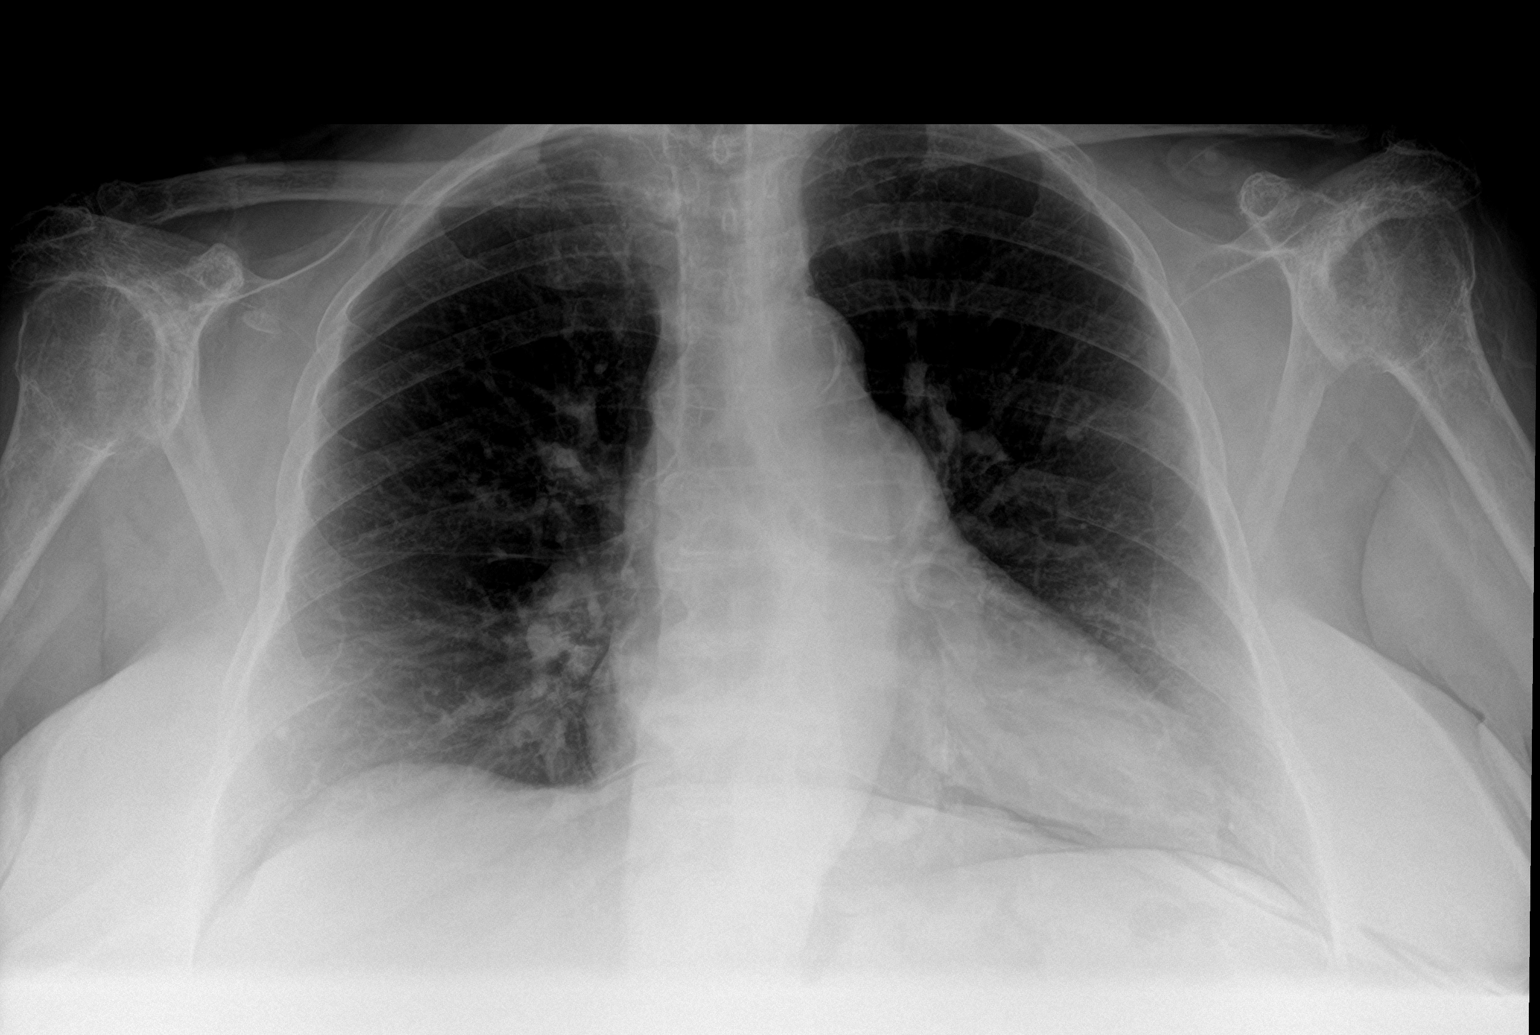

[2 of 2 positions shown; findings below may reference images not displayed]

FINDINGS: Mild cardiomegaly. Lungs are clear. No effusions or edema.
Degenerative changes in the shoulders and thoracic spine. No acute
bony abnormality.
IMPRESSION: Cardiomegaly.  No active disease.

## 2019-11-28 ENCOUNTER — Emergency Department (HOSPITAL_BASED_OUTPATIENT_CLINIC_OR_DEPARTMENT_OTHER): Payer: Medicare Other

## 2019-11-28 ENCOUNTER — Encounter (HOSPITAL_BASED_OUTPATIENT_CLINIC_OR_DEPARTMENT_OTHER): Payer: Self-pay | Admitting: Emergency Medicine

## 2019-11-28 ENCOUNTER — Emergency Department (HOSPITAL_BASED_OUTPATIENT_CLINIC_OR_DEPARTMENT_OTHER)
Admission: EM | Admit: 2019-11-28 | Discharge: 2019-11-28 | Disposition: A | Discharge status: Home / Self Care | Payer: Medicare Other | Attending: Emergency Medicine | Admitting: Emergency Medicine

## 2019-11-28 DIAGNOSIS — S0990XA Unspecified injury of head, initial encounter: Secondary | ICD-10-CM | POA: Diagnosis not present

## 2019-11-28 DIAGNOSIS — S40022A Contusion of left upper arm, initial encounter: Secondary | ICD-10-CM | POA: Diagnosis not present

## 2019-11-28 DIAGNOSIS — Z7951 Long term (current) use of inhaled steroids: Secondary | ICD-10-CM | POA: Diagnosis not present

## 2019-11-28 DIAGNOSIS — I6782 Cerebral ischemia: Secondary | ICD-10-CM | POA: Insufficient documentation

## 2019-11-28 DIAGNOSIS — E119 Type 2 diabetes mellitus without complications: Secondary | ICD-10-CM | POA: Diagnosis not present

## 2019-11-28 DIAGNOSIS — M19011 Primary osteoarthritis, right shoulder: Secondary | ICD-10-CM | POA: Insufficient documentation

## 2019-11-28 DIAGNOSIS — W01198A Fall on same level from slipping, tripping and stumbling with subsequent striking against other object, initial encounter: Secondary | ICD-10-CM | POA: Diagnosis not present

## 2019-11-28 DIAGNOSIS — Y929 Unspecified place or not applicable: Secondary | ICD-10-CM | POA: Diagnosis not present

## 2019-11-28 DIAGNOSIS — I10 Essential (primary) hypertension: Secondary | ICD-10-CM | POA: Insufficient documentation

## 2019-11-28 DIAGNOSIS — I700 Atherosclerosis of aorta: Secondary | ICD-10-CM | POA: Diagnosis not present

## 2019-11-28 DIAGNOSIS — M19012 Primary osteoarthritis, left shoulder: Secondary | ICD-10-CM | POA: Diagnosis not present

## 2019-11-28 DIAGNOSIS — R011 Cardiac murmur, unspecified: Secondary | ICD-10-CM | POA: Insufficient documentation

## 2019-11-28 DIAGNOSIS — W19XXXA Unspecified fall, initial encounter: Secondary | ICD-10-CM

## 2019-11-28 DIAGNOSIS — Z7982 Long term (current) use of aspirin: Secondary | ICD-10-CM | POA: Insufficient documentation

## 2019-11-28 DIAGNOSIS — M25551 Pain in right hip: Secondary | ICD-10-CM | POA: Insufficient documentation

## 2019-11-28 DIAGNOSIS — J45909 Unspecified asthma, uncomplicated: Secondary | ICD-10-CM | POA: Diagnosis not present

## 2019-11-28 DIAGNOSIS — Z8673 Personal history of transient ischemic attack (TIA), and cerebral infarction without residual deficits: Secondary | ICD-10-CM | POA: Insufficient documentation

## 2019-11-28 DIAGNOSIS — Y939 Activity, unspecified: Secondary | ICD-10-CM | POA: Insufficient documentation

## 2019-11-28 DIAGNOSIS — Z79899 Other long term (current) drug therapy: Secondary | ICD-10-CM | POA: Insufficient documentation

## 2019-11-28 DIAGNOSIS — Y999 Unspecified external cause status: Secondary | ICD-10-CM | POA: Insufficient documentation

## 2019-11-28 DIAGNOSIS — N3001 Acute cystitis with hematuria: Secondary | ICD-10-CM | POA: Diagnosis not present

## 2019-11-28 DIAGNOSIS — E041 Nontoxic single thyroid nodule: Secondary | ICD-10-CM | POA: Diagnosis not present

## 2019-11-28 DIAGNOSIS — M546 Pain in thoracic spine: Secondary | ICD-10-CM | POA: Insufficient documentation

## 2019-11-28 DIAGNOSIS — M170 Bilateral primary osteoarthritis of knee: Secondary | ICD-10-CM | POA: Diagnosis not present

## 2019-11-28 DIAGNOSIS — M542 Cervicalgia: Secondary | ICD-10-CM | POA: Insufficient documentation

## 2019-11-28 DIAGNOSIS — M25552 Pain in left hip: Secondary | ICD-10-CM | POA: Diagnosis not present

## 2019-11-28 DIAGNOSIS — S40922A Unspecified superficial injury of left upper arm, initial encounter: Secondary | ICD-10-CM | POA: Diagnosis present

## 2019-11-28 LAB — URINALYSIS, ROUTINE W REFLEX MICROSCOPIC
Bilirubin Urine: NEGATIVE
Glucose, UA: NEGATIVE mg/dL
Ketones, ur: NEGATIVE mg/dL
Nitrite: NEGATIVE
Protein, ur: NEGATIVE mg/dL
Specific Gravity, Urine: 1.015 (ref 1.005–1.030)
pH: 7.5 (ref 5.0–8.0)

## 2019-11-28 LAB — CBC WITH DIFFERENTIAL/PLATELET
Abs Immature Granulocytes: 0.02 10*3/uL (ref 0.00–0.07)
Basophils Absolute: 0 10*3/uL (ref 0.0–0.1)
Basophils Relative: 1 %
Eosinophils Absolute: 0.3 10*3/uL (ref 0.0–0.5)
Eosinophils Relative: 4 %
HCT: 38.5 % (ref 36.0–46.0)
Hemoglobin: 12.6 g/dL (ref 12.0–15.0)
Immature Granulocytes: 0 %
Lymphocytes Relative: 27 %
Lymphs Abs: 1.8 10*3/uL (ref 0.7–4.0)
MCH: 29.6 pg (ref 26.0–34.0)
MCHC: 32.7 g/dL (ref 30.0–36.0)
MCV: 90.4 fL (ref 80.0–100.0)
Monocytes Absolute: 0.6 10*3/uL (ref 0.1–1.0)
Monocytes Relative: 9 %
Neutro Abs: 4 10*3/uL (ref 1.7–7.7)
Neutrophils Relative %: 59 %
Platelets: 183 10*3/uL (ref 150–400)
RBC: 4.26 MIL/uL (ref 3.87–5.11)
RDW: 13.2 % (ref 11.5–15.5)
WBC: 6.7 10*3/uL (ref 4.0–10.5)
nRBC: 0 % (ref 0.0–0.2)

## 2019-11-28 LAB — COMPREHENSIVE METABOLIC PANEL
ALT: 13 U/L (ref 0–44)
AST: 41 U/L (ref 15–41)
Albumin: 3.7 g/dL (ref 3.5–5.0)
Alkaline Phosphatase: 71 U/L (ref 38–126)
Anion gap: 12 (ref 5–15)
BUN: 17 mg/dL (ref 8–23)
CO2: 27 mmol/L (ref 22–32)
Calcium: 8.9 mg/dL (ref 8.9–10.3)
Chloride: 95 mmol/L — ABNORMAL LOW (ref 98–111)
Creatinine, Ser: 1.3 mg/dL — ABNORMAL HIGH (ref 0.44–1.00)
GFR calc Af Amer: 43 mL/min — ABNORMAL LOW (ref >60)
GFR calc non Af Amer: 37 mL/min — ABNORMAL LOW (ref >60)
Glucose, Bld: 145 mg/dL — ABNORMAL HIGH (ref 70–99)
Potassium: 5.3 mmol/L — ABNORMAL HIGH (ref 3.5–5.1)
Sodium: 134 mmol/L — ABNORMAL LOW (ref 135–145)
Total Bilirubin: 1.3 mg/dL — ABNORMAL HIGH (ref 0.3–1.2)
Total Protein: 7.7 g/dL (ref 6.5–8.1)

## 2019-11-28 LAB — URINALYSIS, MICROSCOPIC (REFLEX)

## 2019-11-28 LAB — TROPONIN I (HIGH SENSITIVITY)
Troponin I (High Sensitivity): 22 ng/L — ABNORMAL HIGH (ref <18)
Troponin I (High Sensitivity): 26 ng/L — ABNORMAL HIGH (ref <18)

## 2019-11-28 MED ORDER — METOPROLOL TARTRATE 50 MG PO TABS
50.0000 mg | ORAL_TABLET | Freq: Once | ORAL | Status: AC
  Administered 2019-11-28: 50 mg via ORAL
  Filled 2019-11-28: qty 1

## 2019-11-28 MED ORDER — FOSFOMYCIN TROMETHAMINE 3 G PO PACK
3.0000 g | PACK | Freq: Once | ORAL | Status: AC
  Administered 2019-11-28: 3 g via ORAL
  Filled 2019-11-28: qty 3

## 2019-11-28 NOTE — ED Provider Notes (Signed)
[facility] EMERGENCY DEPARTMENT Provider Note   CSN: [id number] Arrival date & time: 11/28/19  1218     History Chief Complaint  Patient presents with  . Fall    Nicole Doyle is a 86 y.o. female with past medical history of DM, stroke, thyroid disease, memory loss/dementia, who presents today for evaluation after a fall.  She is not sure why she fell, denies tripping over anything.  Thinks it may have been that she turned quickly however is unsure.  She does not think she had any prodromal symptoms however her daughter does note memory loss.  She states that she struck her head on a corner.  She has pain in her head and her neck.  She reports pain in her mid back at about the level of her waist along with pain in her bilateral shoulders, hips, and knees.  She has been ambulatory since the fall with her walker (baseline).   HPI     Past Medical History:  Diagnosis Date  . Asthma   . Colon polyps   . Diabetes mellitus without complication (HCC)    Type 2  . Hypertension   . Lipoma of abdominal wall   . Stroke ([place])   . Thyroid disease    Radioactive iodine    There are no problems to display for this patient.   Past Surgical History:  Procedure Laterality Date  . CAROTID ARTERY ANGIOPLASTY    . CARPAL TUNNEL RELEASE    . CHOLECYSTECTOMY    . HEMORRHOID SURGERY    . LIPOMA EXCISION       OB History   No obstetric history on file.     No family history on file.  Social History   Tobacco Use  . Smoking status: Never Smoker  . Smokeless tobacco: Never Used  Substance Use Topics  . Alcohol use: No  . Drug use: No    Home Medications Prior to Admission medications   Medication Sig Start Date End Date Taking? Authorizing Provider  aspirin EC 81 MG tablet Take 81 mg by mouth daily.    [provider]  budesonide-formoterol (SYMBICORT) 160-4.5 MCG/ACT inhaler Inhale 2 puffs into the lungs 2 (two) times daily.    [provider]    cloNIDine (CATAPRES) 0.1 MG tablet Take 0.1 mg by mouth daily.    [provider]  clopidogrel (PLAVIX) 75 MG tablet Take 75 mg by mouth once.    [provider]  cycloSPORINE (RESTASIS) 0.05 % ophthalmic emulsion Place 1 drop into both eyes 2 (two) times daily.    [provider]  FLUoxetine (PROZAC) 10 MG tablet Take 10 mg by mouth daily.    [provider]  hydrochlorothiazide (MICROZIDE) 12.5 MG capsule Take 12.5 mg by mouth daily.    [provider]  loperamide (IMODIUM) 2 MG capsule Take by mouth as needed for diarrhea or loose stools.    [provider]  loratadine (CLARITIN) 10 MG tablet Take 5 mg by mouth 2 (two) times daily.    [provider]  losartan (COZAAR) 100 MG tablet Take 100 mg by mouth daily.    [provider]  meclizine (ANTIVERT) 25 MG tablet Take 1 tablet (25 mg total) by mouth 3 (three) times daily as needed for dizziness. 07/14/16   Fredia Sorrow, MD  metoprolol (LOPRESSOR) 50 MG tablet Take 50 mg by mouth 2 (two) times daily.    [provider]  montelukast (SINGULAIR) 10 MG tablet  Take 10 mg by mouth at bedtime.    [provider]  Multiple Vitamin (MULTIVITAMIN) tablet Take 1 tablet by mouth daily.    [provider]  Olopatadine HCl (PATANASE) 0.6 % SOLN Place 1 each into the nose every morning.    [provider]  pantoprazole (PROTONIX) 40 MG tablet Take 40 mg by mouth daily.    [provider]  senna-docusate (SENOKOT-S) 8.6-50 MG tablet Take 1 tablet by mouth at bedtime.    [provider]    Allergies    Darvon [propoxyphene], Fentanyl, Morphine and related, and Penicillins  Review of Systems   Review of Systems  Constitutional: Negative for chills and fever.  HENT: Negative for congestion.   Respiratory: Negative for shortness of breath.   Cardiovascular: Negative for chest pain.  Gastrointestinal: Negative for abdominal pain,  diarrhea, nausea and vomiting.  Genitourinary: Negative for dysuria.  Musculoskeletal: Positive for back pain and neck pain.  Skin: Negative for color change, rash and wound.  Neurological: Negative for weakness and headaches.  Psychiatric/Behavioral: Positive for confusion (Baseline).  All other systems reviewed and are negative.   Physical Exam Updated Vital Signs BP (!) 182/109 (BP Location: Right Wrist)   Pulse 56   Temp 98.4 F (36.9 C) (Oral)   Resp 13   SpO2 100%   Physical Exam Vitals and nursing note reviewed.  Constitutional:      General: She is not in acute distress.    Appearance: She is well-developed. She is not diaphoretic.  HENT:     Head: Normocephalic.     Comments: Ecchymosis present Right sided forehead.  Eyes:     General: No scleral icterus.       Right eye: No discharge.        Left eye: No discharge.     Conjunctiva/sclera: Conjunctivae normal.  Neck:     Comments: Diffuse midline neck pain, ROM not tested. Cardiovascular:     Rate and Rhythm: Normal rate and regular rhythm.     Pulses: Normal pulses.     Heart sounds: Murmur heard.   Pulmonary:     Effort: Pulmonary effort is normal. No respiratory distress.     Breath sounds: No stridor.  Abdominal:     General: There is no distension.  Musculoskeletal:        General: No deformity.     Right lower leg: No edema.     Left lower leg: No edema.     Comments: There is diffuse pain in bilateral shoulders, hips, knees.  There is pain through her entire back and spine.  No obvious acute deformities on physical exam.    Skin:    General: Skin is warm and dry.     Comments: Contusion present left upper arm.   Neurological:     Mental Status: She is alert.     Motor: No abnormal muscle tone.     Comments: 4/5 strength bilateral upper and lower extremities.  She is awake and alert.  Cognition is at baseline according to family member.  Psychiatric:        Behavior: Behavior normal.         Thought Content: Thought content normal.     ED Results / Procedures / Treatments   Labs (all labs ordered are listed, but only abnormal results are displayed) Labs Reviewed  URINALYSIS, ROUTINE W REFLEX MICROSCOPIC - Abnormal; Notable for the following components:      Result Value  Color, Urine STRAW (*)    Hgb urine dipstick TRACE (*)    Leukocytes,Ua SMALL (*)    All other components within normal limits  COMPREHENSIVE METABOLIC PANEL - Abnormal; Notable for the following components:   Sodium 134 (*)    Potassium 5.3 (*)    Chloride 95 (*)    Glucose, Bld 145 (*)    Creatinine, Ser 1.30 (*)    Total Bilirubin 1.3 (*)    GFR calc non Af Amer 37 (*)    GFR calc Af Amer 43 (*)    All other components within normal limits  URINALYSIS, MICROSCOPIC (REFLEX) - Abnormal; Notable for the following components:   Bacteria, UA MANY (*)    All other components within normal limits  TROPONIN I (HIGH SENSITIVITY) - Abnormal; Notable for the following components:   Troponin I (High Sensitivity) 22 (*)    All other components within normal limits  TROPONIN I (HIGH SENSITIVITY) - Abnormal; Notable for the following components:   Troponin I (High Sensitivity) 26 (*)    All other components within normal limits  CBC WITH DIFFERENTIAL/PLATELET    EKG None  Radiology DG Chest 1 View  Result Date: 11/28/2019 CLINICAL DATA:  Fall EXAM: CHEST  1 VIEW COMPARISON:  Radiograph 06/06/2017 FINDINGS: No consolidation, features of edema, pneumothorax, or effusion. Scattered calcified granulomata. Calcified hilar adenopathy similar to comparison is. The aorta is calcified. Cardiac silhouette is stable. The remaining cardiomediastinal contours are unremarkable. Severe arthrosis in the bilateral shoulders similar to progressive from comparison with calcified loose body in the right shoulder recess similar to comparison. No acute osseous abnormality or suspicious osseous lesion. IMPRESSION: 1. No acute  cardiopulmonary or traumatic findings in the chest. 2. Severe arthrosis in the bilateral shoulders, similar to progressive from comparison. 3. Stable sequela of likely remote granulomatous disease. 4.  Aortic Atherosclerosis (ICD10-I70.0). Electronically Signed   By: Lovena Le M.D.   On: 11/28/2019 17:40   DG Thoracic Spine 2 View  Result Date: 11/28/2019 CLINICAL DATA:  Fall EXAM: THORACIC SPINE 2 VIEWS COMPARISON:  Same day CT cervical spine FINDINGS: The osseous structures appear diffusely demineralized which may limit detection of small or nondisplaced fractures. Exaggerated thoracic kyphosis. Mild levocurvature of the thoracic spine with an apex at the T10 level and dextrocurvature of the lumbar spine better detailed on lumbar radiographs. No visible acute vertebral body fracture or height loss though the upper thoracic vertebrae are poorly visualized on lateral radiograph none though better seen on same day CT cervical spine without acute abnormality. IMPRESSION: 1. No visible acute vertebral body fracture or height loss. Bony demineralization may limit detection of more subtle nondisplaced fractures. 2. Upper thoracic vertebrae (T1-T4) are poorly visualized on lateral radiograph, better seen on same day CT cervical spine without acute traumatic osseous injury evident 3. Exaggerated thoracic kyphosis and mild levocurvature of the thoracic spine. Electronically Signed   By: Lovena Le M.D.   On: 11/28/2019 17:42   DG Lumbar Spine Complete  Result Date: 11/28/2019 CLINICAL DATA:  Fall EXAM: LUMBAR SPINE - COMPLETE 4+ VIEW COMPARISON:  Radiograph 02/26/2019 FINDINGS: Six non-rib-bearing vertebral bodies, lowest fully formed disc space denoted as L6-S1 with partial sacralization of the left L6 transverse process and pseudoarticulation with the sacral ala. No clear acute fracture vertebral body height loss is evident though evaluation is limited by the diffuse bony demineralization, marked  levocurvature of the lumbar spine as well as advanced multilevel discogenic and facet degenerative changes. Curvature of  the spine with an apex at the L2 level. Interspinous arthrosis compatible with Baastrup's disease. Additional degenerative changes in the bony pelvis and bilateral hips. Extensive vascular calcium in the abdomen and pelvis. More coarse calcification in the midline pelvis possibly reflecting calcified fibroid. Cholecystectomy clips in the right upper quadrant. IMPRESSION: 1. No clear acute fracture vertebral body height loss though evaluation is limited by the diffuse bony demineralization, scoliosis, and multilevel advanced degenerative changes. If persisting clinical concern, recommend low threshold for cross-sectional imaging. 2. Extensive vascular calcium in the abdomen and pelvis. 3. Possible calcified uterine fibroid. Electronically Signed   By: Lovena Le M.D.   On: 11/28/2019 17:45   DG Shoulder Right  Result Date: 11/28/2019 CLINICAL DATA:  Fall EXAM: RIGHT SHOULDER - 2+ VIEW COMPARISON:  Chest radiograph 06/06/2017 FINDINGS: Severe glenohumeral and acromioclavicular arthrosis with complete effacement across the acromioclavicular and glenohumeral articulations. Flattening of the humeral head may reflect sequela of prior avascular necrosis as well. Coarse mineralization seen inferomedial to the coracoid likely reflecting a joint body within the shoulder recess. Mild periosteal elevation along the anterolateral cortex of the proximal humeral diaphysis. May reflect a tug lesion or less likely stress related change. Aggressive osseous lesion is significantly less favored. IMPRESSION: 1. No acute fracture or traumatic malalignment. 2. Severe glenohumeral and acromioclavicular arthrosis with complete effacement across the acromioclavicular and glenohumeral articulations. Flattening of the humeral head may reflect sequela of avascular necrosis. 3. Mild periosteal change along the proximal  humeral diaphysis most likely reflective of a tug lesion/enthesopathic change in this location. 4. Probable joint body within the shoulder recess. Electronically Signed   By: Lovena Le M.D.   On: 11/28/2019 17:49   CT Head Wo Contrast  Result Date: 11/28/2019 CLINICAL DATA:  Fall, no loss of consciousness or reported head strike EXAM: CT HEAD WITHOUT CONTRAST CT CERVICAL SPINE WITHOUT CONTRAST TECHNIQUE: Multidetector CT imaging of the head and cervical spine was performed following the standard protocol without intravenous contrast. Multiplanar CT image reconstructions of the cervical spine were also generated. COMPARISON:  CT head 06/06/2017, MRI 11/10/2019 FINDINGS: CT HEAD FINDINGS Brain: In No evidence of acute infarction, hemorrhage, hydrocephalus, extra-axial collection or mass lesion/mass effect. Symmetric prominence of the ventricles, cisterns and sulci compatible with parenchymal volume loss. Patchy areas of white matter hypoattenuation are most compatible with chronic microvascular angiopathy. Vascular: Atherosclerotic calcification of the carotid siphons and intradural vertebral arteries. No hyperdense vessel. Skull: No calvarial fracture or suspicious osseous lesion. No scalp swelling or hematoma. Benign hyperostosis frontalis interna. Sinuses/Orbits: Paranasal sinuses and mastoid air cells are predominantly clear. Orbital structures are unremarkable aside from prior lens extractions. Other: None CT CERVICAL SPINE FINDINGS Alignment: Preservation of the normal cervical lordosis. Stepwise anterolisthesis C3-C7, likely on a degenerative basis with multilevel cervical spondylitic and facet degenerative changes. No evidence of traumatic listhesis, specifically no abnormally widened, perched or jumped facets. Normal alignment of the craniocervical and atlantoaxial articulations accounting for mild leftward cranial rotation. Skull base and vertebrae: The osseous structures appear diffusely  demineralized which may limit detection of small or nondisplaced fractures. No acute skull base fracture. No vertebral body fracture or height loss. Advanced arthrosis at the C1-2 articulation including calcific pannus formation posterior to the dens. No suspicious osseous lesions. Soft tissues and spinal canal: Pannus formation posterior to the dens, as above. No pre or paravertebral fluid or swelling. No visible canal hematoma. Disc levels: Multilevel stepwise anterolisthesis, cervical spondylitic changes as well as facet hypertrophy and uncinate spurring.  Findings result in at most mild canal stenosis maximal at the C4-5 level. Uncinate and facet degenerative changes result in mild-to-moderate multilevel neural foraminal narrowing as well. No severe canal stenosis or foraminal impingement. Upper chest: No acute abnormality in the lung apices. Postsurgical changes in the left neck may reflect prior endarterectomy. Extensive atherosclerotic calcification in the proximal great vessels and cervical carotids. Other: Heterogeneous, multinodular thyroid gland including a larger 2.9 cm nodule in the right lobe thyroid. IMPRESSION: 1. No acute intracranial abnormality. No scalp swelling or calvarial fracture. 2. Chronic microvascular ischemic changes and parenchymal volume loss. 3. No acute fracture or traumatic listhesis of the cervical spine. 4. Multilevel cervical spondylitic and facet degenerative changes. 5. Heterogeneous, multinodular thyroid gland including a larger 2.9 cm nodule in the right lobe thyroid. Decision for further imaging should be based upon consideration of patient's comorbidities and advanced age. This follows consensus guidelines: Managing Incidental Thyroid Nodules Detected on Imaging: White Paper of the ACR Incidental Thyroid Findings Committee. J Am Coll Radiol 2015; 12:143-150. and [employer] 3-tiered system for managing ITNs: J Am Coll Radiol. 2015; Feb;12(2): 143-50 6. Cervical and intracranial  atherosclerosis. Electronically Signed   By: Lovena Le M.D.   On: 11/28/2019 17:13   CT Cervical Spine Wo Contrast  Result Date: 11/28/2019 CLINICAL DATA:  Fall, no loss of consciousness or reported head strike EXAM: CT HEAD WITHOUT CONTRAST CT CERVICAL SPINE WITHOUT CONTRAST TECHNIQUE: Multidetector CT imaging of the head and cervical spine was performed following the standard protocol without intravenous contrast. Multiplanar CT image reconstructions of the cervical spine were also generated. COMPARISON:  CT head 06/06/2017, MRI 11/10/2019 FINDINGS: CT HEAD FINDINGS Brain: In No evidence of acute infarction, hemorrhage, hydrocephalus, extra-axial collection or mass lesion/mass effect. Symmetric prominence of the ventricles, cisterns and sulci compatible with parenchymal volume loss. Patchy areas of white matter hypoattenuation are most compatible with chronic microvascular angiopathy. Vascular: Atherosclerotic calcification of the carotid siphons and intradural vertebral arteries. No hyperdense vessel. Skull: No calvarial fracture or suspicious osseous lesion. No scalp swelling or hematoma. Benign hyperostosis frontalis interna. Sinuses/Orbits: Paranasal sinuses and mastoid air cells are predominantly clear. Orbital structures are unremarkable aside from prior lens extractions. Other: None CT CERVICAL SPINE FINDINGS Alignment: Preservation of the normal cervical lordosis. Stepwise anterolisthesis C3-C7, likely on a degenerative basis with multilevel cervical spondylitic and facet degenerative changes. No evidence of traumatic listhesis, specifically no abnormally widened, perched or jumped facets. Normal alignment of the craniocervical and atlantoaxial articulations accounting for mild leftward cranial rotation. Skull base and vertebrae: The osseous structures appear diffusely demineralized which may limit detection of small or nondisplaced fractures. No acute skull base fracture. No vertebral body fracture  or height loss. Advanced arthrosis at the C1-2 articulation including calcific pannus formation posterior to the dens. No suspicious osseous lesions. Soft tissues and spinal canal: Pannus formation posterior to the dens, as above. No pre or paravertebral fluid or swelling. No visible canal hematoma. Disc levels: Multilevel stepwise anterolisthesis, cervical spondylitic changes as well as facet hypertrophy and uncinate spurring. Findings result in at most mild canal stenosis maximal at the C4-5 level. Uncinate and facet degenerative changes result in mild-to-moderate multilevel neural foraminal narrowing as well. No severe canal stenosis or foraminal impingement. Upper chest: No acute abnormality in the lung apices. Postsurgical changes in the left neck may reflect prior endarterectomy. Extensive atherosclerotic calcification in the proximal great vessels and cervical carotids. Other: Heterogeneous, multinodular thyroid gland including a larger 2.9 cm nodule in the right  lobe thyroid. IMPRESSION: 1. No acute intracranial abnormality. No scalp swelling or calvarial fracture. 2. Chronic microvascular ischemic changes and parenchymal volume loss. 3. No acute fracture or traumatic listhesis of the cervical spine. 4. Multilevel cervical spondylitic and facet degenerative changes. 5. Heterogeneous, multinodular thyroid gland including a larger 2.9 cm nodule in the right lobe thyroid. Decision for further imaging should be based upon consideration of patient's comorbidities and advanced age. This follows consensus guidelines: Managing Incidental Thyroid Nodules Detected on Imaging: White Paper of the ACR Incidental Thyroid Findings Committee. J Am Coll Radiol 2015; 12:143-150. and [facility] 3-tiered system for managing ITNs: J Am Coll Radiol. 2015; Feb;12(2): 143-50 6. Cervical and intracranial atherosclerosis. Electronically Signed   By: Lovena Le M.D.   On: 11/28/2019 17:13   DG Shoulder Left  Result Date:  11/28/2019 CLINICAL DATA:  Fall EXAM: LEFT SHOULDER - 2+ VIEW COMPARISON:  Radiograph 06/16/2018 FINDINGS: Severe glenohumeral arthrosis with a centrally bone-on-bone contact. More moderate to severe arthrosis of the acromioclavicular interval with soft tissue mineralization which could reflect some hydroxyapatite or calcium pyrophosphate deposition. No acute fracture or traumatic malalignment is seen. Enthesopathic changes noted along the proximal anterolateral humeral diaphysis. Some lateral soft tissue swelling is noted. Postsurgical changes noted in the base the left neck. Included portion of the left chest wall is unremarkable. Atherosclerotic calcification in the aorta noted incidentally. IMPRESSION: 1. No acute fracture or traumatic malalignment. Lateral soft tissue swelling, likely change. 2. Severe glenohumeral arthrosis with bone-on-bone contact. 3. Moderate to severe arthrosis of the acromioclavicular interval with soft tissue mineralization which could reflect some hydroxyapatite or calcium pyrophosphate deposition. 4. Enthesopathic change noted along the lateral proximal humeral diaphysis. 5.  Aortic Atherosclerosis (ICD10-I70.0). Electronically Signed   By: Lovena Le M.D.   On: 11/28/2019 17:52   DG Knee Complete 4 Views Left  Result Date: 11/28/2019 CLINICAL DATA:  Bilateral knee pain EXAM: LEFT KNEE - COMPLETE 4+ VIEW COMPARISON:  None. FINDINGS: The osseous structures appear diffusely demineralized which may limit detection of small or nondisplaced fractures. No clear acute fracture or traumatic malalignment is seen. Severe degenerative tricompartmental osteoarthrosis. Chondrocalcinosis of the bilateral menisci. Small periarticular erosion along the lateral tibial plateau. Extensive vascular calcium in the soft tissues. No sizeable joint effusion. Mild diffuse edematous changes without focal swelling. IMPRESSION: 1. No acute fracture or traumatic malalignment. 2. Severe degenerative  tricompartmental osteoarthrosis. 3. Small periarticular erosion along the lateral tibial plateau, could reflect an underlying arthropathy with additional features of chondrocalcinosis suggesting CPPD. 4. Mild diffuse edematous changes without focal swelling. Electronically Signed   By: Lovena Le M.D.   On: 11/28/2019 17:59   DG Knee Complete 4 Views Right  Result Date: 11/28/2019 CLINICAL DATA:  Fall, bilateral knee pain EXAM: RIGHT KNEE - COMPLETE 4+ VIEW COMPARISON:  None. FINDINGS: No clear acute fracture or traumatic malalignment though evaluation limited by severe tricompartmental degenerative change and underlying bony demineralization. Features are most pronounced in the patellofemoral compartment with there is extensive periarticular hypertrophic spurring as well as in the medial femorotibial compartment where there is near complete joint effacement and more advanced subchondral sclerotic changes. Some mild chondrocalcinosis is visible suggesting underlying CPPD arthropathy as well. Extensive vascular calcium seen in the soft tissues. No sizeable joint effusion or focal swelling is seen with a background of mild edematous change. IMPRESSION: 1. No clear acute fracture or traumatic malalignment though evaluation is limited by severe tricompartmental degenerative change and diffuse bony demineralization. 2. Chondrocalcinosis suggestive of an  underlying CPPD arthropathy. 3. Extensive atherosclerosis. Electronically Signed   By: Lovena Le M.D.   On: 11/28/2019 18:01   DG Hip Unilat With Pelvis 2-3 Views Right  Result Date: 11/28/2019 CLINICAL DATA:  Fall, bilateral shoulder and knee pain, acute on chronic EXAM: DG HIP (WITH OR WITHOUT PELVIS) 2-3V RIGHT COMPARISON:  Sacral radiographs 02/26/2019 FINDINGS: The osseous structures appear diffusely demineralized which may limit detection of small or nondisplaced fractures. Bones of the pelvis appear grossly intact and congruent with degenerative change  of the SI joints, right greater than left, as well as the symphysis pubis. Likely enthesopathic mineralization along the ischial tuberosities bilaterally. Moderate bilateral hip arthrosis is noted as well, right greater than left. Proximal femora are normally located aside from mild joint space loss. There is some questionable transcortical lucency along the lateral and superior aspect of the right greater trochanter which could reflect some avulsive change near the insertion site of the gluteal musculature which is a fairly common injury in the setting of fall. Proximal femora are otherwise intact. Vascular calcium throughout the pelvis and proximal thighs. Coarse mineralization in the central pelvis may reflect calcified fibroid. IMPRESSION: Questionable transcortical lucency along the superolateral aspect of the right greater trochanter could reflect some avulsive change near the insertion site of the gluteal musculature, fairly common injury in the setting of fall. Correlate for point tenderness and consider cross-sectional imaging if there is clinical ambiguity. Degenerative changes of the hips and pelvis as above. Likely fibroid uterus. Atherosclerosis. Electronically Signed   By: Lovena Le M.D.   On: 11/28/2019 17:57    Procedures Procedures (including critical care time)  Medications Ordered in ED Medications  metoprolol tartrate (LOPRESSOR) tablet 50 mg (50 mg Oral Given 11/28/19 1917)  fosfomycin (MONUROL) packet 3 g (3 g Oral Given 11/28/19 2219)    ED Course  I have reviewed the triage vital signs and the nursing notes.  Pertinent labs & imaging results that were available during my care of the patient were reviewed by me and considered in my medical decision making (see chart for details).    MDM Rules/Calculators/A&P                         Patient is an 86 year old woman who presents today for evaluation of multiple complaints.  She had a fall for unknown reasons prior to arrival.   She reports diffuse pain in bilateral shoulders, hips, knees.  Additionally she struck her head and reports pain in her neck and her entire back.  On physical exam her hip pain does appear to localize slightly more to the right hip there points that of obtaining complete pelvis x-rays were obtained right hip with pelvis.  CT head and neck were obtained without evidence of acute abnormality.  Thyroid nodule is noted and follow-up instructions given.  There are extensive degenerative/arthritic changes in bilateral shoulders, hips, and knees, this was discussed with patient and family.  Additionally T and L-spine without obvious fracture with extensive degenerative changes.  Patient does have a contusion on her left upper arm however no evidence of compartment syndrome.   There was a possible cortical lucency in the right hip however given the patient is ambulatory with a myriad of degenerative changes and she does not report this pain is any worse than her shoulders or other joints I doubt acute fracture.  Discussed additional evaluation with Dr. Rex Kras who is in agreement.  It is unclear why  she fell.  She is at her baseline according to family in the room. Labs obtained and reviewed potassium is slightly elevated on the labs however hemolysis is noted, I do not suspect that this is a true elevation.  Creatinine is slightly elevated however this appears consistent with her baseline on most recent labs from 2 years ago.  UA does show concern for infection with microscopic exam showing only 0-5 squamous epithelial cells with 6-10 whites, 0-5 reds and many bacteria with white blood cell clumps, and urine dip showing trace blood and small leukocytes.  Her daughter reports issues with getting antibiotic started timely at the nursing facility and expects that patient would miss doses.  Therefore she is given a one-time dose of fosfomycin here.  Recommended close outpatient follow-up with primary care doctor and  returning if fevers or any new or worsening symptoms.  Troponin x2 is flat.  She is not having specific chest pain.  Doubt ACS.  Suspect that this is minimal elevation in the setting of slightly elevated creatinine.  Additionally her chest x-ray is reassuring without pneumonia or evidence of acute abnormalities.  While in the emergency room she was hypertensive.  She was given her home dose of Lopressor after which this improved.  She is advised to take her medicines, other than the home dose of Lopressor, when she gets home.  Return precautions were discussed with patient/family who states their understanding.  At the time of discharge patient denied any unaddressed complaints or concerns.  Patient is agreeable for discharge home.  Note: Portions of this report may have been transcribed using voice recognition software. Every effort was made to ensure accuracy; however, inadvertent computerized transcription errors may be present   Final Clinical Impression(s) / ED Diagnoses Final diagnoses:  Thyroid nodule  Fall, initial encounter  Acute cystitis with hematuria  Contusion of left upper arm, initial encounter    Rx / DC Orders ED Discharge Orders    None       Ollen Gross 11/28/19 2321    Little, Wenda Overland, MD 12/01/19 [phone]

## 2019-11-28 NOTE — ED Triage Notes (Signed)
She fell today while trying to change clothes. C/o bilat  shoulder pain and bilat knee pain. No blood thinners.

## 2019-11-28 NOTE — ED Notes (Signed)
Pt given snack of PB and crackers. Alert. Family at bedside

## 2019-11-28 NOTE — Discharge Instructions (Addendum)
If the thyroid nodule is new and please schedule a follow-up appointment with her primary care doctor for further evaluation. Additionally please schedule a follow-up appointment with the primary care doctor for repeat evaluation in approximately 2 days. If she develops fevers, falls again, or there are any new complaints or concerns needs to be seen sooner.  Today you were given a one-time dose of antibiotics to treat a urinary tract infection.    Your x-rays showed many areas with arthritis and wear and tear type changes without any clear fracture found. X-rays are never 100% therefore if any pain from this last for more than 1 week please seek repeat evaluation by primary care doctor.

## 2019-11-28 NOTE — ED Notes (Signed)
ED Provider at bedside. 

## 2019-11-28 NOTE — ED Notes (Signed)
Pt in radiology 

## 2019-11-28 NOTE — ED Notes (Signed)
ED Provider at bedside. Phylliss Bob, [place]

## 2020-07-11 ENCOUNTER — Encounter (HOSPITAL_BASED_OUTPATIENT_CLINIC_OR_DEPARTMENT_OTHER): Payer: Self-pay | Admitting: Emergency Medicine

## 2020-07-11 ENCOUNTER — Other Ambulatory Visit: Payer: Self-pay

## 2020-07-11 ENCOUNTER — Emergency Department (HOSPITAL_BASED_OUTPATIENT_CLINIC_OR_DEPARTMENT_OTHER)
Admission: EM | Admit: 2020-07-11 | Discharge: 2020-07-11 | Disposition: A | Discharge status: Home / Self Care | Payer: Medicare Other | Attending: Emergency Medicine | Admitting: Emergency Medicine

## 2020-07-11 ENCOUNTER — Emergency Department (HOSPITAL_BASED_OUTPATIENT_CLINIC_OR_DEPARTMENT_OTHER): Payer: Medicare Other

## 2020-07-11 DIAGNOSIS — R600 Localized edema: Secondary | ICD-10-CM | POA: Diagnosis not present

## 2020-07-11 DIAGNOSIS — Z79899 Other long term (current) drug therapy: Secondary | ICD-10-CM | POA: Insufficient documentation

## 2020-07-11 DIAGNOSIS — R519 Headache, unspecified: Secondary | ICD-10-CM | POA: Diagnosis not present

## 2020-07-11 DIAGNOSIS — Z7982 Long term (current) use of aspirin: Secondary | ICD-10-CM | POA: Insufficient documentation

## 2020-07-11 DIAGNOSIS — R300 Dysuria: Secondary | ICD-10-CM | POA: Diagnosis not present

## 2020-07-11 DIAGNOSIS — Z7902 Long term (current) use of antithrombotics/antiplatelets: Secondary | ICD-10-CM | POA: Diagnosis not present

## 2020-07-11 DIAGNOSIS — Z91148 Patient's other noncompliance with medication regimen for other reason: Secondary | ICD-10-CM

## 2020-07-11 DIAGNOSIS — Z7951 Long term (current) use of inhaled steroids: Secondary | ICD-10-CM | POA: Diagnosis not present

## 2020-07-11 DIAGNOSIS — Z9114 Patient's other noncompliance with medication regimen: Secondary | ICD-10-CM | POA: Diagnosis not present

## 2020-07-11 DIAGNOSIS — I10 Essential (primary) hypertension: Secondary | ICD-10-CM | POA: Diagnosis not present

## 2020-07-11 DIAGNOSIS — R443 Hallucinations, unspecified: Secondary | ICD-10-CM | POA: Diagnosis present

## 2020-07-11 DIAGNOSIS — E119 Type 2 diabetes mellitus without complications: Secondary | ICD-10-CM | POA: Diagnosis not present

## 2020-07-11 DIAGNOSIS — J45909 Unspecified asthma, uncomplicated: Secondary | ICD-10-CM | POA: Insufficient documentation

## 2020-07-11 DIAGNOSIS — F0390 Unspecified dementia without behavioral disturbance: Secondary | ICD-10-CM

## 2020-07-11 HISTORY — DX: Unspecified dementia, unspecified severity, without behavioral disturbance, psychotic disturbance, mood disturbance, and anxiety: F03.90

## 2020-07-11 LAB — URINALYSIS, ROUTINE W REFLEX MICROSCOPIC
Bilirubin Urine: NEGATIVE
Glucose, UA: NEGATIVE mg/dL
Ketones, ur: NEGATIVE mg/dL
Leukocytes,Ua: NEGATIVE
Nitrite: NEGATIVE
Protein, ur: 100 mg/dL — AB
Specific Gravity, Urine: 1.025 (ref 1.005–1.030)
pH: 6.5 (ref 5.0–8.0)

## 2020-07-11 LAB — BASIC METABOLIC PANEL
Anion gap: 10 (ref 5–15)
BUN: 18 mg/dL (ref 8–23)
CO2: 30 mmol/L (ref 22–32)
Calcium: 8.9 mg/dL (ref 8.9–10.3)
Chloride: 97 mmol/L — ABNORMAL LOW (ref 98–111)
Creatinine, Ser: 1.2 mg/dL — ABNORMAL HIGH (ref 0.44–1.00)
GFR, Estimated: 44 mL/min — ABNORMAL LOW (ref >60)
Glucose, Bld: 128 mg/dL — ABNORMAL HIGH (ref 70–99)
Potassium: 3.7 mmol/L (ref 3.5–5.1)
Sodium: 137 mmol/L (ref 135–145)

## 2020-07-11 LAB — CBC WITH DIFFERENTIAL/PLATELET
Abs Immature Granulocytes: 0.02 10*3/uL (ref 0.00–0.07)
Basophils Absolute: 0.1 10*3/uL (ref 0.0–0.1)
Basophils Relative: 1 %
Eosinophils Absolute: 0.3 10*3/uL (ref 0.0–0.5)
Eosinophils Relative: 4 %
HCT: 40.8 % (ref 36.0–46.0)
Hemoglobin: 13 g/dL (ref 12.0–15.0)
Immature Granulocytes: 0 %
Lymphocytes Relative: 33 %
Lymphs Abs: 2 10*3/uL (ref 0.7–4.0)
MCH: 29.4 pg (ref 26.0–34.0)
MCHC: 31.9 g/dL (ref 30.0–36.0)
MCV: 92.3 fL (ref 80.0–100.0)
Monocytes Absolute: 0.6 10*3/uL (ref 0.1–1.0)
Monocytes Relative: 10 %
Neutro Abs: 3 10*3/uL (ref 1.7–7.7)
Neutrophils Relative %: 52 %
Platelets: 168 10*3/uL (ref 150–400)
RBC: 4.42 MIL/uL (ref 3.87–5.11)
RDW: 14.7 % (ref 11.5–15.5)
WBC: 5.9 10*3/uL (ref 4.0–10.5)
nRBC: 0 % (ref 0.0–0.2)

## 2020-07-11 LAB — URINALYSIS, MICROSCOPIC (REFLEX)

## 2020-07-11 MED ORDER — METOPROLOL TARTRATE 25 MG PO TABS
50.0000 mg | ORAL_TABLET | Freq: Once | ORAL | Status: AC
  Administered 2020-07-11: 50 mg via ORAL
  Filled 2020-07-11: qty 2

## 2020-07-11 MED ORDER — AMLODIPINE BESYLATE 5 MG PO TABS
5.0000 mg | ORAL_TABLET | Freq: Once | ORAL | Status: AC
  Administered 2020-07-11: 5 mg via ORAL
  Filled 2020-07-11: qty 1

## 2020-07-11 MED ORDER — LOSARTAN POTASSIUM 25 MG PO TABS
100.0000 mg | ORAL_TABLET | Freq: Once | ORAL | Status: AC
  Administered 2020-07-11: 100 mg via ORAL
  Filled 2020-07-11: qty 4

## 2020-07-11 MED ORDER — HYDRALAZINE HCL 20 MG/ML IJ SOLN
10.0000 mg | Freq: Once | INTRAMUSCULAR | Status: AC
  Administered 2020-07-11: 10 mg via INTRAVENOUS
  Filled 2020-07-11: qty 1

## 2020-07-11 MED ORDER — LABETALOL HCL 5 MG/ML IV SOLN: 20.0000 mg | Freq: Once | INTRAVENOUS | Status: DC

## 2020-07-11 NOTE — ED Notes (Signed)
Pt could not tolerate a larger cuff size. Attempted in arm as well as wrist. RN Roselyn Reef notified

## 2020-07-11 NOTE — ED Provider Notes (Signed)
[facility] Provider Note  CSN: [id number] Arrival date & time: 07/11/20 1422    History Chief Complaint  Patient presents with  . Dysuria    HPI  Nicole Doyle is a 87 y.o. female with history of dementia brought to the ED by daughter from ALF. Staff there called the daughter to tell her that the patient had been hallucinating and been more confused at night over the weekend. The patient apparently had been complaining of some dysuria and they were concerned about a UTI. Daughter tried to take a urine sample to PCP but was directed to bring the patient to the ED instead. She has not had a known fever. She reports occasional headaches but unable to describe location or severity. Denies CP, SOB, N/V/D. She has not been taking her medications consistently, daughter found her morning pills today wrapped in a napkin. She has some BLE edema, not new for her but seems worse recently.    Past Medical History:  Diagnosis Date  . Asthma   . Colon polyps   . Dementia ([place])   . Diabetes mellitus without complication (HCC)    Type 2  . Hypertension   . Lipoma of abdominal wall   . Stroke ([place])   . Thyroid disease    Radioactive iodine    Past Surgical History:  Procedure Laterality Date  . CAROTID ARTERY ANGIOPLASTY    . CARPAL TUNNEL RELEASE    . CHOLECYSTECTOMY    . HEMORRHOID SURGERY    . LIPOMA EXCISION      No family history on file.  Social History   Tobacco Use  . Smoking status: Never Smoker  . Smokeless tobacco: Never Used  Substance Use Topics  . Alcohol use: No  . Drug use: No     Home Medications Prior to Admission medications   Medication Sig Start Date End Date Taking? Authorizing Provider  amLODipine (NORVASC) 5 MG tablet Take by mouth. 12/18/16  Yes [provider]  donepezil (ARICEPT) 10 MG tablet Take by mouth. 12/30/19  Yes [provider]  folic acid (FOLVITE) 1 MG tablet Take 1 tablet by mouth daily.  12/30/19  Yes [provider]  gabapentin (NEURONTIN) 100 MG capsule Take by mouth. 07/31/18  Yes [provider]  memantine (NAMENDA) 5 MG tablet Take 1 Tab qHS for 7 days, 1 Tab BID for 7 days, 1 Tab in the am, 2 Tab qHS for 7 days then 2 Tabs BID 12/30/19  Yes [provider]  polyethylene glycol powder (GLYCOLAX/MIRALAX) 17 GM/SCOOP powder Take once daily with water for constipation- can increase to BID if necessary. PLEASE HOLD IF LOOSE STOOLS OR DIARRHEA 03/25/20  Yes [provider]  rosuvastatin (CRESTOR) 40 MG tablet Take by mouth. 08/06/16  Yes [provider]  terbinafine (LAMISIL) 1 % cream Apply 2 times a day 05/29/19  Yes [provider]  acetaminophen (TYLENOL) 500 MG tablet Take by mouth.    [provider]  aspirin EC 81 MG tablet Take 81 mg by mouth daily.    [provider]  clopidogrel (PLAVIX) 75 MG tablet Take 75 mg by mouth once.    [provider]  FLUoxetine (PROZAC) 10 MG tablet Take 10 mg by mouth daily.    [provider]  loperamide (IMODIUM) 2 MG capsule Take by mouth as needed for diarrhea or loose stools.    [provider]  loratadine (CLARITIN) 10 MG tablet Take 5 mg  by mouth 2 (two) times daily.    [provider]  losartan (COZAAR) 100 MG tablet Take 100 mg by mouth daily.    [provider]  metoprolol (LOPRESSOR) 50 MG tablet Take 50 mg by mouth 2 (two) times daily.    [provider]  montelukast (SINGULAIR) 10 MG tablet Take 10 mg by mouth at bedtime.    [provider]  Multiple Vitamin (MULTIVITAMIN) tablet Take 1 tablet by mouth daily.    [provider]  Olopatadine HCl (PATANASE) 0.6 % SOLN Place 1 each into the nose every morning.    [provider]  pantoprazole (PROTONIX) 40 MG tablet Take 40 mg by mouth daily.    [provider]  senna-docusate (SENOKOT-S) 8.6-50 MG tablet Take 1 tablet by mouth at  bedtime.    [provider]  budesonide-formoterol (SYMBICORT) 160-4.5 MCG/ACT inhaler Inhale 2 puffs into the lungs 2 (two) times daily.  07/11/20  [provider]  cloNIDine (CATAPRES) 0.1 MG tablet Take 0.1 mg by mouth daily.  07/11/20  [provider]  hydrochlorothiazide (MICROZIDE) 12.5 MG capsule Take 12.5 mg by mouth daily.  07/11/20  [provider]     Allergies    Darvon [propoxyphene], Fentanyl, Morphine, Morphine and related, and Penicillins   Review of Systems   Review of Systems Unable to assess due to mental status.   Physical Exam BP (!) 186/61   Pulse (!) 56   Temp 98.1 F (36.7 C)   Resp 17   Ht 5' (1.524 m)   Wt 86.2 kg   SpO2 97%   BMI 37.11 kg/m   Physical Exam Vitals and nursing note reviewed.  Constitutional:      Appearance: Normal appearance.  HENT:     Head: Normocephalic and atraumatic.     Nose: Nose normal.     Mouth/Throat:     Mouth: Mucous membranes are moist.  Eyes:     Extraocular Movements: Extraocular movements intact.     Conjunctiva/sclera: Conjunctivae normal.  Cardiovascular:     Rate and Rhythm: Normal rate.  Pulmonary:     Effort: Pulmonary effort is normal.     Breath sounds: Normal breath sounds.  Abdominal:     General: Abdomen is flat.     Palpations: Abdomen is soft.     Tenderness: There is no abdominal tenderness.  Musculoskeletal:        General: No swelling. Normal range of motion.     Cervical back: Neck supple.     Right lower leg: Edema present.     Left lower leg: Edema present.  Skin:    General: Skin is warm and dry.  Neurological:     General: No focal deficit present.     Mental Status: She is alert. She is disoriented.     Cranial Nerves: No cranial nerve deficit.     Sensory: No sensory deficit.     Motor: No weakness.  Psychiatric:        Mood and Affect: Mood normal.      ED Results / Procedures / Treatments   Labs (all labs ordered are listed, but only  abnormal results are displayed) Labs Reviewed  URINALYSIS, ROUTINE W REFLEX MICROSCOPIC - Abnormal; Notable for the following components:      Result Value   Hgb urine dipstick TRACE (*)    Protein, ur 100 (*)    All other components within normal limits  URINALYSIS, MICROSCOPIC (REFLEX) - Abnormal;  Notable for the following components:   Bacteria, UA RARE (*)    All other components within normal limits  BASIC METABOLIC PANEL - Abnormal; Notable for the following components:   Chloride 97 (*)    Glucose, Bld 128 (*)    Creatinine, Ser 1.20 (*)    GFR, Estimated 44 (*)    All other components within normal limits  URINE CULTURE  CBC WITH DIFFERENTIAL/PLATELET    EKG EKG Interpretation  Date/Time:  Monday July 11 2020 18:09:07 EDT Ventricular Rate:  65 PR Interval:    QRS Duration: 113 QT Interval:  488 QTC Calculation: 508 R Axis:   -35 Text Interpretation: Sinus rhythm Borderline prolonged PR interval Left ventricular hypertrophy Prolonged QT interval No significant change since last tracing Confirmed by Calvert Cantor [phone]) on 07/11/2020 7:06:11 PM    Radiology CT Head Wo Contrast  Result Date: 07/11/2020 CLINICAL DATA:  Mental status change EXAM: CT HEAD WITHOUT CONTRAST TECHNIQUE: Contiguous axial images were obtained from the base of the skull through the vertex without intravenous contrast. COMPARISON:  CT brain 11/28/2019 FINDINGS: Brain: No acute territorial infarction, hemorrhage or intracranial mass. Mild atrophy. Mild white matter hypodensity consistent with chronic small vessel ischemic change. Stable ventricle size. Vascular: No hyperdense vessels. Vertebral and carotid vascular calcification Skull: Normal. Negative for fracture or focal lesion. Sinuses/Orbits: No acute finding. Other: None IMPRESSION: 1. No CT evidence for acute intracranial abnormality. 2. Atrophy and mild chronic small vessel ischemic changes of the white matter. Electronically Signed   By:  Donavan Foil M.D.   On: 07/11/2020 17:50    Procedures Procedures  Medications Ordered in the ED Medications  hydrALAZINE (APRESOLINE) injection 10 mg (10 mg Intravenous Given 07/11/20 1807)  amLODipine (NORVASC) tablet 5 mg (5 mg Oral Given 07/11/20 1925)  losartan (COZAAR) tablet 100 mg (100 mg Oral Given 07/11/20 1927)  metoprolol tartrate (LOPRESSOR) tablet 50 mg (50 mg Oral Given 07/11/20 1926)     MDM Rules/Calculators/A&P MDM UA does not show any signs of infection. BP is markedly elevated today, concern for medication non-compliance. Will check basic labs, EKG and head CT to evaluate for signs of end organ damage or other causes of AMS. Hydralazine for BP control.   ED Course  I have reviewed the triage vital signs and the nursing notes.  Pertinent labs & imaging results that were available during my care of the patient were reviewed by me and considered in my medical decision making (see chart for details).  Clinical Course as of 07/11/20 2033  Mon Jul 11, 2020  1738 CBC is normal.  [CS]  1748 BP unremarkable.  [CS]  1909 CT head without acute process. BP is improving. Will give her missed doses of oral medications as well.  [CS]  2030 BP continues to improve, patient is otherwise asymptomatic. Patient and daughter are comfortable going home. Facility informed that patient is not consistent with taking her medications and asked to monitor her for compliance. RTED for any other concerns. [CS]    Clinical Course User Index [CS] Truddie Hidden, MD    Final Clinical Impression(s) / ED Diagnoses Final diagnoses:  Dementia without behavioral disturbance, unspecified dementia type ([place])  Hypertension, unspecified type  Noncompliance with medications    Rx / DC Orders ED Discharge Orders    None       Truddie Hidden, MD 07/11/20 2033

## 2020-07-11 NOTE — ED Notes (Signed)
ED Provider at bedside. 

## 2020-07-11 NOTE — ED Triage Notes (Signed)
Pt presents from Assisted living.   Family was told she was having some pain and burning with urination.  Pt has dementia but family states the assisted living told her she has been getting more confused at night.  Family attempted to go to PCP but they refused to see her and instructed pt to go to the ED.

## 2020-07-13 LAB — URINE CULTURE

## 2021-10-28 DEATH — deceased

## 2021-12-31 IMAGING — DX DG KNEE COMPLETE 4+V*R*
4 series · 4 of 4 positions shown · non-contrast
Comparison: None.

CLINICAL DATA: Fall, bilateral knee pain

EXAM:
RIGHT KNEE - COMPLETE 4+ VIEW

[knee ap]
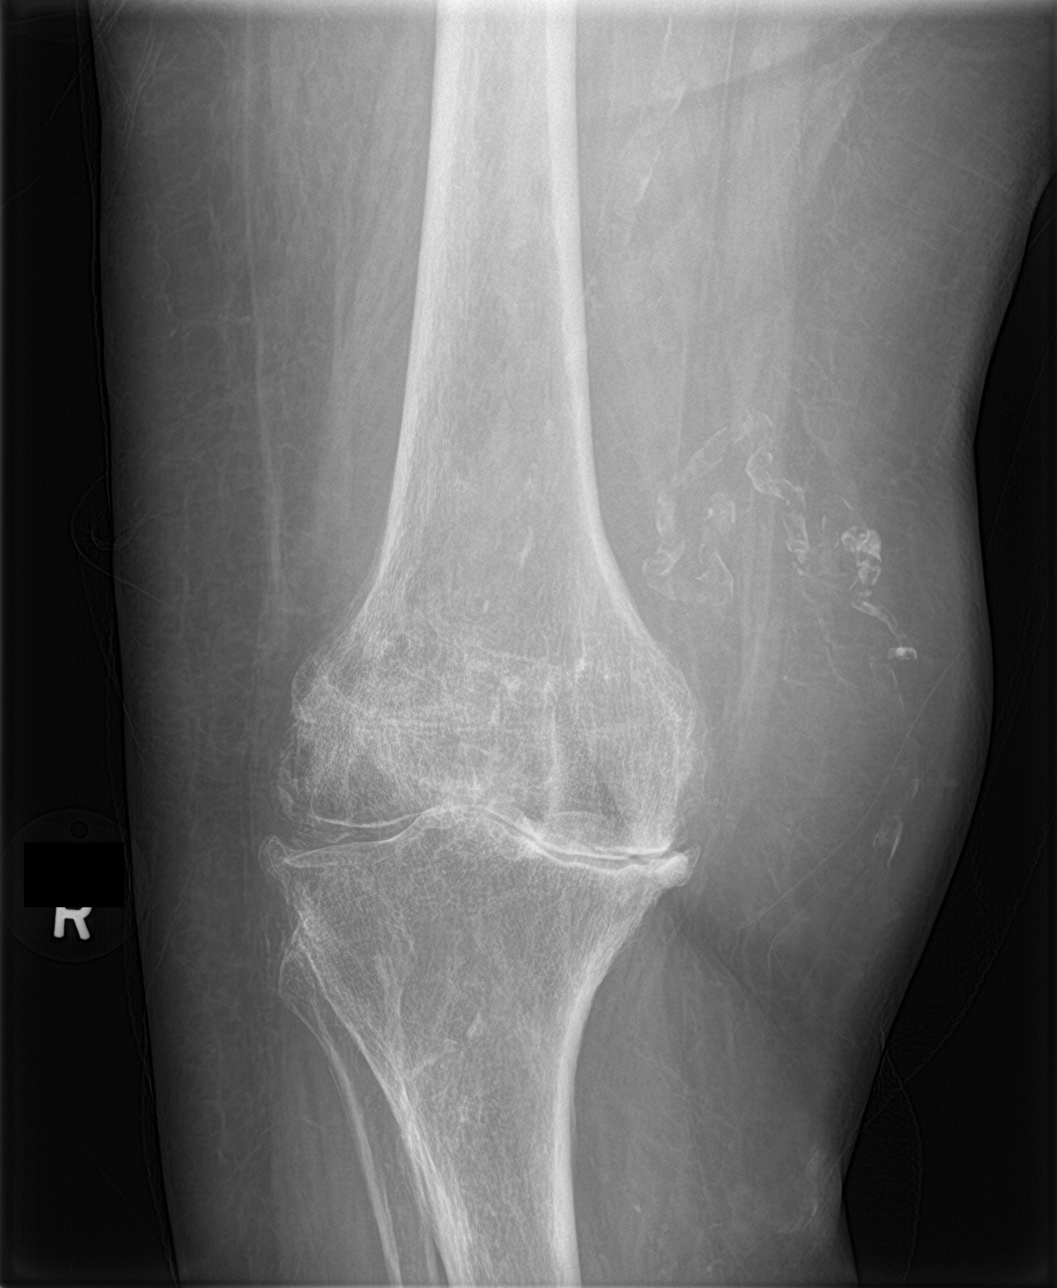

[knee lat]
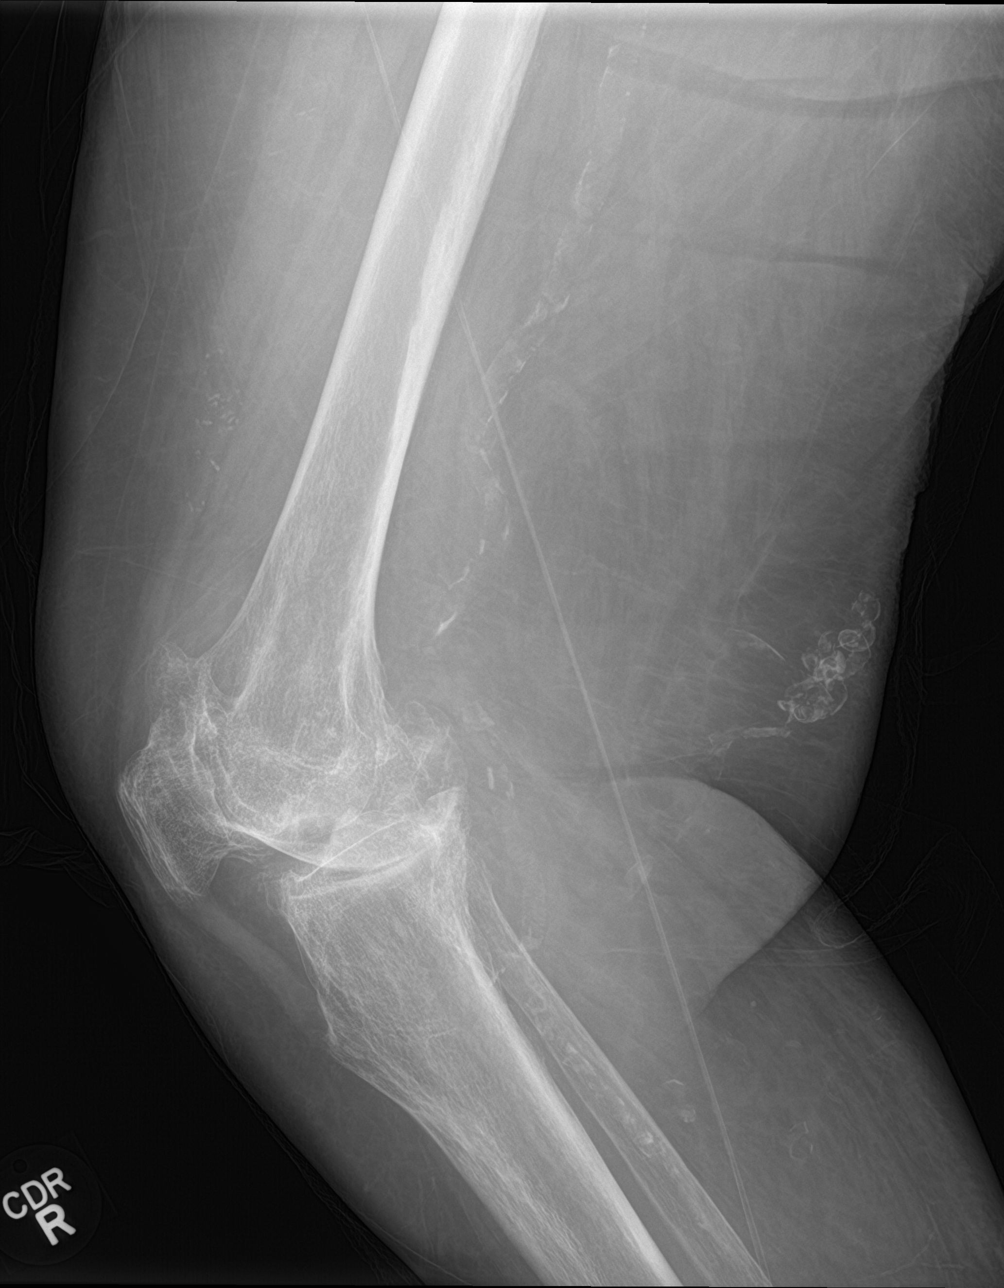

[knee obl (1 of 2)]
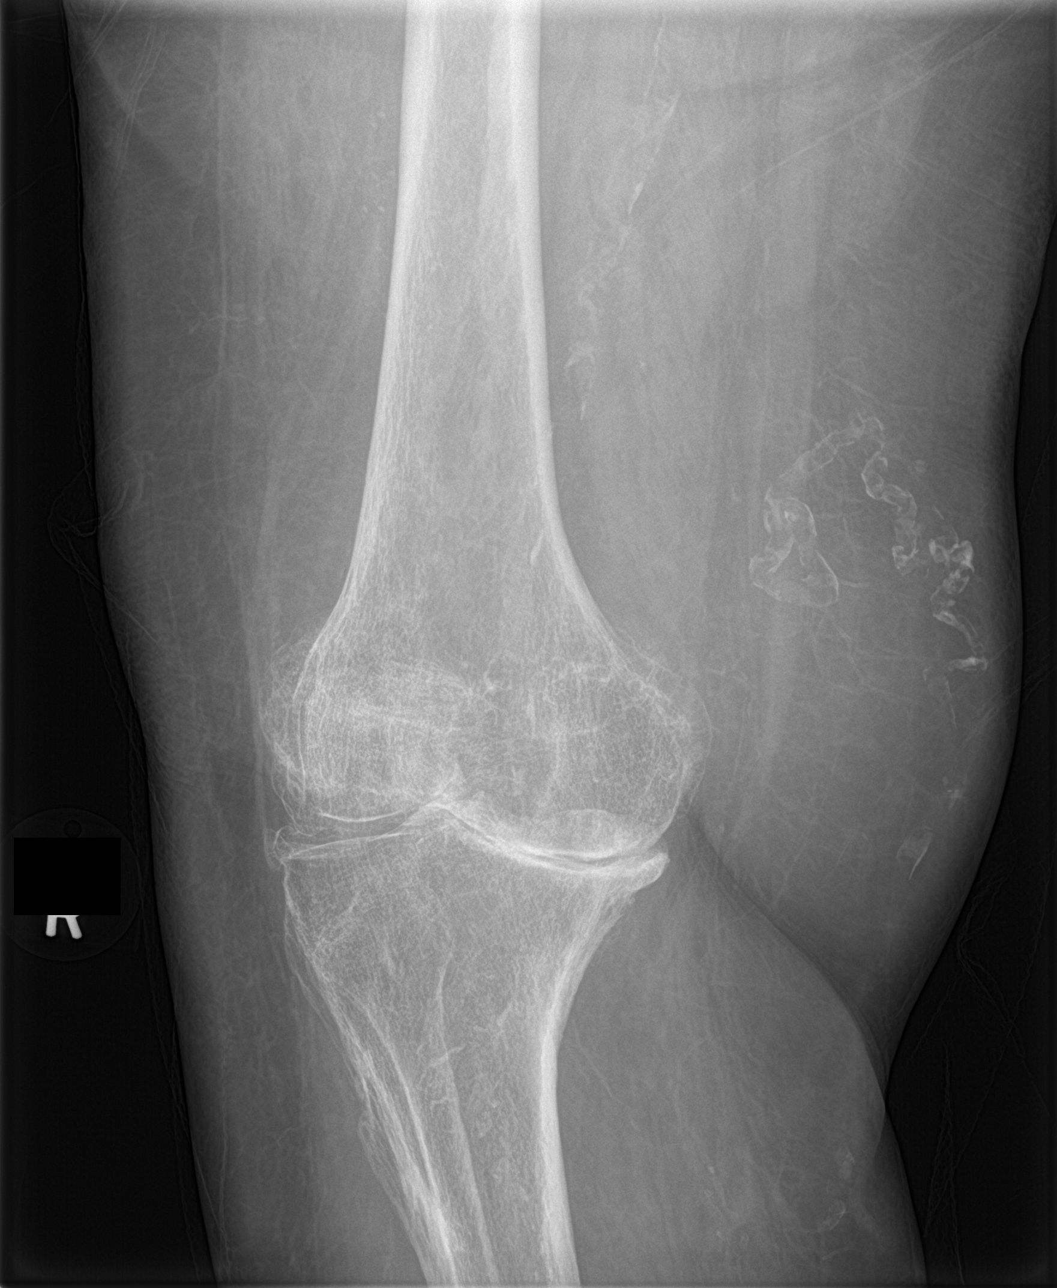

[knee obl (2 of 2)]
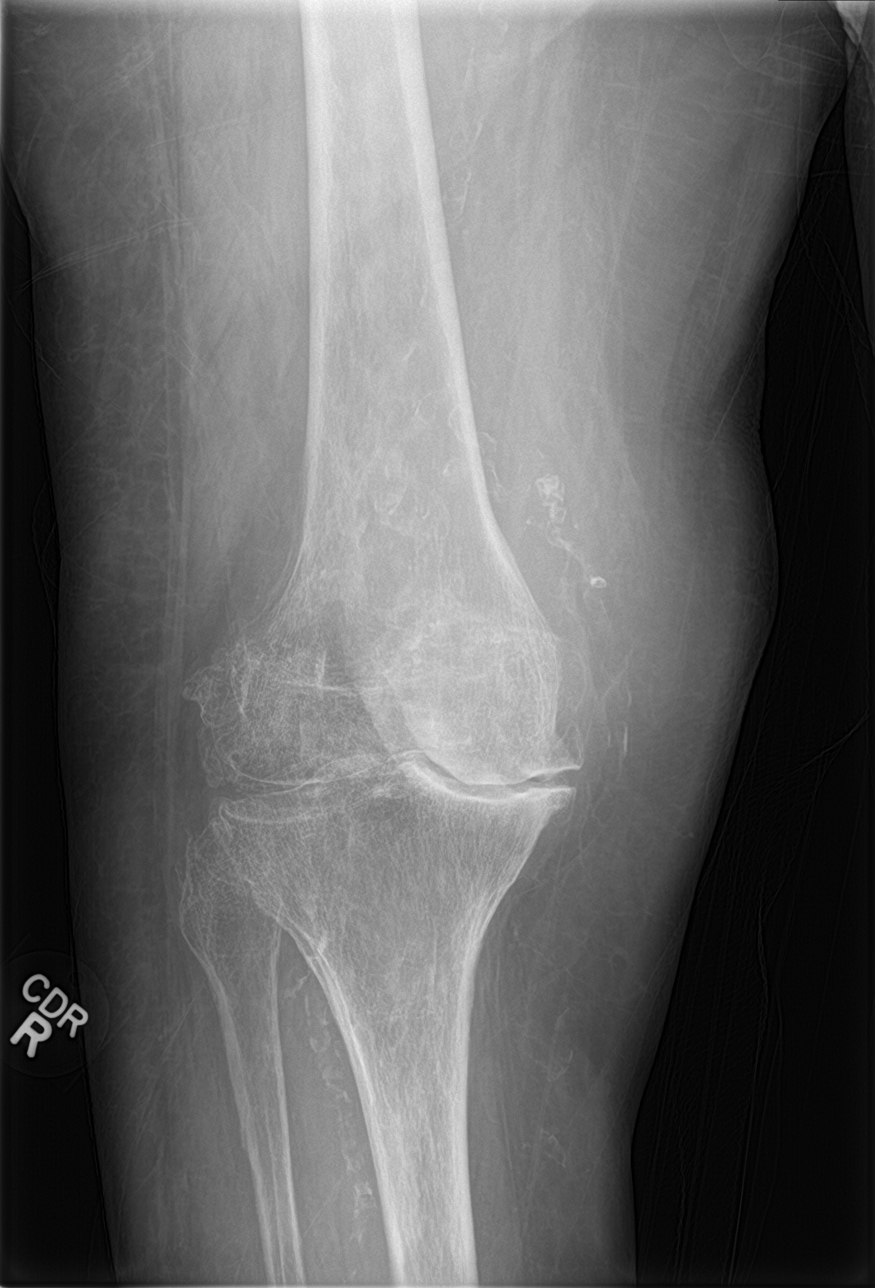

[4 of 4 positions shown; findings below may reference images not displayed]

FINDINGS: No clear acute fracture or traumatic malalignment though evaluation
limited by severe tricompartmental degenerative change and
underlying bony demineralization. Features are most pronounced in
the patellofemoral compartment with there is extensive periarticular
hypertrophic spurring as well as in the medial femorotibial
compartment where there is near complete joint effacement and more
advanced subchondral sclerotic changes. Some mild chondrocalcinosis
is visible suggesting underlying CPPD arthropathy as well. Extensive
vascular calcium seen in the soft tissues. No sizeable joint
effusion or focal swelling is seen with a background of mild
edematous change.
IMPRESSION: 1. No clear acute fracture or traumatic malalignment though
evaluation is limited by severe tricompartmental degenerative change
and diffuse bony demineralization.
2. Chondrocalcinosis suggestive of an underlying CPPD arthropathy.
3. Extensive atherosclerosis.

## 2021-12-31 IMAGING — CT CT CERVICAL SPINE W/O CM
3 of 5 series · 12 of 33 positions shown, 13 images · non-contrast
Comparison: CT head 06/06/2017, MRI 11/10/2019

CLINICAL DATA: Fall, no loss of consciousness or reported head
strike

EXAM:
CT HEAD WITHOUT CONTRAST
CT CERVICAL SPINE WITHOUT CONTRAST
TECHNIQUE: Multidetector CT imaging of the head and cervical spine was
performed following the standard protocol without intravenous
contrast. Multiplanar CT image reconstructions of the cervical spine
were also generated.

[Series 4: head 3.0 mpr cor · coronal · 0.33mm/px · 3 of 67 slices shown]
[im 14/67  bone]
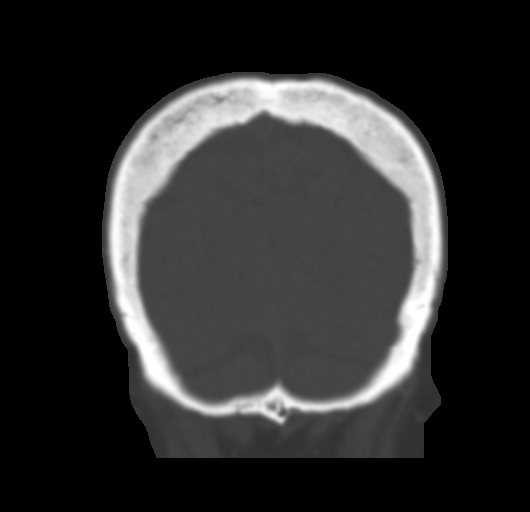
[im 27/67  bone]
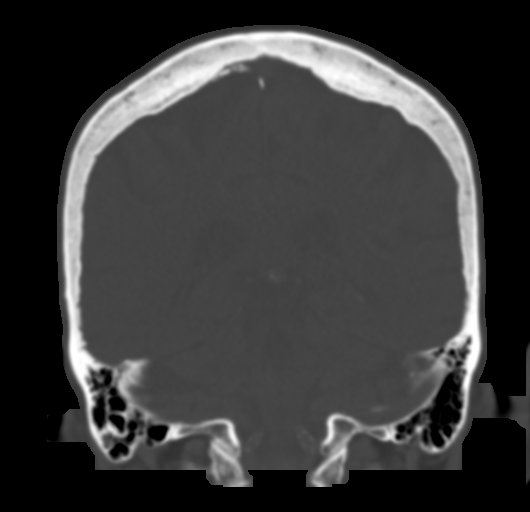
[im 40/67  bone]
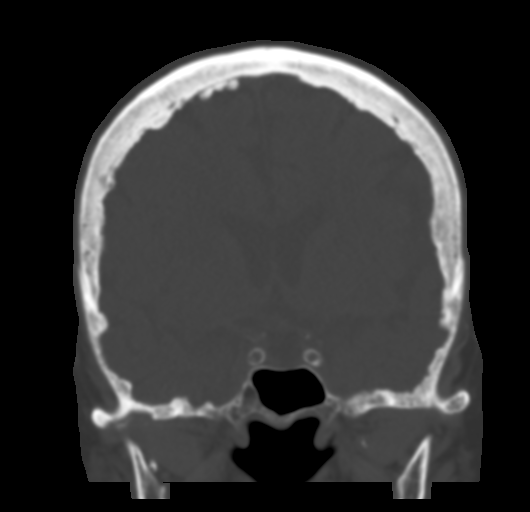

[Series 7: c_spine 2.0 i30s 3 · axial · 0.33mm/px · z∈[-258,-162]mm · 4 of 73 slices shown, 5 images]
[im 17/73  soft-tissue]
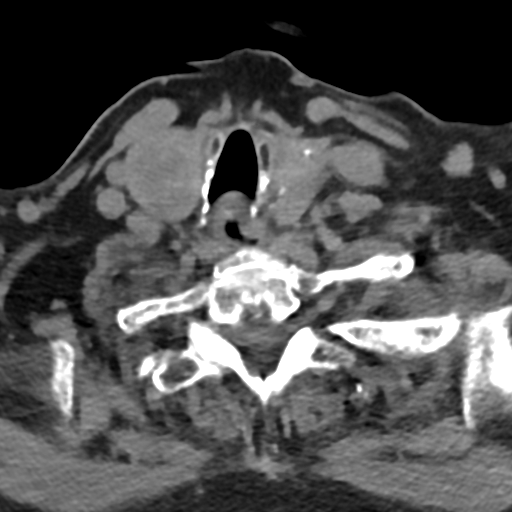
[im 17/73  bone]
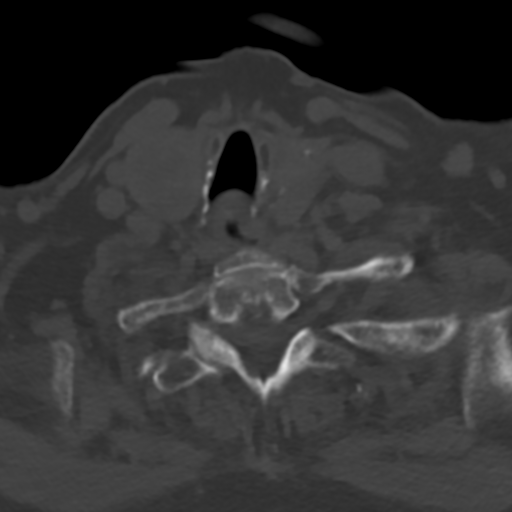
[im 33/73  bone]
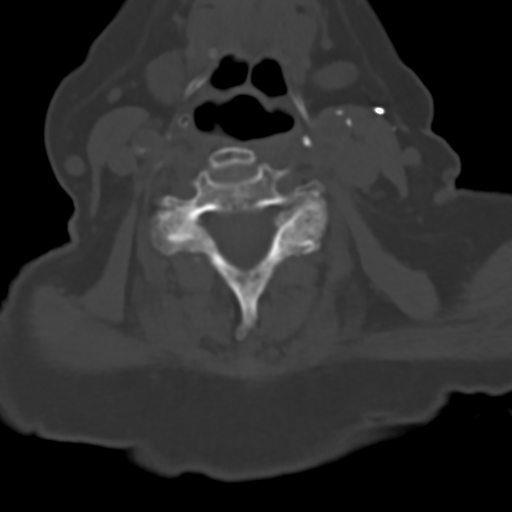
[im 49/73  bone]
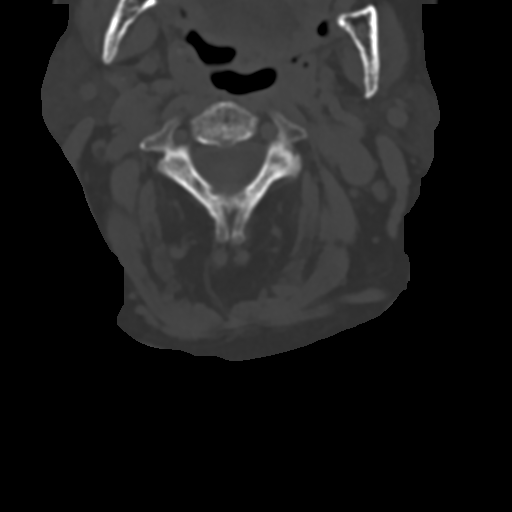
[im 65/73  bone]
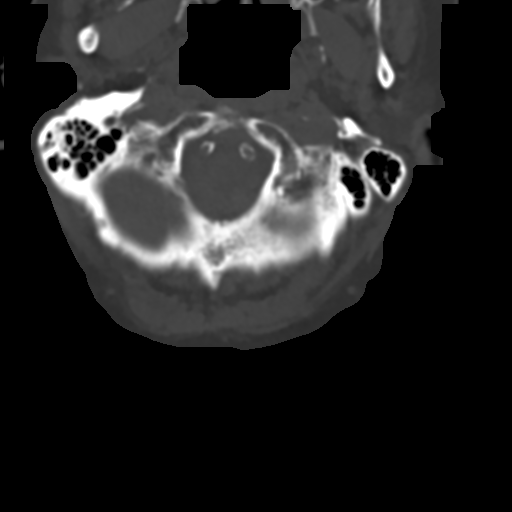

[Series 10: sagittals · sagittal · 0.32mm/px · 5 of 74 slices shown]
[im 13/74  bone]
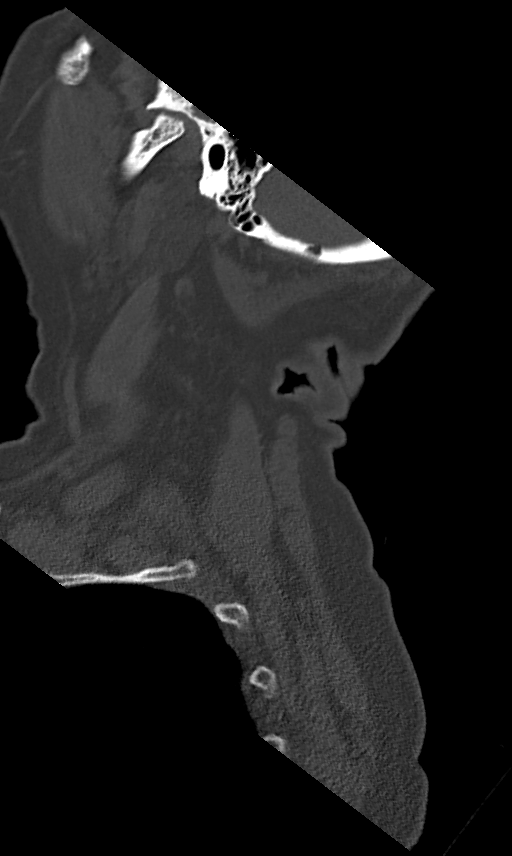
[im 25/74  bone]
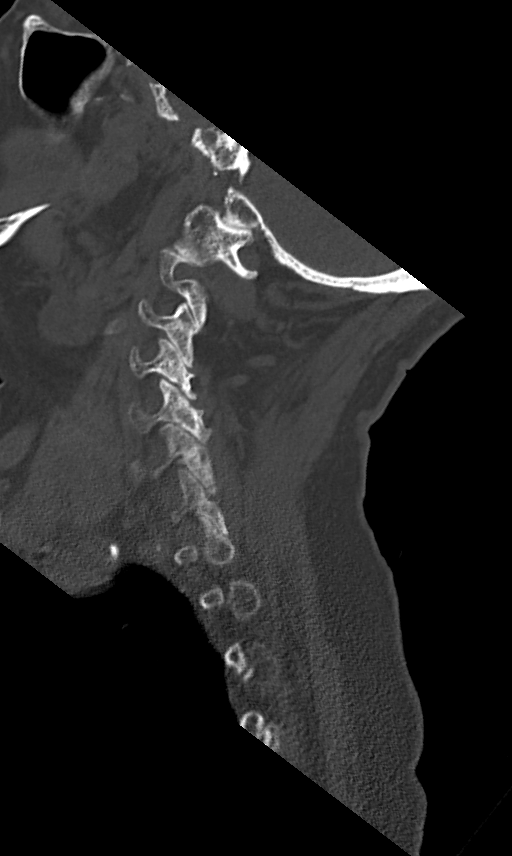
[im 37/74  bone]
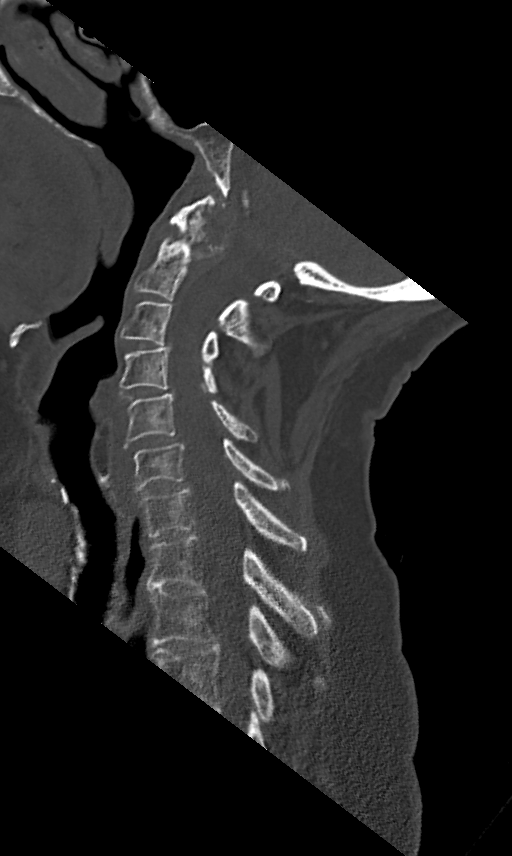
[im 49/74  bone]
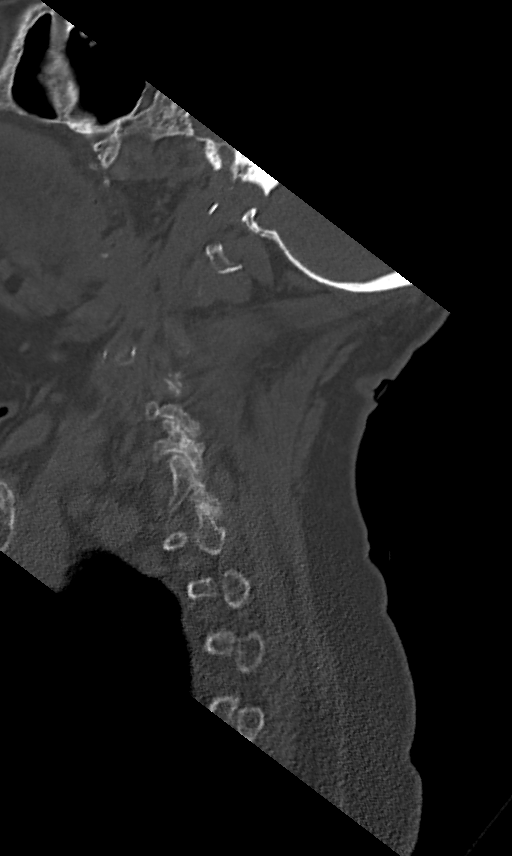
[im 61/74  bone]
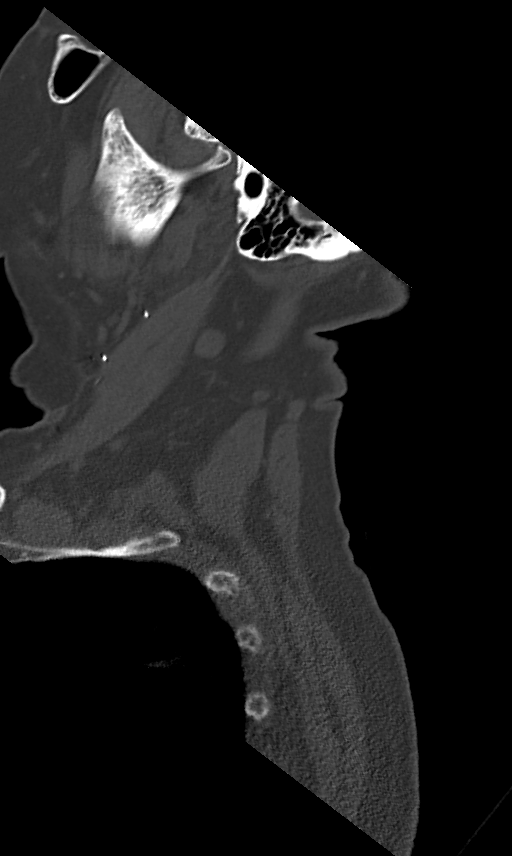

[12 of 33 positions shown; findings below may reference images not displayed]

FINDINGS: CT HEAD FINDINGS

Brain: In No evidence of acute infarction, hemorrhage,
hydrocephalus, extra-axial collection or mass lesion/mass effect.
Symmetric prominence of the ventricles, cisterns and sulci
compatible with parenchymal volume loss. Patchy areas of white
matter hypoattenuation are most compatible with chronic
microvascular angiopathy.

Vascular: Atherosclerotic calcification of the carotid siphons and
intradural vertebral arteries. No hyperdense vessel.

Skull: No calvarial fracture or suspicious osseous lesion. No scalp
swelling or hematoma. Benign hyperostosis frontalis interna.

Sinuses/Orbits: Paranasal sinuses and mastoid air cells are
predominantly clear. Orbital structures are unremarkable aside from
prior lens extractions.

Other: None

CT CERVICAL SPINE FINDINGS

Alignment: Preservation of the normal cervical lordosis. Stepwise
anterolisthesis C3-C7, likely on a degenerative basis with
multilevel cervical spondylitic and facet degenerative changes. No
evidence of traumatic listhesis, specifically no abnormally widened,
perched or jumped facets. Normal alignment of the craniocervical and
atlantoaxial articulations accounting for mild leftward cranial
rotation.

Skull base and vertebrae: The osseous structures appear diffusely
demineralized which may limit detection of small or nondisplaced
fractures. No acute skull base fracture. No vertebral body fracture
or height loss. Advanced arthrosis at the C1-2 articulation
including calcific pannus formation posterior to the dens. No
suspicious osseous lesions.

Soft tissues and spinal canal: Pannus formation posterior to the
dens, as above. No pre or paravertebral fluid or swelling. No
visible canal hematoma.

Disc levels: Multilevel stepwise anterolisthesis, cervical
spondylitic changes as well as facet hypertrophy and uncinate
spurring. Findings result in at most mild canal stenosis maximal at
the C4-5 level. Uncinate and facet degenerative changes result in
mild-to-moderate multilevel neural foraminal narrowing as well. No
severe canal stenosis or foraminal impingement.

Upper chest: No acute abnormality in the lung apices. Postsurgical
changes in the left neck may reflect prior endarterectomy. Extensive
atherosclerotic calcification in the proximal great vessels and
cervical carotids.

Other: Heterogeneous, multinodular thyroid gland including a larger
2.9 cm nodule in the right lobe thyroid.
IMPRESSION: 1. No acute intracranial abnormality. No scalp swelling or calvarial
fracture.
2. Chronic microvascular ischemic changes and parenchymal volume
loss.
3. No acute fracture or traumatic listhesis of the cervical spine.
4. Multilevel cervical spondylitic and facet degenerative changes.
5. Heterogeneous, multinodular thyroid gland including a larger
cm nodule in the right lobe thyroid. Decision for further imaging
should be based upon consideration of patient's comorbidities and
advanced age. This follows consensus guidelines: Managing Incidental
Thyroid Nodules Detected on Imaging: White Paper of [REDACTED]. [HOSPITAL] 7730;
[DATE]. and [employer] 3-tiered system for managing ITNs: [HOSPITAL]. [DATE]): 143-50
6. Cervical and intracranial atherosclerosis.
